# Patient Record
Sex: Female | Born: 1935 | Race: Black or African American | Hispanic: No | Smoking: Former smoker
Health system: Southern US, Community
[De-identification: ages and names within clinical notes are randomized; demographics above are authoritative.]

## PROBLEM LIST (undated history)

## (undated) DIAGNOSIS — M1990 Unspecified osteoarthritis, unspecified site: Secondary | ICD-10-CM

## (undated) DIAGNOSIS — M2040 Other hammer toe(s) (acquired), unspecified foot: Secondary | ICD-10-CM

## (undated) DIAGNOSIS — I493 Ventricular premature depolarization: Secondary | ICD-10-CM

## (undated) DIAGNOSIS — I10 Essential (primary) hypertension: Secondary | ICD-10-CM

## (undated) DIAGNOSIS — N952 Postmenopausal atrophic vaginitis: Secondary | ICD-10-CM

## (undated) DIAGNOSIS — N183 Chronic kidney disease, stage 3 unspecified: Secondary | ICD-10-CM

## (undated) DIAGNOSIS — K5900 Constipation, unspecified: Secondary | ICD-10-CM

## (undated) DIAGNOSIS — G609 Hereditary and idiopathic neuropathy, unspecified: Secondary | ICD-10-CM

## (undated) DIAGNOSIS — E785 Hyperlipidemia, unspecified: Secondary | ICD-10-CM

## (undated) DIAGNOSIS — N289 Disorder of kidney and ureter, unspecified: Secondary | ICD-10-CM

## (undated) DIAGNOSIS — I4510 Unspecified right bundle-branch block: Secondary | ICD-10-CM

## (undated) DIAGNOSIS — K2100 Gastro-esophageal reflux disease with esophagitis, without bleeding: Secondary | ICD-10-CM

## (undated) DIAGNOSIS — M949 Disorder of cartilage, unspecified: Secondary | ICD-10-CM

## (undated) DIAGNOSIS — M4010 Other secondary kyphosis, site unspecified: Secondary | ICD-10-CM

## (undated) DIAGNOSIS — D649 Anemia, unspecified: Secondary | ICD-10-CM

## (undated) DIAGNOSIS — M899 Disorder of bone, unspecified: Secondary | ICD-10-CM

## (undated) DIAGNOSIS — E669 Obesity, unspecified: Secondary | ICD-10-CM

## (undated) DIAGNOSIS — N189 Chronic kidney disease, unspecified: Secondary | ICD-10-CM

## (undated) DIAGNOSIS — N8111 Cystocele, midline: Secondary | ICD-10-CM

## (undated) DIAGNOSIS — E119 Type 2 diabetes mellitus without complications: Secondary | ICD-10-CM

## (undated) DIAGNOSIS — R630 Anorexia: Secondary | ICD-10-CM

## (undated) DIAGNOSIS — R32 Unspecified urinary incontinence: Secondary | ICD-10-CM

## (undated) HISTORY — DX: Other hammer toe(s) (acquired), unspecified foot: M20.40

## (undated) HISTORY — DX: Constipation, unspecified: K59.00

## (undated) HISTORY — DX: Chronic kidney disease, stage 3 (moderate): N18.3

## (undated) HISTORY — DX: Essential (primary) hypertension: I10

## (undated) HISTORY — DX: Chronic kidney disease, unspecified: N18.9

## (undated) HISTORY — DX: Unspecified osteoarthritis, unspecified site: M19.90

## (undated) HISTORY — DX: Unspecified right bundle-branch block: I45.10

## (undated) HISTORY — PX: TUBAL LIGATION: SHX77

## (undated) HISTORY — DX: Unspecified urinary incontinence: R32

## (undated) HISTORY — DX: Cystocele, midline: N81.11

## (undated) HISTORY — DX: Anorexia: R63.0

## (undated) HISTORY — DX: Hyperlipidemia, unspecified: E78.5

## (undated) HISTORY — DX: Disorder of bone, unspecified: M89.9

## (undated) HISTORY — DX: Gastro-esophageal reflux disease with esophagitis: K21.0

## (undated) HISTORY — DX: Gastro-esophageal reflux disease with esophagitis, without bleeding: K21.00

## (undated) HISTORY — DX: Postmenopausal atrophic vaginitis: N95.2

## (undated) HISTORY — DX: Chronic kidney disease, stage 3 unspecified: N18.30

## (undated) HISTORY — DX: Anemia, unspecified: D64.9

## (undated) HISTORY — DX: Ventricular premature depolarization: I49.3

## (undated) HISTORY — DX: Other secondary kyphosis, site unspecified: M40.10

## (undated) HISTORY — DX: Hereditary and idiopathic neuropathy, unspecified: G60.9

## (undated) HISTORY — DX: Obesity, unspecified: E66.9

## (undated) HISTORY — PX: BLADDER SURGERY: SHX569

## (undated) HISTORY — DX: Type 2 diabetes mellitus without complications: E11.9

## (undated) HISTORY — DX: Disorder of cartilage, unspecified: M94.9

## (undated) HISTORY — DX: Disorder of kidney and ureter, unspecified: N28.9

---

## 1999-02-16 ENCOUNTER — Other Ambulatory Visit: Admission: RE | Admit: 1999-02-16 | Discharge: 1999-02-16 | Payer: Self-pay | Admitting: *Deleted

## 2000-02-29 ENCOUNTER — Other Ambulatory Visit: Admission: RE | Admit: 2000-02-29 | Discharge: 2000-02-29 | Payer: Self-pay | Admitting: *Deleted

## 2002-12-30 ENCOUNTER — Other Ambulatory Visit: Admission: RE | Admit: 2002-12-30 | Discharge: 2002-12-30 | Payer: Self-pay | Admitting: Obstetrics & Gynecology

## 2003-12-06 ENCOUNTER — Inpatient Hospital Stay (HOSPITAL_COMMUNITY): Admission: EM | Admit: 2003-12-06 | Discharge: 2003-12-08 | Payer: Self-pay | Admitting: Emergency Medicine

## 2004-01-21 ENCOUNTER — Other Ambulatory Visit: Admission: RE | Admit: 2004-01-21 | Discharge: 2004-01-21 | Payer: Self-pay | Admitting: Obstetrics & Gynecology

## 2013-03-07 ENCOUNTER — Other Ambulatory Visit: Payer: Self-pay | Admitting: *Deleted

## 2013-03-07 DIAGNOSIS — I10 Essential (primary) hypertension: Secondary | ICD-10-CM

## 2013-03-07 DIAGNOSIS — E119 Type 2 diabetes mellitus without complications: Secondary | ICD-10-CM

## 2013-03-15 ENCOUNTER — Other Ambulatory Visit: Payer: Self-pay | Admitting: Nephrology

## 2013-03-18 ENCOUNTER — Other Ambulatory Visit: Payer: Medicare Other

## 2013-03-18 DIAGNOSIS — I10 Essential (primary) hypertension: Secondary | ICD-10-CM

## 2013-03-18 DIAGNOSIS — N183 Chronic kidney disease, stage 3 unspecified: Secondary | ICD-10-CM

## 2013-03-18 DIAGNOSIS — E119 Type 2 diabetes mellitus without complications: Secondary | ICD-10-CM

## 2013-03-19 LAB — CBC WITH DIFFERENTIAL/PLATELET
Basophils Absolute: 0 10*3/uL (ref 0.0–0.2)
Basos: 1 % (ref 0–3)
Eos: 3 % (ref 0–5)
Eosinophils Absolute: 0.2 10*3/uL (ref 0.0–0.4)
HCT: 33.1 % — ABNORMAL LOW (ref 34.0–46.6)
Hemoglobin: 11 g/dL — ABNORMAL LOW (ref 11.1–15.9)
Immature Grans (Abs): 0 10*3/uL (ref 0.0–0.1)
Immature Granulocytes: 0 % (ref 0–2)
Lymphocytes Absolute: 1.4 10*3/uL (ref 0.7–3.1)
Lymphs: 19 % (ref 14–46)
MCH: 27.4 pg (ref 26.6–33.0)
MCHC: 33.2 g/dL (ref 31.5–35.7)
MCV: 82 fL (ref 79–97)
Monocytes Absolute: 0.4 10*3/uL (ref 0.1–0.9)
Monocytes: 6 % (ref 4–12)
Neutrophils Absolute: 5.2 10*3/uL (ref 1.4–7.0)
Neutrophils Relative %: 71 % (ref 40–74)
RBC: 4.02 x10E6/uL (ref 3.77–5.28)
RDW: 14.9 % (ref 12.3–15.4)
WBC: 7.3 10*3/uL (ref 3.4–10.8)

## 2013-03-19 LAB — LIPID PANEL
Chol/HDL Ratio: 3 ratio units (ref 0.0–4.4)
Cholesterol, Total: 140 mg/dL (ref 100–199)
HDL: 46 mg/dL (ref >39)
LDL Calculated: 74 mg/dL (ref 0–99)
Triglycerides: 101 mg/dL (ref 0–149)
VLDL Cholesterol Cal: 20 mg/dL (ref 5–40)

## 2013-03-19 LAB — COMPREHENSIVE METABOLIC PANEL
ALT: 16 IU/L (ref 0–32)
AST: 23 IU/L (ref 0–40)
Albumin/Globulin Ratio: 1.4 (ref 1.1–2.5)
Albumin: 3.9 g/dL (ref 3.5–4.7)
Alkaline Phosphatase: 90 IU/L (ref 39–117)
BUN/Creatinine Ratio: 14 (ref 11–26)
BUN: 18 mg/dL (ref 8–27)
CO2: 24 mmol/L (ref 19–28)
Calcium: 9.3 mg/dL (ref 8.6–10.2)
Chloride: 103 mmol/L (ref 97–108)
Creatinine, Ser: 1.26 mg/dL — ABNORMAL HIGH (ref 0.57–1.00)
GFR calc Af Amer: 46 mL/min/{1.73_m2} — ABNORMAL LOW (ref >59)
GFR calc non Af Amer: 39 mL/min/{1.73_m2} — ABNORMAL LOW (ref >59)
Globulin, Total: 2.7 g/dL (ref 1.5–4.5)
Glucose: 100 mg/dL — ABNORMAL HIGH (ref 65–99)
Potassium: 4.4 mmol/L (ref 3.5–5.2)
Sodium: 141 mmol/L (ref 134–144)
Total Bilirubin: 0.3 mg/dL (ref 0.0–1.2)
Total Protein: 6.6 g/dL (ref 6.0–8.5)

## 2013-03-19 LAB — HEMOGLOBIN A1C
Est. average glucose Bld gHb Est-mCnc: 151 mg/dL
Hgb A1c MFr Bld: 6.9 % — ABNORMAL HIGH (ref 4.8–5.6)

## 2013-03-20 ENCOUNTER — Encounter: Payer: Self-pay | Admitting: Internal Medicine

## 2013-03-20 ENCOUNTER — Ambulatory Visit (INDEPENDENT_AMBULATORY_CARE_PROVIDER_SITE_OTHER): Payer: Medicare Other | Admitting: Internal Medicine

## 2013-03-20 VITALS — BP 130/78 | HR 82 | Temp 98.0°F | Resp 20 | Ht 66.0 in | Wt 177.0 lb

## 2013-03-20 DIAGNOSIS — M1990 Unspecified osteoarthritis, unspecified site: Secondary | ICD-10-CM

## 2013-03-20 DIAGNOSIS — E119 Type 2 diabetes mellitus without complications: Secondary | ICD-10-CM

## 2013-03-20 DIAGNOSIS — Z0000 Encounter for general adult medical examination without abnormal findings: Secondary | ICD-10-CM

## 2013-03-20 DIAGNOSIS — N189 Chronic kidney disease, unspecified: Secondary | ICD-10-CM | POA: Insufficient documentation

## 2013-03-20 DIAGNOSIS — E785 Hyperlipidemia, unspecified: Secondary | ICD-10-CM

## 2013-03-20 DIAGNOSIS — I10 Essential (primary) hypertension: Secondary | ICD-10-CM

## 2013-03-20 MED ORDER — SIMVASTATIN 5 MG PO TABS: 5.0000 mg | ORAL_TABLET | Freq: Every day | ORAL | Status: DC

## 2013-03-20 MED ORDER — POLYETHYLENE GLYCOL 3350 17 GM/SCOOP PO POWD: 17.0000 g | Freq: Every day | ORAL | Status: DC | PRN

## 2013-03-20 NOTE — Patient Instructions (Signed)
Your cholesterol medication dose has been decreased to 5 mg daily Stop taking tradjenta Taking miralax daily as needed for constipation Get blood work done 1 week prior to next lab work

## 2013-03-20 NOTE — Progress Notes (Signed)
Subjective:    Patient ID: Dawn [name], female    DOB: [birth year], 79 y.o.   MRN: [id number]  Chief Complaint  Patient presents with  . Annual Exam  . Hypertension  . Diabetes Mellitus   HPI  Pt here for her routine visit. Her cbg at home has been between 60-130. Denies any hypoglycemic episodes. Her bp has been under control. Has been seen by renal and pending renal usg. She had uti and is on last day on amoxicillin. Currently asymptomatic.  Has been having problem with bowel movement. On senna-s once a day Compliant with her medications Does not want any further pelvic and rectal exam or mammogram uptodate with immunization  Review of Systems  Constitutional: Negative for fever, appetite change and fatigue.  HENT: Negative for congestion.   Eyes: Negative for visual disturbance.  Respiratory: Negative for cough and shortness of breath.   Cardiovascular: Negative for chest pain, palpitations and leg swelling.  Gastrointestinal: Positive for constipation. Negative for abdominal pain and blood in stool.  Endocrine: Negative for cold intolerance, polydipsia, polyphagia and polyuria.  Genitourinary: Negative for dysuria, frequency and flank pain.  Musculoskeletal: Positive for arthralgias.  Skin: Negative for rash.  Neurological: Negative for dizziness, syncope, weakness and numbness.  Hematological: Negative for adenopathy.  Psychiatric/Behavioral: Negative for behavioral problems.   Allergies  Allergen Reactions  . Keflex (Cephalexin)   . Metronidazole    Past Medical History  Diagnosis Date  . Unspecified constipation   . Chronic kidney disease, stage III (moderate)   . Disorder of bone and cartilage, unspecified   . Other hammer toe (acquired)   . Anorexia   . Postmenopausal atrophic vaginitis   . Anemia, unspecified   . Reflux esophagitis   . Unspecified disorder of kidney and ureter   . Osteoarthrosis, unspecified whether generalized or localized, unspecified  site   . Other and unspecified hyperlipidemia   . Cystocele, midline   . Unspecified essential hypertension   . Type II or unspecified type diabetes mellitus without mention of complication, not stated as uncontrolled   . Unspecified urinary incontinence   . Unspecified hereditary and idiopathic peripheral neuropathy   . Obesity, unspecified   . Kyphosis associated with other condition   . Chronic kidney disease    Family History  Problem Relation Age of Onset  . Heart disease Mother   . Heart disease Sister   . Cancer Son   . Cancer Son    History   Social History  . Marital Status: Widowed    Spouse Name: N/A    Number of Children: N/A  . Years of Education: N/A   Occupational History  . Not on file.   Social History Main Topics  . Smoking status: Never Smoker   . Smokeless tobacco: Not on file  . Alcohol Use: No  . Drug Use: No  . Sexually Active: No   Other Topics Concern  . Not on file   Social History Narrative  . No narrative on file       Objective:   Physical Exam  Constitutional: She is oriented to person, place, and time. She appears well-developed and well-nourished. No distress.  HENT:  Head: Normocephalic and atraumatic.  Right Ear: External ear normal.  Left Ear: External ear normal.  Mouth/Throat: Oropharynx is clear and moist. No oropharyngeal exudate.  Eyes: Conjunctivae and EOM are normal. Pupils are equal, round, and reactive to light.  Neck: Normal range of motion. Neck supple. No thyromegaly  present.  Cardiovascular: Normal rate, regular rhythm, normal heart sounds and intact distal pulses.   Pulmonary/Chest: Effort normal and breath sounds normal.  Abdominal: Soft. Bowel sounds are normal. There is no tenderness.  Musculoskeletal: Normal range of motion. She exhibits no edema.  Lymphadenopathy:    She has no cervical adenopathy.  Neurological: She is alert and oriented to person, place, and time. She has normal reflexes. No cranial  nerve deficit.  Skin: Skin is warm and dry. No rash noted. She is not diaphoretic. No erythema.  Psychiatric: She has a normal mood and affect. Her behavior is normal.    BP 130/78  Pulse 82  Temp(Src) 98 F (36.7 C) (Oral)  Resp 20  Ht 5\' 6"  (1.676 m)  Wt 177 lb (80.287 kg)  BMI 28.58 kg/m2  SpO2 98%  LABS- Reviewed ekg, sinus rhythm, RBBB- same from 2009  CBC    Component Value Date/Time   WBC 7.3 03/18/2013 1003   RBC 4.02 03/18/2013 1003   HGB 11.0* 03/18/2013 1003   HCT 33.1* 03/18/2013 1003   MCV 82 03/18/2013 1003   MCH 27.4 03/18/2013 1003   MCHC 33.2 03/18/2013 1003   RDW 14.9 03/18/2013 1003   LYMPHSABS 1.4 03/18/2013 1003   EOSABS 0.2 03/18/2013 1003   BASOSABS 0.0 03/18/2013 1003   CMP     Component Value Date/Time   NA 141 03/18/2013 1003   K 4.4 03/18/2013 1003   CL 103 03/18/2013 1003   CO2 24 03/18/2013 1003   GLUCOSE 100* 03/18/2013 1003   BUN 18 03/18/2013 1003   CREATININE 1.26* 03/18/2013 1003   CALCIUM 9.3 03/18/2013 1003   PROT 6.6 03/18/2013 1003   AST 23 03/18/2013 1003   ALT 16 03/18/2013 1003   ALKPHOS 90 03/18/2013 1003   BILITOT 0.3 03/18/2013 1003   GFRNONAA 39* 03/18/2013 1003   GFRAA 46* 03/18/2013 1003      Assessment & Plan:   Chronic kidney disease- followed by renal, her longstanding htn and dm are likely cause. Has renal usg pending. Need to review renal notes post procedure  HTN- bp well controlled with current regimen. Monitor bp. No new meds changes this visit  DM- reviewed recent a1c and home cbg reading. Will disocntinue tradjenta for now. Continue cbg monitor. Recheck a1c prior to next visit. Goal a1c < 7 for her with her age. Continue asa, simvastatin and ACEI with glipizide  Constipation- continue senna-s once a day and will add miralax once a day as needed. Encouraged fiber intake and water consumption  Hyperlipidemia- lipid panel reviewed and ldl at goal now for several months. Will decrease simvastatin to 5 mg daily and maintain her on this dose.  Annual  physical- uptodate with flu and pneumococcal vaccine. No further mammogram or pelvic exam. Last mammogram 2013 normal.

## 2013-03-21 ENCOUNTER — Telehealth: Payer: Self-pay | Admitting: *Deleted

## 2013-03-21 DIAGNOSIS — E1121 Type 2 diabetes mellitus with diabetic nephropathy: Secondary | ICD-10-CM | POA: Insufficient documentation

## 2013-03-21 NOTE — Telephone Encounter (Signed)
Dawn [name]

## 2013-03-27 ENCOUNTER — Ambulatory Visit
Admission: RE | Admit: 2013-03-27 | Discharge: 2013-03-27 | Disposition: A | Discharge status: Home / Self Care | Payer: Medicare Other | Source: Ambulatory Visit | Attending: Nephrology | Admitting: Nephrology

## 2013-04-25 ENCOUNTER — Encounter: Payer: Self-pay | Admitting: Internal Medicine

## 2013-06-12 ENCOUNTER — Other Ambulatory Visit: Payer: Medicare Other

## 2013-06-12 ENCOUNTER — Other Ambulatory Visit: Payer: Self-pay | Admitting: Geriatric Medicine

## 2013-06-12 DIAGNOSIS — R350 Frequency of micturition: Secondary | ICD-10-CM

## 2013-06-12 DIAGNOSIS — I10 Essential (primary) hypertension: Secondary | ICD-10-CM

## 2013-06-12 DIAGNOSIS — E119 Type 2 diabetes mellitus without complications: Secondary | ICD-10-CM

## 2013-06-13 ENCOUNTER — Other Ambulatory Visit: Payer: Self-pay | Admitting: Geriatric Medicine

## 2013-06-13 LAB — BASIC METABOLIC PANEL
BUN/Creatinine Ratio: 17 (ref 11–26)
BUN: 21 mg/dL (ref 8–27)
CO2: 24 mmol/L (ref 18–29)
Calcium: 9.3 mg/dL (ref 8.6–10.2)
Chloride: 105 mmol/L (ref 97–108)
Creatinine, Ser: 1.23 mg/dL — ABNORMAL HIGH (ref 0.57–1.00)
GFR calc Af Amer: 47 mL/min/{1.73_m2} — ABNORMAL LOW (ref >59)
GFR calc non Af Amer: 41 mL/min/{1.73_m2} — ABNORMAL LOW (ref >59)
Glucose: 114 mg/dL — ABNORMAL HIGH (ref 65–99)
Potassium: 4.4 mmol/L (ref 3.5–5.2)
Sodium: 143 mmol/L (ref 134–144)

## 2013-06-13 LAB — URINALYSIS
Bilirubin, UA: NEGATIVE
Glucose, UA: NEGATIVE
Ketones, UA: NEGATIVE
Nitrite, UA: POSITIVE — AB
Protein, UA: NEGATIVE
Specific Gravity, UA: 1.016 (ref 1.005–1.030)
Urobilinogen, Ur: 0.2 mg/dL (ref 0.0–1.9)
pH, UA: 7 (ref 5.0–7.5)

## 2013-06-13 LAB — HEMOGLOBIN A1C
Est. average glucose Bld gHb Est-mCnc: 157 mg/dL
Hgb A1c MFr Bld: 7.1 % — ABNORMAL HIGH (ref 4.8–5.6)

## 2013-06-13 MED ORDER — SULFAMETHOXAZOLE-TRIMETHOPRIM 800-160 MG PO TABS: 1.0000 | ORAL_TABLET | Freq: Two times a day (BID) | ORAL | Status: DC

## 2013-06-14 LAB — URINE CULTURE

## 2013-06-18 ENCOUNTER — Encounter: Payer: Self-pay | Admitting: *Deleted

## 2013-06-18 ENCOUNTER — Other Ambulatory Visit: Payer: Self-pay | Admitting: Geriatric Medicine

## 2013-06-18 NOTE — Progress Notes (Signed)
Patient is taking Bactrim and will eat yogurt daily. Her blood sugar has been running low. Per Hassell Done, eat plenty of snacks throughout the day, skip insulin tonight, check blood sugar in the morning, and follow up with Dr. Bubba Camp during office visit tomorrow at 1:00.

## 2013-06-19 ENCOUNTER — Encounter: Payer: Self-pay | Admitting: Internal Medicine

## 2013-06-19 ENCOUNTER — Ambulatory Visit (INDEPENDENT_AMBULATORY_CARE_PROVIDER_SITE_OTHER): Payer: Medicare Other | Admitting: Internal Medicine

## 2013-06-19 VITALS — BP 134/72 | HR 73 | Temp 98.3°F | Resp 14 | Ht 66.0 in | Wt 174.4 lb

## 2013-06-19 DIAGNOSIS — M949 Disorder of cartilage, unspecified: Secondary | ICD-10-CM

## 2013-06-19 DIAGNOSIS — E1121 Type 2 diabetes mellitus with diabetic nephropathy: Secondary | ICD-10-CM

## 2013-06-19 DIAGNOSIS — N058 Unspecified nephritic syndrome with other morphologic changes: Secondary | ICD-10-CM

## 2013-06-19 DIAGNOSIS — M899 Disorder of bone, unspecified: Secondary | ICD-10-CM

## 2013-06-19 DIAGNOSIS — E119 Type 2 diabetes mellitus without complications: Secondary | ICD-10-CM

## 2013-06-19 DIAGNOSIS — I10 Essential (primary) hypertension: Secondary | ICD-10-CM

## 2013-06-19 DIAGNOSIS — E1129 Type 2 diabetes mellitus with other diabetic kidney complication: Secondary | ICD-10-CM

## 2013-06-19 DIAGNOSIS — E785 Hyperlipidemia, unspecified: Secondary | ICD-10-CM

## 2013-06-19 DIAGNOSIS — M1990 Unspecified osteoarthritis, unspecified site: Secondary | ICD-10-CM

## 2013-06-19 DIAGNOSIS — N390 Urinary tract infection, site not specified: Secondary | ICD-10-CM | POA: Insufficient documentation

## 2013-06-19 DIAGNOSIS — E162 Hypoglycemia, unspecified: Secondary | ICD-10-CM

## 2013-06-19 MED ORDER — SIMVASTATIN 5 MG PO TABS: 5.0000 mg | ORAL_TABLET | Freq: Every day | ORAL | Status: DC

## 2013-06-19 MED ORDER — GLIPIZIDE 5 MG PO TABS: 5.0000 mg | ORAL_TABLET | Freq: Two times a day (BID) | ORAL | Status: DC

## 2013-06-19 NOTE — Patient Instructions (Signed)
Please read this  If your sugar readings (check twice a day) are persistently < 70, please notify the office. Also check your blood pressure reading once a week and bring it to the office for review  Complete your course of antibiotic for your urinary tract infection    Hypoglycemia (Low Blood Sugar) Hypoglycemia is when the glucose (sugar) in your blood is too low. Hypoglycemia can happen for many reasons. It can happen to people with or without diabetes. Hypoglycemia can develop quickly and can be a medical emergency.  CAUSES  Having hypoglycemia does not mean that you will develop diabetes. Different causes include:  Missed or delayed meals or not enough carbohydrates eaten.  Medication overdose. This could be by accident or deliberate. If by accident, your medication may need to be adjusted or changed.  Exercise or increased activity without adjustments in carbohydrates or medications.  A nerve disorder that affects body functions like your heart rate, blood pressure and digestion (autonomic neuropathy).  A condition where the stomach muscles do not function properly (gastroparesis). Therefore, medications may not absorb properly.  The inability to recognize the signs of hypoglycemia (hypoglycemic unawareness).  Absorption of insulin  may be altered.  Alcohol consumption.  Pregnancy/menstrual cycles/postpartum. This may be due to hormones.  Certain kinds of tumors. This is very rare. SYMPTOMS   Sweating.  Hunger.  Dizziness.  Blurred vision.  Drowsiness.  Weakness.  Headache.  Rapid heart beat.  Shakiness.  Nervousness. DIAGNOSIS  Diagnosis is made by monitoring blood glucose in one or all of the following ways:  Fingerstick blood glucose monitoring.  Laboratory results. TREATMENT  If you think your blood glucose is low:  Check your blood glucose, if possible. If it is less than 70 mg/dl, take one of the following:  3-4 glucose tablets.   cup  juice (prefer clear like apple).   cup "regular" soda pop.  1 cup milk.  -1 tube of glucose gel.  5-6 hard candies.  Do not over treat because your blood glucose (sugar) will only go too high.  Wait 15 minutes and recheck your blood glucose. If it is still less than 70 mg/dl (or below your target range), repeat treatment.  Eat a snack if it is more than one hour until your next meal. Sometimes, your blood glucose may go so low that you are unable to treat yourself. You may need someone to help you. You may even pass out or be unable to swallow. This may require you to get an injection of glucagon, which raises the blood glucose. HOME CARE INSTRUCTIONS  Check blood glucose as recommended by your caregiver.  Take medication as prescribed by your caregiver.  Follow your meal plan. Do not skip meals. Eat on time.  If you are going to drink alcohol, drink it only with meals.  Check your blood glucose before driving.  Check your blood glucose before and after exercise. If you exercise longer or different than usual, be sure to check blood glucose more frequently.  Always carry treatment with you. Glucose tablets are the easiest to carry.  Always wear medical alert jewelry or carry some form of identification that states that you have diabetes. This will alert people that you have diabetes. If you have hypoglycemia, they will have a better idea on what to do. SEEK MEDICAL CARE IF:   You are having problems keeping your blood sugar at target range.  You are having frequent episodes of hypoglycemia.  You feel you might  be having side effects from your medicines.  You have symptoms of an illness that is not improving after 3-4 days.  You notice a change in vision or a new problem with your vision. SEEK IMMEDIATE MEDICAL CARE IF:   You are a family member or friend of a person whose blood glucose goes below 70 mg/dl and is accompanied by:  Confusion.  A change in mental  status.  The inability to swallow.  Passing out. Document Released: 11/28/2005 Document Revised: 02/20/2012 Document Reviewed: 03/26/2012 [facility] Patient Information 2014 [place], [place].

## 2013-06-19 NOTE — Progress Notes (Signed)
Patient ID: Dawn [name], female   DOB: [birth year], 79 y.o.   MRN: [id number]  Chief Complaint  Patient presents with  . Medical Managment of Chronic Issues    Complains of blood sugar running low    HPI  She had a recent E.coli UTI and has been started on bactrim ds 1 tab every 12 hours with florastor and stopped it yesterday with her being incoherent. EMS was called and checked her cbg to be 54. This morning again it was 52 and 49. She was given orange juice and sugar candy and then she had her breakfast and her sugar came up to 93. She had self resumed tradjenta 2 month back cbg at home between 90-160 and mostly between 90-100 recently  Daughter is here with her today and would like to know about her bp and her weight  Reviewed renal usg and recent labs with patient  Bowel movement has improved  Review of Systems  Constitutional: Negative for fever, appetite change and fatigue.  HENT: Negative for congestion.   Eyes: Negative for visual disturbance.  Respiratory: Negative for cough and shortness of breath.   Cardiovascular: Negative for chest pain, palpitations and leg swelling.  Gastrointestinal: Positive for constipation. Negative for abdominal pain and blood in stool.  Endocrine: Negative for cold intolerance, polydipsia, polyphagia and polyuria.  Genitourinary: Negative for dysuria, frequency and flank pain.  Musculoskeletal: Positive for arthralgias.  Skin: Negative for rash.  Neurological: Negative for dizziness, syncope, weakness and numbness.  Hematological: Negative for adenopathy.  Psychiatric/Behavioral: Negative for behavioral problems.   BP 134/72  Pulse 73  Temp(Src) 98.3 F (36.8 C) (Oral)  Resp 14  Ht 5\' 6"  (1.676 m)  Wt 174 lb 6.4 oz (79.107 kg)  BMI 28.16 kg/m2  Constitutional: She is oriented to person, place, and time. She appears well-developed and well-nourished. No distress.  HENT:   Head: Normocephalic and atraumatic.  Right Ear: External ear  normal.  Left Ear: External ear normal.   Mouth/Throat: Oropharynx is clear and moist. No oropharyngeal exudate.  Eyes: Conjunctivae and EOM are normal. Pupils are equal, round, and reactive to light.  Neck: Normal range of motion. Neck supple. No thyromegaly present.  Cardiovascular: Normal rate, regular rhythm, normal heart sounds and intact distal pulses.   Pulmonary/Chest: Effort normal and breath sounds normal.  Abdominal: Soft. Bowel sounds are normal. There is no tenderness.  Musculoskeletal: Normal range of motion. She exhibits no edema.  Lymphadenopathy:    She has no cervical adenopathy.  Neurological: She is alert and oriented to person, place, and time. She has normal reflexes. No cranial nerve deficit.  Skin: Skin is warm and dry. No rash noted. She is not diaphoretic. No erythema.  Psychiatric: She has a normal mood and affect. Her behavior is normal.    IMAGINGS-  03/15/13  RENAL/URINARY TRACT ULTRASOUND COMPLETE   Comparison:  None.   Findings:   Right Kidney:  No hydronephrosis is seen.  The right kidney measures 9.1 cm sagittally.  The parenchyma of the right kidney is echogenic consistent with chronic renal medical disease.  A small cyst is noted peripherally in the mid right kidney of 1.3 cm in maximum diameter.   Left Kidney:  No hydronephrosis is noted.  The left kidney measures 9.2 cm.  The parenchyma is echogenic consistent with fatty infiltration.  Two small cysts are present measuring 2.0 and 1.6 cm respectively.   Bladder:  The urinary bladder is unremarkable.   IMPRESSION:  1.  No hydronephrosis. 2.  Echogenic renal parenchyma consistent with chronic renal medical disease. 3.  Small renal cysts bilaterally.   CMP     Component Value Date/Time   NA 143 06/12/2013 0940   K 4.4 06/12/2013 0940   CL 105 06/12/2013 0940   CO2 24 06/12/2013 0940   GLUCOSE 114* 06/12/2013 0940   BUN 21 06/12/2013 0940   CREATININE 1.23* 06/12/2013 0940   CALCIUM 9.3  06/12/2013 0940   PROT 6.6 03/18/2013 1003   AST 23 03/18/2013 1003   ALT 16 03/18/2013 1003   ALKPHOS 90 03/18/2013 1003   BILITOT 0.3 03/18/2013 1003   GFRNONAA 41* 06/12/2013 0940   GFRAA 47* 06/12/2013 0940   a1c 7.1  ASSESSMENT/PLAN  HTN- bp well controlled.  Currently has no symptoms. Continue lisinopril-hctz 20-12.5 for now. Monitor bp at home and bring reading for review  uti- resume her antibiotics and complete a week course of septra for now. Encourage hydration for now  Hyperlipidemia- continue zocor for now. Refills provided  Chronic kidney disease- followed by renal, her longstanding htn and dm are likely cause. Renal usg s/o chronic kidney disease changes. Avoid NSAIDs  DM- reviewed recent a1c and home cbg reading. Recent hypoglycemia. Will stop tradjenta for now.  Continue cbg monitor. Goal a1c < 7 for her with her age. Continue asa, simvastatin and ACEI with glipizide  Constipation- continue senna-s once a day and miralax once a day as needed. Encouraged fiber intake and water consumption  Hyperlipidemia- lipid panel reviewed and ldl at goal now for several months. Will decrease simvastatin to 5 mg daily and maintain her on this dose.  OA- continue calcium vit d supplement. Will get her dexa scan

## 2013-06-20 ENCOUNTER — Other Ambulatory Visit: Payer: Self-pay | Admitting: Nurse Practitioner

## 2013-07-03 ENCOUNTER — Other Ambulatory Visit: Payer: Self-pay

## 2013-07-08 ENCOUNTER — Ambulatory Visit: Payer: Self-pay | Admitting: Pharmacotherapy

## 2013-07-23 ENCOUNTER — Other Ambulatory Visit: Payer: Self-pay | Admitting: Geriatric Medicine

## 2013-07-23 ENCOUNTER — Ambulatory Visit (INDEPENDENT_AMBULATORY_CARE_PROVIDER_SITE_OTHER): Payer: Medicare Other | Admitting: Internal Medicine

## 2013-07-23 ENCOUNTER — Encounter: Payer: Self-pay | Admitting: Internal Medicine

## 2013-07-23 VITALS — BP 124/80 | HR 75 | Temp 97.9°F | Resp 14 | Ht 66.0 in | Wt 176.6 lb

## 2013-07-23 DIAGNOSIS — M159 Polyosteoarthritis, unspecified: Secondary | ICD-10-CM | POA: Insufficient documentation

## 2013-07-23 DIAGNOSIS — E785 Hyperlipidemia, unspecified: Secondary | ICD-10-CM

## 2013-07-23 DIAGNOSIS — E1121 Type 2 diabetes mellitus with diabetic nephropathy: Secondary | ICD-10-CM

## 2013-07-23 DIAGNOSIS — E1129 Type 2 diabetes mellitus with other diabetic kidney complication: Secondary | ICD-10-CM

## 2013-07-23 DIAGNOSIS — N058 Unspecified nephritic syndrome with other morphologic changes: Secondary | ICD-10-CM

## 2013-07-23 DIAGNOSIS — I10 Essential (primary) hypertension: Secondary | ICD-10-CM

## 2013-07-23 MED ORDER — LISINOPRIL-HYDROCHLOROTHIAZIDE 20-12.5 MG PO TABS: 1.0000 | ORAL_TABLET | Freq: Every day | ORAL | Status: DC

## 2013-07-23 MED ORDER — TRAMADOL HCL 50 MG PO TABS: 50.0000 mg | ORAL_TABLET | Freq: Three times a day (TID) | ORAL | Status: DC | PRN

## 2013-07-23 MED ORDER — DESOXIMETASONE 0.05 % EX CREA: TOPICAL_CREAM | Freq: Two times a day (BID) | CUTANEOUS | Status: DC

## 2013-07-23 NOTE — Progress Notes (Signed)
Patient ID: Dawn [name], female   DOB: May 31, 1928, 79 y.o.   MRN: [id number]  Chief Complaint  Patient presents with  . Medical Managment of Chronic Issues   HPI- Pain in her bones and achiness in joints for few weeks Has stifness in her joints and feels she is moving slower. She feels like she could have a fall at times Taking ca-vit d supplement Denies dizziness or lightheadedness  bp appears stable  Bowel movement has been regular- improved from before. Takes senna and prune juice  Taking her glipizide. Blood sugar this am was 123. Yesterday was 95 in am and 208 in pm. Tolerating her medication well  Allergies  Allergen Reactions  . Keflex [Cephalexin]   . Metronidazole    ROS Denies fever or chills Denies nausea and vomiting Denies abdominal pain Denies any falls Denies numbness or tingling  BP 124/80  Pulse 75  Temp(Src) 97.9 F (36.6 C) (Oral)  Resp 14  Ht 5\' 6"  (1.676 m)  Wt 176 lb 9.6 oz (80.105 kg)  BMI 28.52 kg/m2  Constitutional: She is oriented to person, place, and time. She appears well-developed and well-nourished. No distress.   HENT:   Head: Normocephalic and atraumatic.  Neck: Normal range of motion. Neck supple. No thyromegaly present.   Cardiovascular: Normal rate, regular rhythm, normal heart sounds and intact distal pulses.    Pulmonary/Chest: Effort normal and breath sounds normal.   Abdominal: Soft. Bowel sounds are normal. There is no tenderness.  Musculoskeletal: She exhibits no edema. Wrist joint and should joint ROM limited with pain Lymphadenopathy:    She has no cervical adenopathy.  Neurological: She is alert and oriented to person, place, and time. She has normal reflexes. No cranial nerve deficit.   Skin: Skin is warm and dry. No rash noted. She is not diaphoretic. No erythema.  Psychiatric: She has a normal mood and affect. Her behavior is normal.    Assessment/plan  Generalized OA- will provide PT referral to help with ROM  exercises for now. Will have her on tramadol 50 mg every 8 hr as needed for pain  HTN- bp well controlled. Continue lisinopril-hctz 20-12.5 for now.   Hyperlipidemia- continue zocor for now  DM- Continue cbg monitor. Goal a1c < 7 for her with her age. Continue asa, simvastatin and ACEI with glipizide  Constipation- continue senna-s once a day and miralax once a day as needed. Encouraged fiber intake and water consumption

## 2013-09-20 ENCOUNTER — Encounter: Payer: Self-pay | Admitting: Podiatrist

## 2013-09-20 ENCOUNTER — Ambulatory Visit (INDEPENDENT_AMBULATORY_CARE_PROVIDER_SITE_OTHER): Payer: Medicare Other | Admitting: Podiatrist

## 2013-09-20 VITALS — BP 94/59 | HR 84 | Resp 20 | Ht 66.0 in

## 2013-09-20 DIAGNOSIS — E1149 Type 2 diabetes mellitus with other diabetic neurological complication: Secondary | ICD-10-CM

## 2013-09-20 DIAGNOSIS — B351 Tinea unguium: Secondary | ICD-10-CM

## 2013-09-20 DIAGNOSIS — E1142 Type 2 diabetes mellitus with diabetic polyneuropathy: Secondary | ICD-10-CM

## 2013-09-20 DIAGNOSIS — E1140 Type 2 diabetes mellitus with diabetic neuropathy, unspecified: Secondary | ICD-10-CM

## 2013-09-20 DIAGNOSIS — M79609 Pain in unspecified limb: Secondary | ICD-10-CM

## 2013-09-20 NOTE — Progress Notes (Signed)
MRN: [id number] Name: Dawn [name]  Sex: female Age: 79 y.o. DOB: 07-14-1928  Provider: Trudie Buckler P  Allergies: Keflex and Metronidazole   Chief Complaint  Patient presents with  . Nail Problem    "It's a follow up of my toenails.  My right big toe feels like it's ingrown."     HPI: Patient is 79 y.o. female who presents today for of diabetic foot and nail care. Patient states she's doing well however her right great toenail feels like it's ingrown. She also states she's unable to wear her diabetic shoes as it feels like it makes the right great toenail uncomfortable and tight  Past Medical History  Diagnosis Date  . Unspecified constipation   . Chronic kidney disease, stage III (moderate)   . Disorder of bone and cartilage, unspecified   . Other hammer toe (acquired)   . Anorexia   . Postmenopausal atrophic vaginitis   . Anemia, unspecified   . Reflux esophagitis   . Unspecified disorder of kidney and ureter   . Osteoarthrosis, unspecified whether generalized or localized, unspecified site   . Other and unspecified hyperlipidemia   . Cystocele, midline   . Unspecified essential hypertension   . Type II or unspecified type diabetes mellitus without mention of complication, not stated as uncontrolled   . Unspecified urinary incontinence   . Unspecified hereditary and idiopathic peripheral neuropathy   . Obesity, unspecified   . Kyphosis associated with other condition   . Chronic kidney disease        Medication List       This list is accurate as of: 09/20/13  5:14 PM.  Always use your most recent med list.               aspirin 81 MG tablet  Take 81 mg by mouth daily.     calcium-vitamin D 500-200 MG-UNIT per tablet  Commonly known as:  OSCAL WITH D  Take 1 tablet by mouth daily.     cholecalciferol 1000 UNITS tablet  Commonly known as:  VITAMIN D  Take 1,000 Units by mouth daily.     desoximetasone 0.05 % cream  Commonly known as:  TOPICORT   Apply topically 2 (two) times daily.     glipiZIDE 5 MG tablet  Commonly known as:  GLUCOTROL  TAKE 1 TABLET TWICE A DAY     lisinopril-hydrochlorothiazide 20-12.5 MG per tablet  Commonly known as:  PRINZIDE,ZESTORETIC  Take 1 tablet by mouth daily.     polyethylene glycol powder powder  Commonly known as:  GLYCOLAX/MIRALAX  Take 17 g by mouth daily as needed (constipation).     senna 8.6 MG Tabs tablet  Commonly known as:  SENOKOT  Take 1 tablet by mouth daily.     simvastatin 5 MG tablet  Commonly known as:  ZOCOR  Take 1 tablet (5 mg total) by mouth at bedtime.     traMADol 50 MG tablet  Commonly known as:  ULTRAM  Take 1 tablet (50 mg total) by mouth every 8 (eight) hours as needed for pain.     vitamin E 400 UNIT capsule  Take 400 Units by mouth daily.        No orders of the defined types were placed in this encounter.    History reviewed. No pertinent past surgical history.   History  Substance Use Topics  . Smoking status: Former Research scientist (life sciences)  . Smokeless tobacco: Not on file     Comment: quit  in 1970  . Alcohol Use: No    Family History  Problem Relation Age of Onset  . Heart disease Mother   . Heart disease Sister   . Cancer Son   . Cancer Son      Danley Danker Vitals:   09/20/13 1537  BP: 94/59  Pulse: 84  Resp: 20    Physical Exam  GENERAL APPEARANCE: Alert, conversant. Appropriately groomed. No acute distress.  VASCULAR: Pedal pulses palpable bilateral.  Capillary refill time is immediate to all digits,  Proximal to distal cooling it warm to warm.  Digital hair growth is present bilateral  NEUROLOGIC: sensation is decreased epicritically and protectively to 5.07 monofilament at 3/5 sites bilateral.  Light touch is decreased bilateral, vibratory sensation decreased bilateral MUSCULOSKELETAL: acceptable muscle strength, tone and stability bilateral.forefoot cavus deformity bilateral DERMATOLOGIC: skin color, texture, and turger are decreased.  No  preulcerative lesions are seen, no interdigital maceration noted.  No open lesions present.  Digital nails are symptomatic and painful 1 through 5 bilateral right hallux nail is incurvated on the medial nail border no redness no swelling no infection present the remainder of the nails are thick and discolored dystrophic elongated and clinically mycotic.   Assessment   Diabetes with painful mycotic nails  Plan    Debrided the patient's nails to the level of the nail bed without complication did a slant back procedure of the right hallux nail without anesthesia to be seen back in 3 months or as needed for followup.  Trudie Buckler DPM   Dawn [name] P, DPM

## 2013-09-20 NOTE — Patient Instructions (Signed)

## 2013-09-30 ENCOUNTER — Other Ambulatory Visit: Payer: Medicare Other

## 2013-09-30 DIAGNOSIS — I10 Essential (primary) hypertension: Secondary | ICD-10-CM

## 2013-09-30 DIAGNOSIS — E1121 Type 2 diabetes mellitus with diabetic nephropathy: Secondary | ICD-10-CM

## 2013-09-30 DIAGNOSIS — E785 Hyperlipidemia, unspecified: Secondary | ICD-10-CM

## 2013-09-30 DIAGNOSIS — M159 Polyosteoarthritis, unspecified: Secondary | ICD-10-CM

## 2013-10-01 LAB — CBC WITH DIFFERENTIAL/PLATELET
Eos: 2 %
HCT: 34.5 % (ref 34.0–46.6)
Hemoglobin: 10.7 g/dL — ABNORMAL LOW (ref 11.1–15.9)
Immature Granulocytes: 0 %
Lymphocytes Absolute: 1.5 10*3/uL (ref 0.7–3.1)
MCHC: 31 g/dL — ABNORMAL LOW (ref 31.5–35.7)
MCV: 84 fL (ref 79–97)
Monocytes Absolute: 0.5 10*3/uL (ref 0.1–0.9)
Neutrophils Absolute: 5.3 10*3/uL (ref 1.4–7.0)
RDW: 15.5 % — ABNORMAL HIGH (ref 12.3–15.4)
WBC: 7.5 10*3/uL (ref 3.4–10.8)

## 2013-10-01 LAB — COMPREHENSIVE METABOLIC PANEL
ALT: 16 IU/L (ref 0–32)
AST: 23 IU/L (ref 0–40)
CO2: 22 mmol/L (ref 18–29)
Calcium: 9.6 mg/dL (ref 8.6–10.2)
Potassium: 4.5 mmol/L (ref 3.5–5.2)
Sodium: 141 mmol/L (ref 134–144)

## 2013-10-01 LAB — CK: Total CK: 92 U/L (ref 24–173)

## 2013-10-01 LAB — LIPID PANEL
HDL: 53 mg/dL (ref >39)
Triglycerides: 78 mg/dL (ref 0–149)

## 2013-10-02 ENCOUNTER — Ambulatory Visit: Payer: Medicare Other | Admitting: Internal Medicine

## 2013-10-08 ENCOUNTER — Encounter: Payer: Self-pay | Admitting: Internal Medicine

## 2013-10-08 ENCOUNTER — Ambulatory Visit (INDEPENDENT_AMBULATORY_CARE_PROVIDER_SITE_OTHER): Payer: Medicare Other | Admitting: Internal Medicine

## 2013-10-08 VITALS — BP 122/70 | HR 84 | Temp 98.1°F | Resp 16 | Wt 175.2 lb

## 2013-10-08 DIAGNOSIS — K5900 Constipation, unspecified: Secondary | ICD-10-CM

## 2013-10-08 DIAGNOSIS — N189 Chronic kidney disease, unspecified: Secondary | ICD-10-CM

## 2013-10-08 DIAGNOSIS — M1990 Unspecified osteoarthritis, unspecified site: Secondary | ICD-10-CM

## 2013-10-08 DIAGNOSIS — E785 Hyperlipidemia, unspecified: Secondary | ICD-10-CM

## 2013-10-08 DIAGNOSIS — Z23 Encounter for immunization: Secondary | ICD-10-CM

## 2013-10-08 DIAGNOSIS — I129 Hypertensive chronic kidney disease with stage 1 through stage 4 chronic kidney disease, or unspecified chronic kidney disease: Secondary | ICD-10-CM

## 2013-10-08 DIAGNOSIS — E1122 Type 2 diabetes mellitus with diabetic chronic kidney disease: Secondary | ICD-10-CM | POA: Insufficient documentation

## 2013-10-08 DIAGNOSIS — E1129 Type 2 diabetes mellitus with other diabetic kidney complication: Secondary | ICD-10-CM

## 2013-10-08 DIAGNOSIS — N058 Unspecified nephritic syndrome with other morphologic changes: Secondary | ICD-10-CM

## 2013-10-08 DIAGNOSIS — E1165 Type 2 diabetes mellitus with hyperglycemia: Secondary | ICD-10-CM

## 2013-10-08 MED ORDER — MELOXICAM 7.5 MG PO TABS: 7.5000 mg | ORAL_TABLET | Freq: Every day | ORAL | Status: DC

## 2013-10-08 MED ORDER — SENNOSIDES-DOCUSATE SODIUM 8.6-50 MG PO TABS: 1.0000 | ORAL_TABLET | Freq: Every day | ORAL | Status: DC

## 2013-10-08 MED ORDER — LINAGLIPTIN 5 MG PO TABS: 5.0000 mg | ORAL_TABLET | Freq: Every day | ORAL | Status: DC

## 2013-10-08 NOTE — Patient Instructions (Signed)
Start taking your tradjenta for your blood sugar  Record sugar readings twice a week and bring it on your next visit  Start taking senna-s 1 tab daily to help with your bowel movement. Stop senna.

## 2013-10-08 NOTE — Progress Notes (Signed)
Patient ID: Dawn [name], female   DOB: 08-17-1928, 79 y.o.   MRN: [id number]  Chief Complaint  Patient presents with  . Medical Managment of Chronic Issues    2 month f/u Arthritis, DM, HTN with labs printed  . Immunizations    needs Tdap<10 yrs  . other    achy more than usual & moving slower/stiff    Allergies  Allergen Reactions  . Keflex [Cephalexin]   . Metronidazole    HPI- Her sugar reading reviewed.Taking her glipizide. Blood sugar this am was 123. Yesterday was 95 in am and 208 in pm. Tolerating her medication well bp appears stable Bowel movement- miralax has been helpful. Senna has not been helping Complaints of her body aching, mainly her hip and knee. She sometimes feels her legs might be giving away She received her influenza vaccine today  Review of Systems  Constitutional: Negative for fever, chills, weight loss, malaise/fatigue and diaphoresis.  HENT: Negative for congestion and tinnitus.   Eyes: Negative for blurred vision.  Respiratory: Negative for cough and shortness of breath.   Cardiovascular: Negative for chest pain, palpitations and leg swelling.  Gastrointestinal: Negative for heartburn, nausea, vomiting and abdominal pain.  Genitourinary: Negative for dysuria, urgency and flank pain.  Musculoskeletal: Positive for joint pain. Negative for myalgias.  Skin: Negative for itching and rash.  Neurological: Negative for dizziness, focal weakness, weakness and headaches.  Psychiatric/Behavioral: Negative for depression and memory loss. The patient does not have insomnia.    Past Medical History  Diagnosis Date  . Unspecified constipation   . Chronic kidney disease, stage III (moderate)   . Disorder of bone and cartilage, unspecified   . Other hammer toe (acquired)   . Anorexia   . Postmenopausal atrophic vaginitis   . Anemia, unspecified   . Reflux esophagitis   . Unspecified disorder of kidney and ureter   . Osteoarthrosis, unspecified whether  generalized or localized, unspecified site   . Other and unspecified hyperlipidemia   . Cystocele, midline   . Unspecified essential hypertension   . Type II or unspecified type diabetes mellitus without mention of complication, not stated as uncontrolled   . Unspecified urinary incontinence   . Unspecified hereditary and idiopathic peripheral neuropathy   . Obesity, unspecified   . Kyphosis associated with other condition   . Chronic kidney disease    History reviewed. No pertinent past surgical history.  Current Outpatient Prescriptions on File Prior to Visit  Medication Sig Dispense Refill  . aspirin 81 MG tablet Take 81 mg by mouth daily.      . calcium-vitamin D (OSCAL WITH D) 500-200 MG-UNIT per tablet Take 1 tablet by mouth daily.      . cholecalciferol (VITAMIN D) 1000 UNITS tablet Take 1,000 Units by mouth daily.      Marland Kitchen desoximetasone (TOPICORT) 0.05 % cream Apply topically 2 (two) times daily.  30 g  3  . glipiZIDE (GLUCOTROL) 5 MG tablet TAKE 1 TABLET TWICE A DAY  180 tablet  3  . lisinopril-hydrochlorothiazide (PRINZIDE,ZESTORETIC) 20-12.5 MG per tablet Take 1 tablet by mouth daily.  90 tablet  3  . polyethylene glycol powder (GLYCOLAX/MIRALAX) powder Take 17 g by mouth daily as needed (constipation).  3350 g  1  . simvastatin (ZOCOR) 5 MG tablet Take 1 tablet (5 mg total) by mouth at bedtime.  90 tablet  3  . vitamin E 400 UNIT capsule Take 400 Units by mouth daily.  No current facility-administered medications on file prior to visit.   Physical exam- BP 122/70  Pulse 84  Temp(Src) 98.1 F (36.7 C) (Oral)  Resp 16  Wt 175 lb 3.2 oz (79.47 kg)  BMI 28.29 kg/m2  SpO2 98%  Constitutional: She is oriented to person, place, and time. She appears well-developed and well-nourished. No distress.   HENT:   Head: Normocephalic and atraumatic.  Neck: Normal range of motion. Neck supple. No thyromegaly present.   Cardiovascular: Normal rate, regular rhythm, normal heart  sounds and intact distal pulses.    Pulmonary/Chest: Effort normal and breath sounds normal.   Abdominal: Soft. Bowel sounds are normal. There is no tenderness.  Musculoskeletal: She exhibits no edema. Wrist joint and should joint ROM limited with pain Lymphadenopathy:    She has no cervical adenopathy.  Neurological: She is alert and oriented to person, place, and time. She has normal reflexes. No cranial nerve deficit.   Skin: Skin is warm and dry. No rash noted. She is not diaphoretic. No erythema.  Psychiatric: She has a normal mood and affect. Her behavior is normal.   Labs- CBC    Component Value Date/Time   WBC 7.5 09/30/2013 0938   RBC 4.13 09/30/2013 0938   HGB 10.7* 09/30/2013 0938   HCT 34.5 09/30/2013 0938   MCV 84 09/30/2013 0938   MCH 25.9* 09/30/2013 0938   MCHC 31.0* 09/30/2013 0938   RDW 15.5* 09/30/2013 0938   LYMPHSABS 1.5 09/30/2013 0938   EOSABS 0.2 09/30/2013 0938   BASOSABS 0.0 09/30/2013 0938    CMP     Component Value Date/Time   NA 141 09/30/2013 0938   K 4.5 09/30/2013 0938   CL 97 09/30/2013 0938   CO2 22 09/30/2013 0938   GLUCOSE 158* 09/30/2013 0938   BUN 27 09/30/2013 0938   CREATININE 1.50* 09/30/2013 0938   CALCIUM 9.6 09/30/2013 0938   PROT 6.4 09/30/2013 0938   AST 23 09/30/2013 0938   ALT 16 09/30/2013 0938   ALKPHOS 96 09/30/2013 0938   BILITOT 0.5 09/30/2013 0938   GFRNONAA 32* 09/30/2013 0938   GFRAA 37* 09/30/2013 0938    Assessment/plan  DM type 2 with renal complication- Continue cbg monitor. Goal a1c < 7 for her with her age. Continue asa, simvastatin and ACEI. Restart tradjenta 5 mg daily with glipizide. Normal foot exam. Check urine microalbumin. Recheck a1c prior to next visit  Constipation- Encouraged fiber intake and water intake. Will d/c senna and have her on sennas with rn miralax for now. Reassess if no improvement  Generalized OA-  Will start her on meloxicam 7.5 mg po daily for now and reassess. Weight bearing  exercise encouraged  HTN- bp well controlled. Continue lisinopril-hctz 20-12.5 for now.   Hyperlipidemia- ldl at goal. continue zocor for now. Normal ck and lft

## 2013-10-10 ENCOUNTER — Encounter: Payer: Self-pay | Admitting: *Deleted

## 2013-10-22 ENCOUNTER — Other Ambulatory Visit: Payer: Self-pay | Admitting: *Deleted

## 2013-10-22 MED ORDER — MELOXICAM 7.5 MG PO TABS: 7.5000 mg | ORAL_TABLET | Freq: Every day | ORAL | Status: DC

## 2013-12-25 ENCOUNTER — Ambulatory Visit (INDEPENDENT_AMBULATORY_CARE_PROVIDER_SITE_OTHER): Payer: Medicare Other | Admitting: Podiatrist

## 2013-12-25 ENCOUNTER — Encounter: Payer: Self-pay | Admitting: Podiatrist

## 2013-12-25 VITALS — BP 125/66 | HR 83 | Resp 16

## 2013-12-25 DIAGNOSIS — B351 Tinea unguium: Secondary | ICD-10-CM

## 2013-12-25 DIAGNOSIS — M79609 Pain in unspecified limb: Secondary | ICD-10-CM

## 2013-12-25 NOTE — Patient Instructions (Signed)
Diabetes and Foot Care Diabetes may cause you to have problems because of poor blood supply (circulation) to your feet and legs. This may cause the skin on your feet to become thinner, break easier, and heal more slowly. Your skin may become dry, and the skin may peel and crack. You may also have nerve damage in your legs and feet causing decreased feeling in them. You may not notice minor injuries to your feet that could lead to infections or more serious problems. Taking care of your feet is one of the most important things you can do for yourself.  HOME CARE INSTRUCTIONS  Wear shoes at all times, even in the house. Do not go barefoot. Bare feet are easily injured.  Check your feet daily for blisters, cuts, and redness. If you cannot see the bottom of your feet, use a mirror or ask someone for help.  Wash your feet with warm water (do not use hot water) and mild soap. Then pat your feet and the areas between your toes until they are completely dry. Do not soak your feet as this can dry your skin.  Apply a moisturizing lotion or petroleum jelly (that does not contain alcohol and is unscented) to the skin on your feet and to dry, brittle toenails. Do not apply lotion between your toes.  Trim your toenails straight across. Do not dig under them or around the cuticle. File the edges of your nails with an emery board or nail file.  Do not cut corns or calluses or try to remove them with medicine.  Wear clean socks or stockings every day. Make sure they are not too tight. Do not wear knee-high stockings since they may decrease blood flow to your legs.  Wear shoes that fit properly and have enough cushioning. To break in new shoes, wear them for just a few hours a day. This prevents you from injuring your feet. Always look in your shoes before you put them on to be sure there are no objects inside.  Do not cross your legs. This may decrease the blood flow to your feet.  If you find a minor scrape,  cut, or break in the skin on your feet, keep it and the skin around it clean and dry. These areas may be cleansed with mild soap and water. Do not cleanse the area with peroxide, alcohol, or iodine.  When you remove an adhesive bandage, be sure not to damage the skin around it.  If you have a wound, look at it several times a day to make sure it is healing.  Do not use heating pads or hot water bottles. They may burn your skin. If you have lost feeling in your feet or legs, you may not know it is happening until it is too late.  Make sure your health care provider performs a complete foot exam at least annually or more often if you have foot problems. Report any cuts, sores, or bruises to your health care provider immediately. SEEK MEDICAL CARE IF:   You have an injury that is not healing.  You have cuts or breaks in the skin.  You have an ingrown nail.  You notice redness on your legs or feet.  You feel burning or tingling in your legs or feet.  You have pain or cramps in your legs and feet.  Your legs or feet are numb.  Your feet always feel cold. SEEK IMMEDIATE MEDICAL CARE IF:   There is increasing redness,   swelling, or pain in or around a wound.  There is a red line that goes up your leg.  Pus is coming from a wound.  You develop a fever or as directed by your health care provider.  You notice a bad smell coming from an ulcer or wound. Document Released: 11/25/2000 Document Revised: 07/31/2013 Document Reviewed: 05/07/2013 [employer] Patient Information 2014 [employer].  

## 2013-12-25 NOTE — Progress Notes (Signed)
HPI:  Patient presents today for follow up of foot and nail care. States the right great toe continues to be painful despite repeated efforts to trim out the painful medial side.  Objective:  Patients chart is reviewed.  Neurovascular status unchanged.  Pulses palpable bilateral 2/4 dp/ pt. Patients nails are thickened, discolored, distrophic, friable and brittle with yellow-brown discoloration. Patient subjectively relates they are painful with shoes and with ambulation of bilateral feet.  Assessment:  Symptomatic onychomycosis, ingrown nail right first medial side   Plan:  Discussed treatment options and alternatives.  The symptomatic toenails were debrided through manual an mechanical means without complication. We'll set her up an appointment in one month for a permanent phenol matrixectomy right hallux medial nail border.  Trudie Buckler, DPM

## 2014-01-01 ENCOUNTER — Other Ambulatory Visit: Payer: Self-pay | Admitting: Internal Medicine

## 2014-01-03 ENCOUNTER — Other Ambulatory Visit: Payer: Medicare Other

## 2014-01-03 DIAGNOSIS — E1129 Type 2 diabetes mellitus with other diabetic kidney complication: Secondary | ICD-10-CM

## 2014-01-03 DIAGNOSIS — E1165 Type 2 diabetes mellitus with hyperglycemia: Principal | ICD-10-CM

## 2014-01-03 DIAGNOSIS — IMO0002 Reserved for concepts with insufficient information to code with codable children: Secondary | ICD-10-CM

## 2014-01-04 LAB — BASIC METABOLIC PANEL
BUN/Creatinine Ratio: 14 (ref 11–26)
BUN: 20 mg/dL (ref 8–27)
CALCIUM: 9.2 mg/dL (ref 8.7–10.3)
CHLORIDE: 100 mmol/L (ref 97–108)
CO2: 22 mmol/L (ref 18–29)
CREATININE: 1.38 mg/dL — AB (ref 0.57–1.00)
GFR calc Af Amer: 41 mL/min/{1.73_m2} — ABNORMAL LOW (ref >59)
GFR calc non Af Amer: 35 mL/min/{1.73_m2} — ABNORMAL LOW (ref >59)
GLUCOSE: 120 mg/dL — AB (ref 65–99)
Potassium: 4.6 mmol/L (ref 3.5–5.2)
Sodium: 140 mmol/L (ref 134–144)

## 2014-01-04 LAB — HEMOGLOBIN A1C
ESTIMATED AVERAGE GLUCOSE: 148 mg/dL
HEMOGLOBIN A1C: 6.8 % — AB (ref 4.8–5.6)

## 2014-01-06 ENCOUNTER — Other Ambulatory Visit: Payer: Medicare Other

## 2014-01-06 NOTE — Progress Notes (Signed)
appt 01/08/14 @ 2:00

## 2014-01-08 ENCOUNTER — Encounter: Payer: Self-pay | Admitting: Internal Medicine

## 2014-01-08 ENCOUNTER — Ambulatory Visit (INDEPENDENT_AMBULATORY_CARE_PROVIDER_SITE_OTHER): Payer: Medicare Other | Admitting: Internal Medicine

## 2014-01-08 VITALS — BP 124/72 | HR 47 | Temp 97.6°F | Resp 10 | Ht 65.0 in | Wt 177.0 lb

## 2014-01-08 DIAGNOSIS — M159 Polyosteoarthritis, unspecified: Secondary | ICD-10-CM

## 2014-01-08 DIAGNOSIS — K5900 Constipation, unspecified: Secondary | ICD-10-CM

## 2014-01-08 DIAGNOSIS — I10 Essential (primary) hypertension: Secondary | ICD-10-CM

## 2014-01-08 DIAGNOSIS — M79606 Pain in leg, unspecified: Secondary | ICD-10-CM | POA: Insufficient documentation

## 2014-01-08 DIAGNOSIS — E1165 Type 2 diabetes mellitus with hyperglycemia: Principal | ICD-10-CM

## 2014-01-08 DIAGNOSIS — E785 Hyperlipidemia, unspecified: Secondary | ICD-10-CM

## 2014-01-08 DIAGNOSIS — E1129 Type 2 diabetes mellitus with other diabetic kidney complication: Secondary | ICD-10-CM

## 2014-01-08 DIAGNOSIS — Z0000 Encounter for general adult medical examination without abnormal findings: Secondary | ICD-10-CM

## 2014-01-08 DIAGNOSIS — M79609 Pain in unspecified limb: Secondary | ICD-10-CM

## 2014-01-08 DIAGNOSIS — IMO0002 Reserved for concepts with insufficient information to code with codable children: Secondary | ICD-10-CM

## 2014-01-08 MED ORDER — SIMVASTATIN 5 MG PO TABS: 5.0000 mg | ORAL_TABLET | Freq: Every day | ORAL | Status: DC

## 2014-01-08 MED ORDER — GABAPENTIN 100 MG PO CAPS: ORAL_CAPSULE | ORAL | Status: DC

## 2014-01-08 MED ORDER — TETANUS-DIPHTH-ACELL PERTUSSIS 5-2.5-18.5 LF-MCG/0.5 IM SUSP: 0.5000 mL | Freq: Once | INTRAMUSCULAR | Status: DC

## 2014-01-08 MED ORDER — LINACLOTIDE 145 MCG PO CAPS: 145.0000 ug | ORAL_CAPSULE | Freq: Every day | ORAL | Status: DC

## 2014-01-08 NOTE — Progress Notes (Signed)
Patient ID: Dawn [name], female   DOB: 09-01-1928, 80 y.o.   MRN: [id number]    Chief Complaint  Patient presents with  . Annual Exam    Yearly check-up and discuss labs   . Leg Problem    Ongoing leg pain, patient unable to stands for long periods of time . Mobic not working   . Hand Problem    Off/on hand cramping, more frequently with in the last couple months    Allergies  Allergen Reactions  . Keflex [Cephalexin]   . Metronidazole     HPI 80 y/o female pt is here for her annual visit. She complaints of pain in both her legs after standing for some time. She also has cramps. She has pain in her hips.  Reviewed her cbg from home 77-144 bp appears stable She is constipated. Senna has not been helpful.   Review of Systems  Constitutional: Negative for fever, chills, weight loss, malaise/fatigue and diaphoresis.  HENT: Negative for congestion and tinnitus.   Eyes: Negative for blurred vision.  Respiratory: Negative for cough and shortness of breath.   Cardiovascular: Negative for chest pain, palpitations and leg swelling.  Gastrointestinal: Negative for heartburn, nausea, vomiting and abdominal pain.  Genitourinary: Negative for dysuria, urgency and flank pain. Has nocturia Musculoskeletal: Positive for joint pain. Negative for myalgias.  Skin: Negative for itching and rash.  Neurological: Negative for dizziness, focal weakness, weakness and headaches.  Psychiatric/Behavioral: Negative for depression and memory loss. The patient does not have insomnia.    Past Medical History  Diagnosis Date  . Unspecified constipation   . Chronic kidney disease, stage III (moderate)   . Disorder of bone and cartilage, unspecified   . Other hammer toe (acquired)   . Anorexia   . Postmenopausal atrophic vaginitis   . Anemia, unspecified   . Reflux esophagitis   . Unspecified disorder of kidney and ureter   . Osteoarthrosis, unspecified whether generalized or localized, unspecified site     . Other and unspecified hyperlipidemia   . Cystocele, midline   . Unspecified essential hypertension   . Type II or unspecified type diabetes mellitus without mention of complication, not stated as uncontrolled   . Unspecified urinary incontinence   . Unspecified hereditary and idiopathic peripheral neuropathy   . Obesity, unspecified   . Kyphosis associated with other condition   . Chronic kidney disease    History reviewed. No pertinent past surgical history. Current Outpatient Prescriptions on File Prior to Visit  Medication Sig Dispense Refill  . aspirin 81 MG tablet Take 81 mg by mouth daily.      . calcium-vitamin D (OSCAL WITH D) 500-200 MG-UNIT per tablet Take 1 tablet by mouth daily.      . cholecalciferol (VITAMIN D) 1000 UNITS tablet Take 1,000 Units by mouth daily.      Marland Kitchen desoximetasone (TOPICORT) 0.05 % cream Apply topically 2 (two) times daily.  30 g  3  . glipiZIDE (GLUCOTROL) 5 MG tablet TAKE 1 TABLET TWICE A DAY  180 tablet  3  . linagliptin (TRADJENTA) 5 MG TABS tablet Take 1 tablet (5 mg total) by mouth daily.  90 tablet  3  . lisinopril-hydrochlorothiazide (PRINZIDE,ZESTORETIC) 20-12.5 MG per tablet Take 1 tablet by mouth daily.  90 tablet  3  . meloxicam (MOBIC) 7.5 MG tablet TAKE 1 TABLET DAILY  90 tablet  0  . polyethylene glycol powder (GLYCOLAX/MIRALAX) powder Take 17 g by mouth daily as needed (constipation).  3350 g  1  . senna-docusate (SENOKOT-S) 8.6-50 MG per tablet Take 1 tablet by mouth daily.  30 tablet  3  . simvastatin (ZOCOR) 5 MG tablet Take 1 tablet (5 mg total) by mouth at bedtime.  90 tablet  3  . vitamin E 400 UNIT capsule Take 400 Units by mouth daily.       No current facility-administered medications on file prior to visit.    Physical exam BP 124/72  Pulse 47  Temp(Src) 97.6 F (36.4 C) (Oral)  Resp 10  Ht 5\' 5"  (1.651 m)  Wt 177 lb (80.287 kg)  BMI 29.45 kg/m2  SpO2 99%  General- elderly female in no acute distress Head-  atraumatic, normocephalic Eyes- PERRLA, EOMI, no pallor, no icterus, no discharge Neck- no lymphadenopathy, no thyromegaly, no jugular vein distension, no carotid bruit Ears- left ear normal tympanic membrane and normal external ear canal , right ear normal tympanic membrane and normal external ear canal Chest- no chest wall deformities, no chest wall tenderness Breast- no masses, no palpable lumps, normal nipple and areola exam, no axillary lymphadenopathy Cardiovascular- normal s1,s2, no murmurs/ rubs/ gallops Respiratory- bilateral clear to auscultation, no wheeze, no rhonchi, no crackles Abdomen- bowel sounds present, soft, non tender, no organomegaly, no abdominal bruits, no guarding or rigidity, no CVA tenderness, guaiac stool negative Musculoskeletal- able to move all 4 extremities, no spinal and paraspinal tenderness, steady gait, no use of assistive device Neurological- no focal deficit, normal reflexes, normal muscle strength, normal sensation to fine touch and vibration Psychiatry- alert and oriented to person, place and time, normal mood and affect  Labs- CBC    Component Value Date/Time   WBC 7.5 09/30/2013 0938   RBC 4.13 09/30/2013 0938   HGB 10.7* 09/30/2013 0938   HCT 34.5 09/30/2013 0938   MCV 84 09/30/2013 0938   MCH 25.9* 09/30/2013 0938   MCHC 31.0* 09/30/2013 0938   RDW 15.5* 09/30/2013 0938   LYMPHSABS 1.5 09/30/2013 0938   EOSABS 0.2 09/30/2013 0938   BASOSABS 0.0 09/30/2013 0938    CMP     Component Value Date/Time   NA 140 01/03/2014 0943   K 4.6 01/03/2014 0943   CL 100 01/03/2014 0943   CO2 22 01/03/2014 0943   GLUCOSE 120* 01/03/2014 0943   BUN 20 01/03/2014 0943   CREATININE 1.38* 01/03/2014 0943   CALCIUM 9.2 01/03/2014 0943   PROT 6.4 09/30/2013 0938   AST 23 09/30/2013 0938   ALT 16 09/30/2013 0938   ALKPHOS 96 09/30/2013 0938   BILITOT 0.5 09/30/2013 0938   GFRNONAA 35* 01/03/2014 0943   GFRAA 41* 01/03/2014 0943   Lab Results  Component Value  Date   HGBA1C 6.8* 01/03/2014   Lipid Panel     Component Value Date/Time   TRIG 78 09/30/2013 0938   HDL 53 09/30/2013 0938   CHOLHDL 2.8 09/30/2013 0938   LDLCALC 80 09/30/2013 0938    Assessment/plan  1. Unspecified essential hypertension Controlled. Continue lisinopril-hctz once a day  2. DM type 2, uncontrolled, with renal complications Improved [id number]. cotninue glipizide and tradjenta. Monitor cbg. Normal urine microalbumin. Continue statin, aspirin and ACEI  3. Other and unspecified hyperlipidemia Continue zocor 5 mg daily.  4. Unspecified constipation miralax and senna-s not working. Will try linzess 145 mcg daily for now, samples and script provided  5. Leg pain Possible neuropathic pain given her htn and dm. Will have her on gabapentin 100 mg bid to start with and can uptitrate to 100  mg tid if no improvement. Explained common side effect  6. Routine general medical examination at a health care facility Normal exam. Script for tdap provided. uptodate with other immunization. guaiac negative. Exercise and dietary counselling present  7. Generalized OA Continue prn tylenol with ca-vit d

## 2014-01-08 NOTE — Patient Instructions (Signed)
Stop these medications  1)mobic / meloxicam  2) senna-s  3) miralax

## 2014-01-10 ENCOUNTER — Telehealth: Payer: Self-pay | Admitting: *Deleted

## 2014-01-10 NOTE — Telephone Encounter (Signed)
Patient called and stated that the constipation medication Linzess has caused diarrhea. She stated that she took the first dose and it went right through her and caused diarrhea. She said she would not be taking anymore of this.

## 2014-01-10 NOTE — Telephone Encounter (Signed)
The medication can cause some loose stool for first few days of taking it and then gets improved. Is she taking it prior to/ with a full meal as this will help. i do not want her to stop it without trying for a week.

## 2014-01-13 NOTE — Telephone Encounter (Signed)
Pt notified VIA phone to try taking medication for at least a week and it will get better, if not call us back.   Pt Aware/agreeable.

## 2014-01-23 ENCOUNTER — Encounter: Payer: Self-pay | Admitting: Podiatrist

## 2014-01-23 ENCOUNTER — Ambulatory Visit (INDEPENDENT_AMBULATORY_CARE_PROVIDER_SITE_OTHER): Payer: Medicare Other | Admitting: Podiatrist

## 2014-01-23 VITALS — BP 126/86 | HR 86 | Resp 12

## 2014-01-23 DIAGNOSIS — E1149 Type 2 diabetes mellitus with other diabetic neurological complication: Secondary | ICD-10-CM

## 2014-01-23 DIAGNOSIS — N058 Unspecified nephritic syndrome with other morphologic changes: Secondary | ICD-10-CM

## 2014-01-23 DIAGNOSIS — E1121 Type 2 diabetes mellitus with diabetic nephropathy: Secondary | ICD-10-CM

## 2014-01-23 DIAGNOSIS — E1142 Type 2 diabetes mellitus with diabetic polyneuropathy: Secondary | ICD-10-CM

## 2014-01-23 DIAGNOSIS — B351 Tinea unguium: Secondary | ICD-10-CM

## 2014-01-23 DIAGNOSIS — E1140 Type 2 diabetes mellitus with diabetic neuropathy, unspecified: Secondary | ICD-10-CM

## 2014-01-23 DIAGNOSIS — E1129 Type 2 diabetes mellitus with other diabetic kidney complication: Secondary | ICD-10-CM

## 2014-01-23 DIAGNOSIS — M79609 Pain in unspecified limb: Secondary | ICD-10-CM

## 2014-01-23 NOTE — Progress Notes (Signed)
HPI: Patient presents today for follow up of diabetic foot and nail care. Past medical history, meds, and allergies reviewed. Patient states blood sugar is under good  control.  Patient states the pain she has been experiencing on the right great toenail has gone away.  She now has pain on the left 2nd and 3rd toe.   Objective:  Neurovascular status unchanged with palpable pedal pulses at 1 out of 4 dp and pt   neurological exam reveals protective sensation to be  intact.      absent  pre-ulcerative lesions.  Toenails are elongated, incurvated, discolored, dystrophic with ingrown deformity present.  Patient relates pain at Left 2nd and 3rd toe due to rub on shoes.  No redness or swelling, no sign of infection present.   Assessment: Diabetes , Ingrown nail deformity,   Plan: Discussed treatment options and alternatives. Debrided nails without complication. Patient requests diabetic shoes at this visit.  Diabetes with contracture of digit.  Will request diabetic shoes from her primary physician.  Return appointment recommended at routine intervals of 3 months.   Trudie Buckler, DPM

## 2014-02-05 ENCOUNTER — Ambulatory Visit (INDEPENDENT_AMBULATORY_CARE_PROVIDER_SITE_OTHER): Payer: Medicare Other | Admitting: Internal Medicine

## 2014-02-05 ENCOUNTER — Encounter: Payer: Self-pay | Admitting: Internal Medicine

## 2014-02-05 VITALS — BP 136/82 | HR 90 | Resp 10 | Wt 179.2 lb

## 2014-02-05 DIAGNOSIS — M79609 Pain in unspecified limb: Secondary | ICD-10-CM

## 2014-02-05 DIAGNOSIS — E1149 Type 2 diabetes mellitus with other diabetic neurological complication: Secondary | ICD-10-CM

## 2014-02-05 DIAGNOSIS — M79605 Pain in left leg: Principal | ICD-10-CM

## 2014-02-05 DIAGNOSIS — M79604 Pain in right leg: Secondary | ICD-10-CM

## 2014-02-05 DIAGNOSIS — E1142 Type 2 diabetes mellitus with diabetic polyneuropathy: Secondary | ICD-10-CM | POA: Insufficient documentation

## 2014-02-05 DIAGNOSIS — K5900 Constipation, unspecified: Secondary | ICD-10-CM

## 2014-02-05 DIAGNOSIS — E119 Type 2 diabetes mellitus without complications: Secondary | ICD-10-CM

## 2014-02-05 DIAGNOSIS — I10 Essential (primary) hypertension: Secondary | ICD-10-CM

## 2014-02-05 MED ORDER — TRAMADOL HCL 50 MG PO TABS: 50.0000 mg | ORAL_TABLET | Freq: Two times a day (BID) | ORAL | Status: DC | PRN

## 2014-02-05 MED ORDER — GABAPENTIN 300 MG PO CAPS: ORAL_CAPSULE | ORAL | Status: DC

## 2014-02-05 NOTE — Progress Notes (Signed)
Patient ID: Dawn [name], female   DOB: Dec 09, 1928, 81 y.o.   MRN: [id number]    Chief Complaint  Patient presents with  . Follow-up    leg pain, dm, HTN   Allergies  Allergen Reactions  . Keflex [Cephalexin]   . Metronidazole    HPI 80 y/o female pt is here for her routine visit. She complaints of pain in both her legs after standing and or walking for some time and it is mainly in thigh and buttock area. cbg this am was 124. Senna-s is helping with her constipation. Could not tolerate linzess, thus stopped. bp appears stable  Review of Systems   Constitutional: Negative for fever, chills, weight loss, malaise/fatigue and diaphoresis.   HENT: Negative for congestion and tinnitus.    Eyes: Negative for blurred vision.   Respiratory: Negative for cough and shortness of breath.    Cardiovascular: Negative for chest pain, palpitations and leg swelling.   Gastrointestinal: Negative for heartburn, nausea, vomiting and abdominal pain.   Genitourinary: Negative for dysuria, urgency and flank pain. Has nocturia Musculoskeletal: Positive for joint pain. Negative for myalgias.   Skin: Negative for itching and rash.   Neurological: Negative for dizziness, focal weakness, weakness and headaches.   Psychiatric/Behavioral: Negative for depression and memory loss. The patient does not have insomnia.     Physical exam BP 136/82  Pulse 90  Resp 10  Wt 179 lb 3.2 oz (81.285 kg)  SpO2 93%  General- elderly female in no acute distress Head- atraumatic, normocephalic Cardiovascular- normal s1,s2, no murmurs/ rubs/ gallops Respiratory- bilateral clear to auscultation, no wheeze, no rhonchi, no crackles Abdomen- bowel sounds present, soft, non tender, no organomegaly Musculoskeletal- able to move all 4 extremities, no spinal and paraspinal tenderness, steady gait Neurological- no focal deficit, normal reflexes, normal muscle strength, normal sensation to fine touch and vibration Psychiatry- alert  and oriented to person, place and time, normal mood and affect  Labs-  Lab Results  Component Value Date   HGBA1C 6.8* 01/03/2014   Assessment/plan  1. Bilateral leg pain With hx of dm, neuropathic pain could be contributing some. Will change gabapentin to 300 mg daily at bedtime. Add tramadol 50 mg q12h prn for now. Order lumbar spine xray. Circulation is good Good sensory exam Vibration intact  - DG Lumbar Spine Complete; Future  2. Unspecified essential hypertension Continue lisinopril-hctz once a day  3. Unspecified constipation Senna-s has been working for now  4. Type II or unspecified type diabetes mellitus without mention of complication, not stated as uncontrolled cotninue glipizide and tradjenta. Monitor cbg. Normal urine microalbumin. Continue statin, aspirin and ACEI  5. DM type 2 with diabetic peripheral neuropathy cotninue glipizide and tradjenta. Monitor cbg. Normal urine microalbumin. Continue statin, aspirin and ACEI. Continue gabapentin- adjusted dosing

## 2014-02-06 ENCOUNTER — Telehealth: Payer: Self-pay | Admitting: *Deleted

## 2014-02-06 NOTE — Telephone Encounter (Signed)
Pt request status of diabetic shoes.

## 2014-02-07 ENCOUNTER — Other Ambulatory Visit: Payer: Self-pay | Admitting: *Deleted

## 2014-02-07 MED ORDER — GABAPENTIN 300 MG PO CAPS: ORAL_CAPSULE | ORAL | Status: DC

## 2014-02-07 NOTE — Telephone Encounter (Signed)
I do not have paperwork started for this year. Last info recorded in [place] is from 2012?!

## 2014-02-12 ENCOUNTER — Ambulatory Visit
Admission: RE | Admit: 2014-02-12 | Discharge: 2014-02-12 | Disposition: A | Discharge status: Home / Self Care | Payer: Medicare Other | Source: Ambulatory Visit | Attending: Internal Medicine | Admitting: Internal Medicine

## 2014-02-12 DIAGNOSIS — M79604 Pain in right leg: Secondary | ICD-10-CM

## 2014-02-12 DIAGNOSIS — M79605 Pain in left leg: Principal | ICD-10-CM

## 2014-03-05 ENCOUNTER — Ambulatory Visit (INDEPENDENT_AMBULATORY_CARE_PROVIDER_SITE_OTHER): Payer: Medicare Other | Admitting: Internal Medicine

## 2014-03-05 ENCOUNTER — Encounter: Payer: Self-pay | Admitting: Internal Medicine

## 2014-03-05 VITALS — BP 124/80 | HR 72 | Wt 181.0 lb

## 2014-03-05 DIAGNOSIS — M79609 Pain in unspecified limb: Secondary | ICD-10-CM

## 2014-03-05 DIAGNOSIS — IMO0002 Reserved for concepts with insufficient information to code with codable children: Secondary | ICD-10-CM

## 2014-03-05 DIAGNOSIS — M79605 Pain in left leg: Principal | ICD-10-CM

## 2014-03-05 DIAGNOSIS — M792 Neuralgia and neuritis, unspecified: Secondary | ICD-10-CM | POA: Insufficient documentation

## 2014-03-05 DIAGNOSIS — M79604 Pain in right leg: Secondary | ICD-10-CM

## 2014-03-05 MED ORDER — GABAPENTIN 300 MG PO CAPS: 300.0000 mg | ORAL_CAPSULE | Freq: Two times a day (BID) | ORAL | Status: DC

## 2014-03-05 MED ORDER — TRAMADOL HCL 50 MG PO TABS: 50.0000 mg | ORAL_TABLET | Freq: Three times a day (TID) | ORAL | Status: DC | PRN

## 2014-03-05 NOTE — Progress Notes (Signed)
Patient ID: Dawn [name], female   DOB: 09-04-1928, 80 y.o.   MRN: [id number]    Chief Complaint  Patient presents with  . Follow-up    leg pain    Allergies  Allergen Reactions  . Keflex [Cephalexin]   . Metronidazole    HPI 80 y/o female patient is here for follow up of her leg pain. Her gabapentin was increased to 100 mg in am and 300 mg in pm in last visit. This has helped some with her pain but it persists. She is taking tramadol once a day. Standing for long time worsens the pain. No claudication. No muscle cramps. Low back discomfort with left leg pain > right leg  Review of Systems   Constitutional: Negative for fever, chills, weight loss, malaise/fatigue and diaphoresis.   HENT: Negative for congestion and tinnitus.    Respiratory: Negative for cough and shortness of breath.    Cardiovascular: Negative for chest pain, palpitations and leg swelling.   Genitourinary: Negative for dysuria, urgency and flank pain. Has nocturia Musculoskeletal: Positive for joint pain. Negative for myalgias.   Skin: Negative for itching and rash.   Neurological: Negative for dizziness, focal weakness, weakness and headaches.   Psychiatric/Behavioral: Negative for depression and memory loss. The patient does not have insomnia.  Past Medical History  Diagnosis Date  . Unspecified constipation   . Chronic kidney disease, stage III (moderate)   . Disorder of bone and cartilage, unspecified   . Other hammer toe (acquired)   . Anorexia   . Postmenopausal atrophic vaginitis   . Anemia, unspecified   . Reflux esophagitis   . Unspecified disorder of kidney and ureter   . Osteoarthrosis, unspecified whether generalized or localized, unspecified site   . Other and unspecified hyperlipidemia   . Cystocele, midline   . Unspecified essential hypertension   . Type II or unspecified type diabetes mellitus without mention of complication, not stated as uncontrolled   . Unspecified urinary incontinence     . Unspecified hereditary and idiopathic peripheral neuropathy   . Obesity, unspecified   . Kyphosis associated with other condition   . Chronic kidney disease    History reviewed. No pertinent past surgical history.  Current Outpatient Prescriptions on File Prior to Visit  Medication Sig Dispense Refill  . aspirin 81 MG tablet Take 81 mg by mouth daily.      . calcium-vitamin D (OSCAL WITH D) 500-200 MG-UNIT per tablet Take 1 tablet by mouth daily.      . cholecalciferol (VITAMIN D) 1000 UNITS tablet Take 1,000 Units by mouth daily.      Marland Kitchen desoximetasone (TOPICORT) 0.05 % cream Apply topically 2 (two) times daily.  30 g  3  . glipiZIDE (GLUCOTROL) 5 MG tablet TAKE 1 TABLET TWICE A DAY  180 tablet  3  . linagliptin (TRADJENTA) 5 MG TABS tablet Take 1 tablet (5 mg total) by mouth daily.  90 tablet  3  . lisinopril-hydrochlorothiazide (PRINZIDE,ZESTORETIC) 20-12.5 MG per tablet Take 1 tablet by mouth daily.  90 tablet  3  . sennosides-docusate sodium (SENOKOT-S) 8.6-50 MG tablet Take 1 tablet by mouth daily.      . simvastatin (ZOCOR) 5 MG tablet Take 1 tablet (5 mg total) by mouth at bedtime.  90 tablet  3  . vitamin E 400 UNIT capsule Take 400 Units by mouth daily.      . Tdap (BOOSTRIX) 5-2.5-18.5 LF-MCG/0.5 injection Inject 0.5 mLs into the muscle once.  0.5 mL  0   No current facility-administered medications on file prior to visit.    Physical exam BP 124/80  Pulse 72  Wt 181 lb (82.101 kg)  Constitutional: She is oriented to person, place, and time. She appears well-developed and well-nourished. No distress.   HENT:   Head: Normocephalic and atraumatic.  Neck: Normal range of motion. Neck supple. No thyromegaly present.   Cardiovascular: Normal rate, regular rhythm, normal heart sounds and intact distal pulses.    Pulmonary/Chest: Effort normal and breath sounds normal.   Abdominal: Soft. Bowel sounds are normal. There is no tenderness.  Musculoskeletal: She exhibits no  edema. No calf tenderness Lymphadenopathy:    She has no cervical adenopathy.  Neurological: She is alert and oriented to person, place, and time. She has normal reflexes. No cranial nerve deficit.   Skin: Skin is warm and dry. No rash noted. She is not diaphoretic. No erythema.  Psychiatric: She has a normal mood and affect. Her behavior is normal.   Imaging  02/05/14 EXAM: LUMBAR SPINE - COMPLETE 4+ VIEW   COMPARISON:  None.   FINDINGS: The lumbar vertebrae are in normal alignment. Degenerative disc disease is present particularly at L4-5 at L5-S1 where there is loss of disc space sclerosis, spurring, and vacuum disc phenomena present. No compression deformity is seen. Degenerative change involves the facet joints particularly of L4-5 and L5-S1. The SI joints appear corticated.   IMPRESSION: Degenerative disc disease at L4-5 and L5-S1.  No acute abnormality.    Assessment/plan 1. Bilateral leg pain Likely from progressive osteoarthritis with spurring. Will increase tramadol to 50 mg tid for now. Reassess if no improvement to consider MRI to assess for spinal stenosis, disc bulging and for treatment option  2. Neuropathic pain Persists, will increase gabapentin to 300 mg bid for now

## 2014-03-06 ENCOUNTER — Ambulatory Visit: Payer: Medicare Other | Admitting: Internal Medicine

## 2014-03-07 ENCOUNTER — Telehealth: Payer: Self-pay

## 2014-03-07 MED ORDER — GABAPENTIN 600 MG PO TABS: ORAL_TABLET | ORAL | Status: DC

## 2014-03-07 NOTE — Telephone Encounter (Signed)
RX sent electronically to mail order pharmacy for 600 mg tablet

## 2014-03-07 NOTE — Telephone Encounter (Signed)
Message received via fax from [facility] indicating that Gabapentin 300 mg is unavailable. Alternatives include Gabapentin 100 mg, 400 mg or 600 mg (scored tablet).  Please advise on alternative, patient currently taking 300 mg BID

## 2014-03-07 NOTE — Telephone Encounter (Signed)
Yes 600 1/2 tab bid is fine

## 2014-03-07 NOTE — Telephone Encounter (Signed)
This will be a total of 540 pills for a 3 month supply. Would it be ok to have patient take 600 mg tablet 1/2 am, 1/2 pm?

## 2014-03-07 NOTE — Telephone Encounter (Signed)
It will then be 100 mg three tablets in the morning and three in the evening

## 2014-03-12 ENCOUNTER — Telehealth: Payer: Self-pay | Admitting: *Deleted

## 2014-03-12 NOTE — Telephone Encounter (Signed)
Patient just wanted you to know that she hasn't had to take the full dose of her Tramadol and that since she hasn't had to the "episodes" went away. She has been taking the Gabapentin.

## 2014-03-28 ENCOUNTER — Ambulatory Visit (INDEPENDENT_AMBULATORY_CARE_PROVIDER_SITE_OTHER): Payer: Medicare Other | Admitting: *Deleted

## 2014-03-28 DIAGNOSIS — E1129 Type 2 diabetes mellitus with other diabetic kidney complication: Secondary | ICD-10-CM

## 2014-03-28 DIAGNOSIS — E1121 Type 2 diabetes mellitus with diabetic nephropathy: Secondary | ICD-10-CM

## 2014-03-28 DIAGNOSIS — N058 Unspecified nephritic syndrome with other morphologic changes: Secondary | ICD-10-CM

## 2014-03-28 NOTE — Progress Notes (Signed)
   Subjective:    Patient ID: Dawn [name], female    DOB: [birth year], 80 y.o.   MRN: [id number]  HPI DIABETIC SHOES MEASUREMENT.   Review of Systems     Objective:   Physical Exam        Assessment & Plan:

## 2014-04-02 ENCOUNTER — Encounter: Payer: Self-pay | Admitting: Internal Medicine

## 2014-04-02 ENCOUNTER — Ambulatory Visit (INDEPENDENT_AMBULATORY_CARE_PROVIDER_SITE_OTHER): Payer: Medicare Other | Admitting: Internal Medicine

## 2014-04-02 VITALS — BP 122/68 | HR 80 | Temp 98.2°F | Wt 179.4 lb

## 2014-04-02 DIAGNOSIS — M792 Neuralgia and neuritis, unspecified: Secondary | ICD-10-CM

## 2014-04-02 DIAGNOSIS — I129 Hypertensive chronic kidney disease with stage 1 through stage 4 chronic kidney disease, or unspecified chronic kidney disease: Secondary | ICD-10-CM

## 2014-04-02 DIAGNOSIS — M79604 Pain in right leg: Secondary | ICD-10-CM

## 2014-04-02 DIAGNOSIS — E1149 Type 2 diabetes mellitus with other diabetic neurological complication: Secondary | ICD-10-CM

## 2014-04-02 DIAGNOSIS — E1142 Type 2 diabetes mellitus with diabetic polyneuropathy: Secondary | ICD-10-CM

## 2014-04-02 DIAGNOSIS — M79605 Pain in left leg: Principal | ICD-10-CM

## 2014-04-02 DIAGNOSIS — M79609 Pain in unspecified limb: Secondary | ICD-10-CM

## 2014-04-02 DIAGNOSIS — N189 Chronic kidney disease, unspecified: Secondary | ICD-10-CM

## 2014-04-02 DIAGNOSIS — IMO0002 Reserved for concepts with insufficient information to code with codable children: Secondary | ICD-10-CM

## 2014-04-02 MED ORDER — OXYCODONE-ACETAMINOPHEN 5-325 MG PO TABS: 1.0000 | ORAL_TABLET | Freq: Two times a day (BID) | ORAL | Status: DC | PRN

## 2014-04-02 MED ORDER — OXYCODONE-ACETAMINOPHEN 5-325 MG PO TABS
1.0000 | ORAL_TABLET | Freq: Two times a day (BID) | ORAL | Status: DC | PRN
Start: 2014-04-02 — End: 2014-04-02

## 2014-04-02 NOTE — Progress Notes (Signed)
Patient ID: Dawn [name], female   DOB: November 07, 1928, 80 y.o.   MRN: [id number]    Chief Complaint  Patient presents with  . Follow-up    4 week f/u leg pain   Allergies  Allergen Reactions  . Keflex [Cephalexin]   . Metronidazole    HPI 80 y/o female patient is here for follow up of her leg pain. Her gabapentin was increased to 300 mg bid last visit. This has helped some but pain persists. She is also on tramadol for the pain. Standing for long time worsens the pain. No claudication. No muscle cramps. Low back discomfort with left leg pain > right leg persists  Review of Systems   Constitutional: Negative for fever, chills, weight loss, malaise/fatigue and diaphoresis.   HENT: Negative for congestion and tinnitus.    Respiratory: Negative for cough and shortness of breath.    Cardiovascular: Negative for chest pain, palpitations and leg swelling.   Genitourinary: Negative for dysuria, urgency and flank pain. Has nocturia Musculoskeletal: Positive for joint pain. Negative for myalgias.   Skin: Negative for itching and rash.   Neurological: Negative for dizziness, focal weakness, weakness and headaches.   Psychiatric/Behavioral: Negative for depression and memory loss. The patient does not have insomnia.  Past Medical History  Diagnosis Date  . Unspecified constipation   . Chronic kidney disease, stage III (moderate)   . Disorder of bone and cartilage, unspecified   . Other hammer toe (acquired)   . Anorexia   . Postmenopausal atrophic vaginitis   . Anemia, unspecified   . Reflux esophagitis   . Unspecified disorder of kidney and ureter   . Osteoarthrosis, unspecified whether generalized or localized, unspecified site   . Other and unspecified hyperlipidemia   . Cystocele, midline   . Unspecified essential hypertension   . Type II or unspecified type diabetes mellitus without mention of complication, not stated as uncontrolled   . Unspecified urinary incontinence   .  Unspecified hereditary and idiopathic peripheral neuropathy   . Obesity, unspecified   . Kyphosis associated with other condition   . Chronic kidney disease    Current Outpatient Prescriptions on File Prior to Visit  Medication Sig Dispense Refill  . aspirin 81 MG tablet Take 81 mg by mouth daily.      . calcium-vitamin D (OSCAL WITH D) 500-200 MG-UNIT per tablet Take 1 tablet by mouth daily.      . cholecalciferol (VITAMIN D) 1000 UNITS tablet Take 1,000 Units by mouth daily.      Marland Kitchen desoximetasone (TOPICORT) 0.05 % cream Apply topically 2 (two) times daily.  30 g  3  . glipiZIDE (GLUCOTROL) 5 MG tablet TAKE 1 TABLET TWICE A DAY  180 tablet  3  . linagliptin (TRADJENTA) 5 MG TABS tablet Take 1 tablet (5 mg total) by mouth daily.  90 tablet  3  . lisinopril-hydrochlorothiazide (PRINZIDE,ZESTORETIC) 20-12.5 MG per tablet Take 1 tablet by mouth daily.  90 tablet  3  . sennosides-docusate sodium (SENOKOT-S) 8.6-50 MG tablet Take 1 tablet by mouth daily.      . simvastatin (ZOCOR) 5 MG tablet Take 1 tablet (5 mg total) by mouth at bedtime.  90 tablet  3  . Tdap (BOOSTRIX) 5-2.5-18.5 LF-MCG/0.5 injection Inject 0.5 mLs into the muscle once.  0.5 mL  0  . vitamin E 400 UNIT capsule Take 400 Units by mouth daily.       No current facility-administered medications on file prior to visit.   No  past surgical history on file.  Physical exam BP 122/68  Pulse 80  Temp(Src) 98.2 F (36.8 C) (Oral)  Wt 179 lb 6.4 oz (81.375 kg)  SpO2 97%  Constitutional: She is oriented to person, place, and time. She appears well-developed and well-nourished. No distress.   HENT:   Head: Normocephalic and atraumatic.  Neck: Normal range of motion. Neck supple. No thyromegaly present.   Cardiovascular: Normal rate, regular rhythm, normal heart sounds and intact distal pulses.    Pulmonary/Chest: Effort normal and breath sounds normal.   Abdominal: Soft. Bowel sounds are normal. There is no tenderness.    Musculoskeletal: She exhibits no edema. No calf tenderness Lymphadenopathy:    She has no cervical adenopathy.  Neurological: She is alert and oriented to person, place, and time. She has normal reflexes. No cranial nerve deficit. Normal vibration and pin prick sensation Skin: Skin is warm and dry. No rash noted. She is not diaphoretic. No erythema.  Psychiatric: She has a normal mood and affect. Her behavior is normal.   Imaging  02/05/14 EXAM: LUMBAR SPINE - COMPLETE 4+ VIEW   COMPARISON:  None.   FINDINGS: The lumbar vertebrae are in normal alignment. Degenerative disc disease is present particularly at L4-5 at L5-S1 where there is loss of disc space sclerosis, spurring, and vacuum disc phenomena present. No compression deformity is seen. Degenerative change involves the facet joints particularly of L4-5 and L5-S1. The SI joints appear corticated.   IMPRESSION: Degenerative disc disease at L4-5 and L5-S1.  No acute abnormality.    Assessment/plan  1. Bilateral leg pain Will discontinue tramadol and have her on roxicet 5-325 mg 1 tab every 12 hours as needed for pain. Will get mri lumbar spine to help rule out nerve impingement and stenosis given the persistent discomfort and pain. Continue oscal and back precautions with fall precautions - MR Lumbar Spine Wo Contrast; Future  2. Neuropathic pain Continue gabapentin 300 mg bid for now  3. Hypertensive renal disease Continue lisionpril-hctz current dose and monitor bp readings - CMP; Future - Lipid Panel; Future - CBC with Differential; Future  4. DM type 2 with diabetic peripheral neuropathy Will continue her glipizide and tradjenta with statin and asa and ACEI - Hemoglobin A1c; Future

## 2014-04-04 ENCOUNTER — Other Ambulatory Visit: Payer: Self-pay | Admitting: *Deleted

## 2014-04-04 MED ORDER — GABAPENTIN 300 MG PO CAPS: ORAL_CAPSULE | ORAL | Status: DC

## 2014-04-06 IMAGING — CR DG LUMBAR SPINE COMPLETE 4+V
5 series · 5 of 5 positions shown · non-contrast
Comparison: None.

CLINICAL DATA: Back pain radiating to both legs, numbness, no
trauma

EXAM:
LUMBAR SPINE - COMPLETE 4+ VIEW

[view not recorded (1 of 5)]
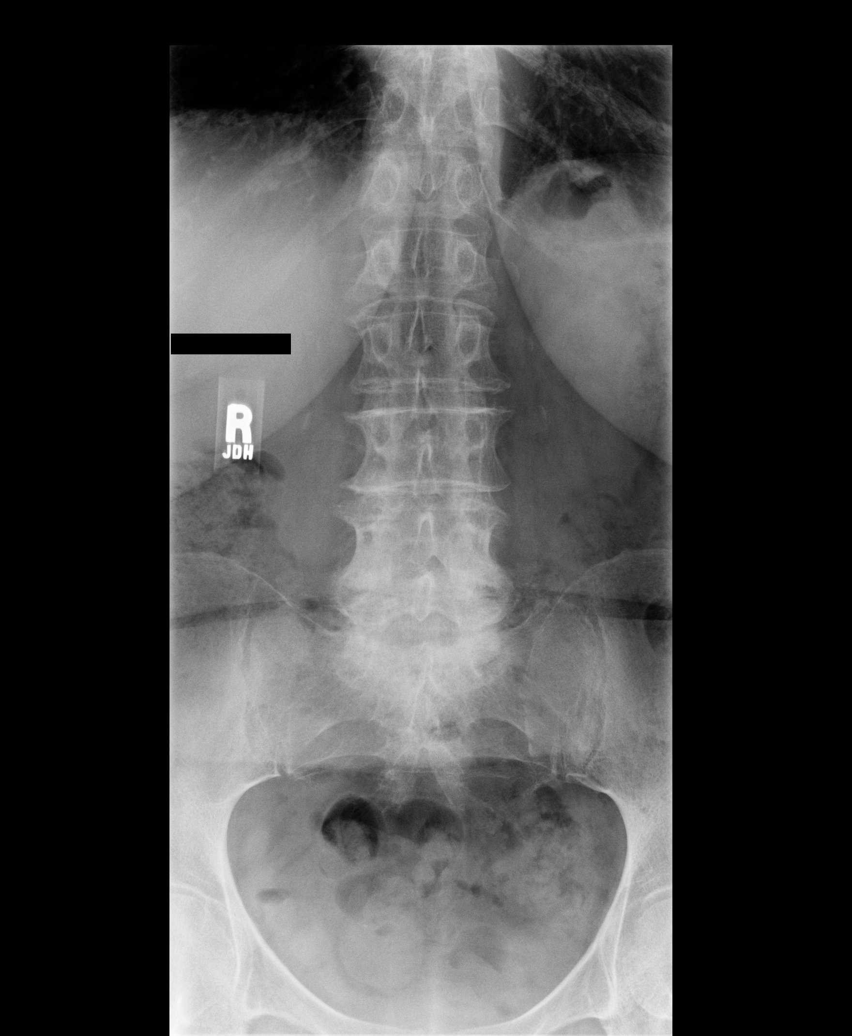

[view not recorded (2 of 5)]
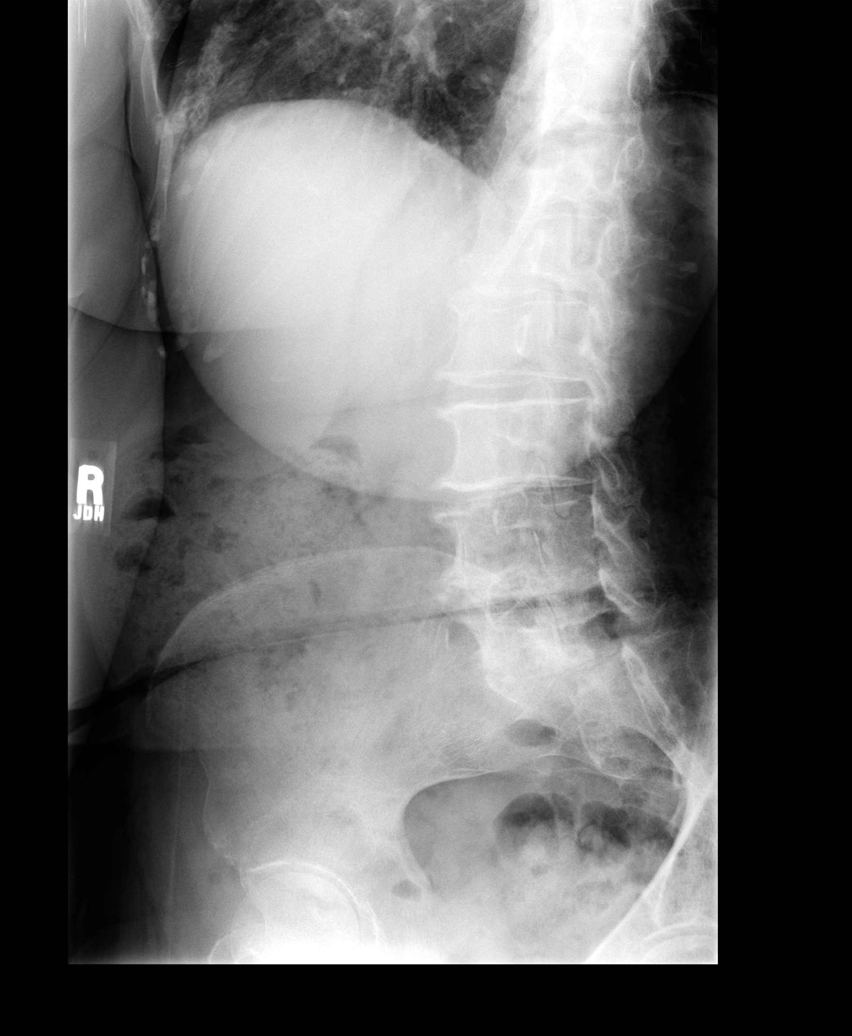

[view not recorded (3 of 5)]
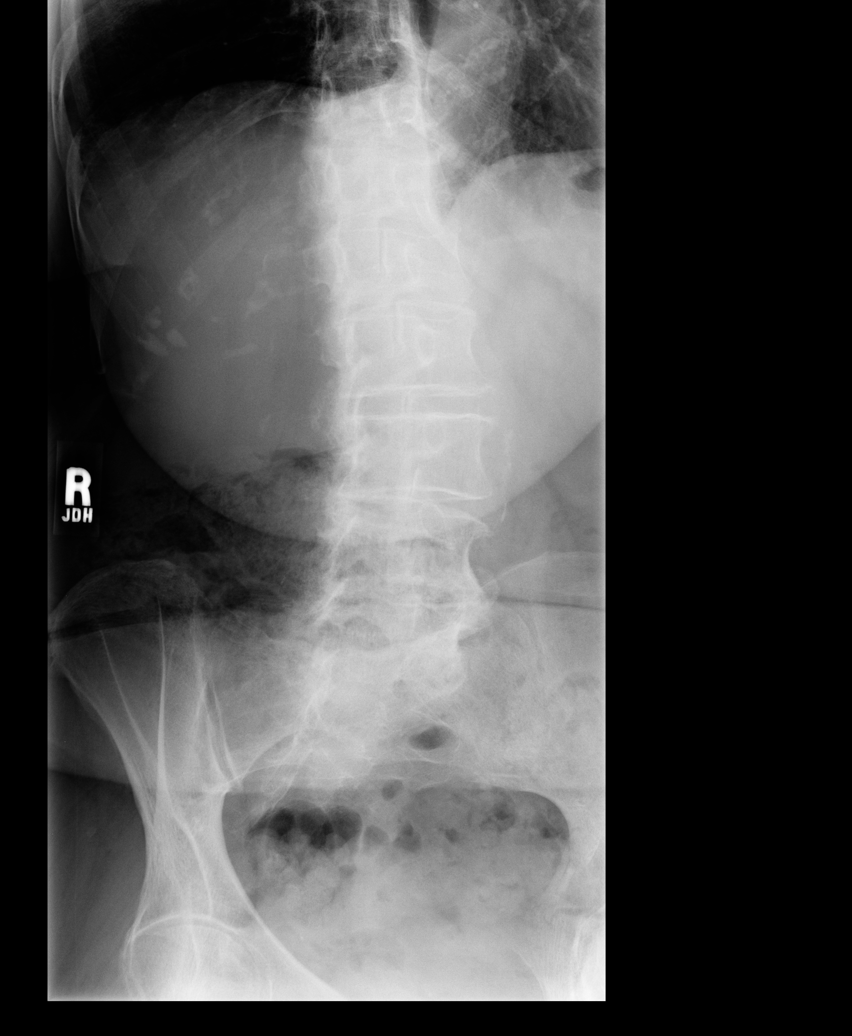

[view not recorded (4 of 5)]
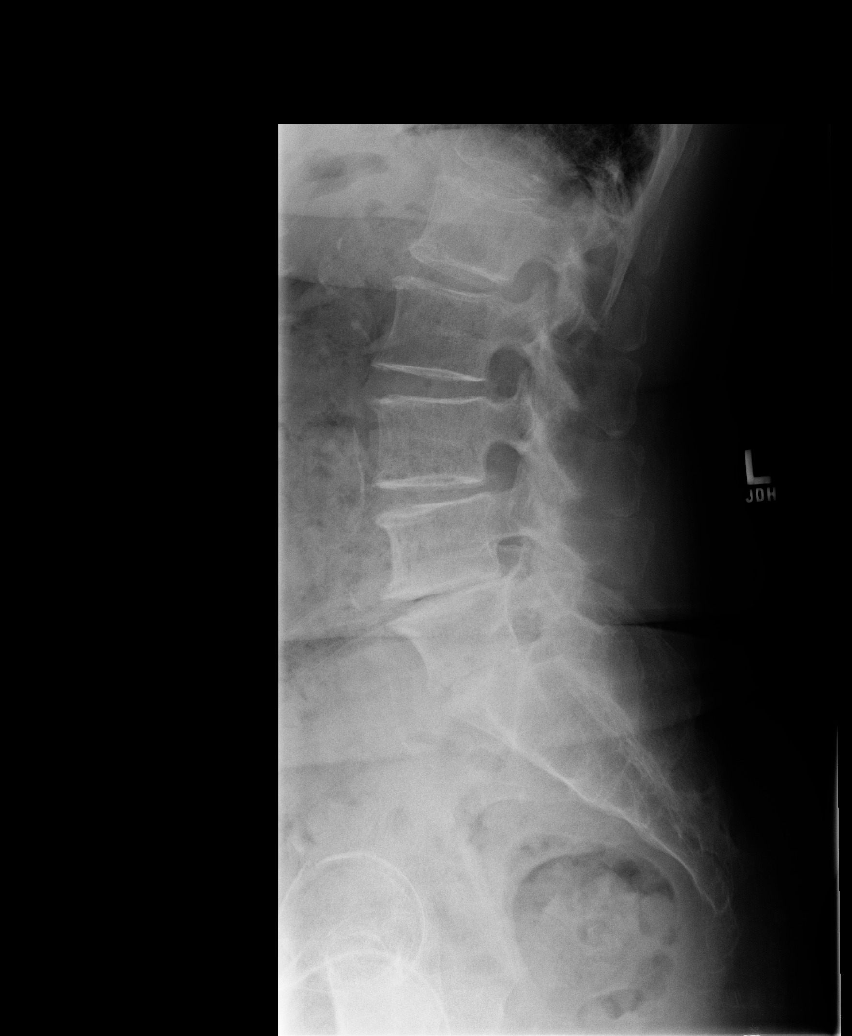

[view not recorded (5 of 5)]
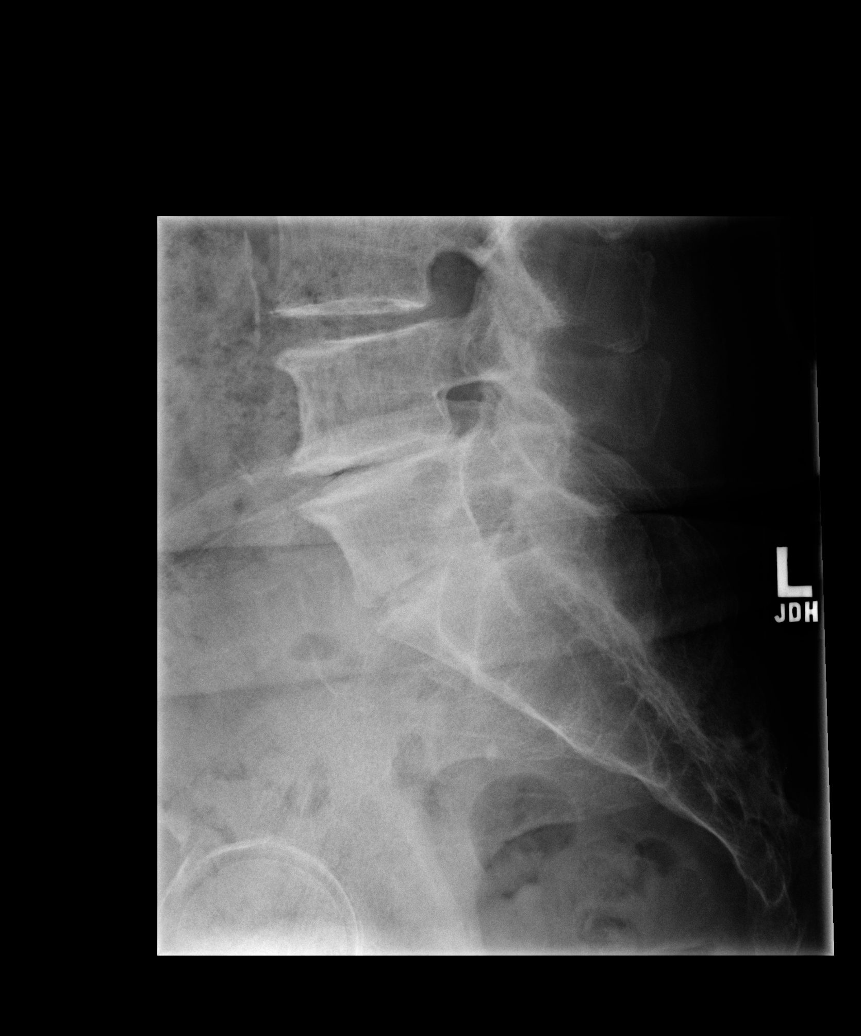

[5 of 5 positions shown; findings below may reference images not displayed]

FINDINGS: The lumbar vertebrae are in normal alignment. Degenerative disc
disease is present particularly at L4-5 at L5-S1 where there is loss
of disc space sclerosis, spurring, and vacuum disc phenomena
present. No compression deformity is seen. Degenerative change
involves the facet joints particularly of L4-5 and L5-S1. The SI
joints appear corticated.
IMPRESSION: Degenerative disc disease at L4-5 and L5-S1.  No acute abnormality.

## 2014-04-17 ENCOUNTER — Ambulatory Visit
Admission: RE | Admit: 2014-04-17 | Discharge: 2014-04-17 | Disposition: A | Discharge status: Home / Self Care | Payer: Medicare Other | Source: Ambulatory Visit | Attending: Internal Medicine | Admitting: Internal Medicine

## 2014-04-17 DIAGNOSIS — M79605 Pain in left leg: Principal | ICD-10-CM

## 2014-04-17 DIAGNOSIS — M79604 Pain in right leg: Secondary | ICD-10-CM

## 2014-04-24 ENCOUNTER — Ambulatory Visit: Payer: Medicare Other | Admitting: Podiatrist

## 2014-04-30 ENCOUNTER — Encounter: Payer: Self-pay | Admitting: Internal Medicine

## 2014-04-30 ENCOUNTER — Other Ambulatory Visit: Payer: Self-pay | Admitting: *Deleted

## 2014-04-30 ENCOUNTER — Ambulatory Visit (INDEPENDENT_AMBULATORY_CARE_PROVIDER_SITE_OTHER): Payer: Medicare Other | Admitting: Internal Medicine

## 2014-04-30 VITALS — BP 122/78 | HR 73 | Temp 97.5°F | Wt 177.8 lb

## 2014-04-30 DIAGNOSIS — M48062 Spinal stenosis, lumbar region with neurogenic claudication: Secondary | ICD-10-CM | POA: Insufficient documentation

## 2014-04-30 DIAGNOSIS — E1129 Type 2 diabetes mellitus with other diabetic kidney complication: Secondary | ICD-10-CM

## 2014-04-30 DIAGNOSIS — E1121 Type 2 diabetes mellitus with diabetic nephropathy: Secondary | ICD-10-CM

## 2014-04-30 DIAGNOSIS — N058 Unspecified nephritic syndrome with other morphologic changes: Secondary | ICD-10-CM

## 2014-04-30 MED ORDER — OXYCODONE-ACETAMINOPHEN 5-325 MG PO TABS: 1.0000 | ORAL_TABLET | Freq: Three times a day (TID) | ORAL | Status: DC | PRN

## 2014-04-30 MED ORDER — GABAPENTIN 300 MG PO CAPS: ORAL_CAPSULE | ORAL | Status: DC

## 2014-04-30 NOTE — Progress Notes (Signed)
Patient ID: Dawn [name], female   DOB: [birth year], 80 y.o.   MRN: [id number]    Chief Complaint  Patient presents with  . Follow-up    MRI result, leg pain   Allergies  Allergen Reactions  . Keflex [Cephalexin]   . Metronidazole    HPI 80 y/o female patient is here for follow up of her leg pain and MRI spine result. She is on gabapentin 300 mg bid and roxicet 5-325 q12h prn. This is helping some but pain persists mainly with movement. No muscle cramps. Bowel movement is stable with senna-s  Review of Systems   Constitutional: Negative for fever, chills, weight loss, malaise/fatigue and diaphoresis.   HENT: Negative for congestion and tinnitus.    Respiratory: Negative for cough and shortness of breath.    Cardiovascular: Negative for chest pain, palpitations and leg swelling.   Genitourinary: Negative for dysuria, urgency and flank pain. Has nocturia Musculoskeletal: Positive for joint pain. Negative for myalgias.   Skin: Negative for itching and rash.   Neurological: Negative for dizziness, focal weakness, weakness and headaches.   Psychiatric/Behavioral: Negative for depression and memory loss. The patient does not have insomnia.  Physical exam BP 122/78  Pulse 73  Temp(Src) 97.5 F (36.4 C) (Oral)  Wt 177 lb 12.8 oz (80.65 kg)  SpO2 97%  General- elderly female in no acute distress Head- atraumatic, normocephalic Cardiovascular- normal s1,s2, no murmurs/ rubs/ gallops Respiratory- bilateral clear to auscultation, no wheeze, no rhonchi, no crackles Abdomen- bowel sounds present, soft, non tender, no organomegaly Musculoskeletal- able to move all 4 extremities, no spinal and paraspinal tenderness, steady gait Neurological- no focal deficit, normal reflexes, normal muscle strength, normal sensation to fine touch and vibration Psychiatry- alert and oriented to person, place and time, normal mood and affect  Imaging MRI LUMBAR SPINE WITHOUT CONTRAST   IMPRESSION: 1. Mild  central and lateral recess stenosis bilaterally at L3-4. 2. Mild multifactorial spinal stenosis at L4-5 with mild left greater than right lateral recess and foraminal stenosis. 3. Moderate biforaminal stenosis at L5-S1 secondary to facet disease, paraspinal osteophytes and a small right-sided synovial cyst. 4. No apparent acute findings. 5. Distended endometrial cavity with T1 and T2 hyperintense fluid, likely blood. This is nonspecific and may be secondary to underlying cervical stenosis. If the patient has vaginal bleeding, further evaluation with pelvic ultrasound recommended.   Lab Results  Component Value Date   HGBA1C 6.8* 01/03/2014   Lipid Panel     Component Value Date/Time   TRIG 78 09/30/2013 0938   HDL 53 09/30/2013 0938   CHOLHDL 2.8 09/30/2013 0938   LDLCALC 80 09/30/2013 0938   CMP     Component Value Date/Time   NA 140 01/03/2014 0943   K 4.6 01/03/2014 0943   CL 100 01/03/2014 0943   CO2 22 01/03/2014 0943   GLUCOSE 120* 01/03/2014 0943   BUN 20 01/03/2014 0943   CREATININE 1.38* 01/03/2014 0943   CALCIUM 9.2 01/03/2014 0943   PROT 6.4 09/30/2013 0938   AST 23 09/30/2013 0938   ALT 16 09/30/2013 0938   ALKPHOS 96 09/30/2013 0938   BILITOT 0.5 09/30/2013 0938   GFRNONAA 35* 01/03/2014 0943   GFRAA 41* 01/03/2014 0943   CBC    Component Value Date/Time   WBC 7.5 09/30/2013 0938   RBC 4.13 09/30/2013 0938   HGB 10.7* 09/30/2013 0938   HCT 34.5 09/30/2013 0938   MCV 84 09/30/2013 0938   MCH 25.9* 09/30/2013 [id number]  MCHC 31.0* 09/30/2013 0938   RDW 15.5* 09/30/2013 0938   LYMPHSABS 1.5 09/30/2013 0938   EOSABS 0.2 09/30/2013 0938   BASOSABS 0.0 09/30/2013 0938   Assessment/plan  1. Diabetes mellitus with nephropathy Reviewed a1c from 3 monts back. Recheck a1c today and if a1c < 7, will stop glipizide - Hemoglobin A1c - CMP - CBC with Differential - Lipid Panel  2. Spinal stenosis, lumbar region, with neurogenic claudication Reviewed mri report. The  leg pain is from her lumbar spinal stenosis with neurological findings. Will increase her roxicet to q8h prn, script provided. Continue gabapentin - oxyCODONE-acetaminophen (ROXICET) 5-325 MG per tablet; Take 1 tablet by mouth every 8 (eight) hours as needed for severe pain.  Dispense: 90 tablet; Refill: 0

## 2014-04-30 NOTE — Telephone Encounter (Signed)
[facility]

## 2014-05-01 LAB — COMPREHENSIVE METABOLIC PANEL
ALT: 20 IU/L (ref 0–32)
AST: 29 IU/L (ref 0–40)
Albumin/Globulin Ratio: 1.5 (ref 1.1–2.5)
Albumin: 4.1 g/dL (ref 3.5–4.7)
Alkaline Phosphatase: 95 IU/L (ref 39–117)
BUN/Creatinine Ratio: 19 (ref 11–26)
BUN: 26 mg/dL (ref 8–27)
CALCIUM: 9.6 mg/dL (ref 8.7–10.3)
CO2: 24 mmol/L (ref 18–29)
CREATININE: 1.38 mg/dL — AB (ref 0.57–1.00)
Chloride: 106 mmol/L (ref 97–108)
GFR calc Af Amer: 40 mL/min/{1.73_m2} — ABNORMAL LOW (ref >59)
GFR calc non Af Amer: 35 mL/min/{1.73_m2} — ABNORMAL LOW (ref >59)
GLOBULIN, TOTAL: 2.7 g/dL (ref 1.5–4.5)
GLUCOSE: 102 mg/dL — AB (ref 65–99)
Potassium: 4.6 mmol/L (ref 3.5–5.2)
Sodium: 144 mmol/L (ref 134–144)
TOTAL PROTEIN: 6.8 g/dL (ref 6.0–8.5)
Total Bilirubin: 0.4 mg/dL (ref 0.0–1.2)

## 2014-05-01 LAB — CBC WITH DIFFERENTIAL/PLATELET
Basophils Absolute: 0 10*3/uL (ref 0.0–0.2)
Basos: 1 %
Eos: 3 %
Eosinophils Absolute: 0.2 10*3/uL (ref 0.0–0.4)
HCT: 35.1 % (ref 34.0–46.6)
HEMOGLOBIN: 10.8 g/dL — AB (ref 11.1–15.9)
Immature Grans (Abs): 0 10*3/uL (ref 0.0–0.1)
Immature Granulocytes: 0 %
Lymphocytes Absolute: 1.2 10*3/uL (ref 0.7–3.1)
Lymphs: 19 %
MCH: 26.2 pg — AB (ref 26.6–33.0)
MCHC: 30.8 g/dL — ABNORMAL LOW (ref 31.5–35.7)
MCV: 85 fL (ref 79–97)
MONOS ABS: 0.5 10*3/uL (ref 0.1–0.9)
Monocytes: 8 %
NEUTROS ABS: 4.3 10*3/uL (ref 1.4–7.0)
Neutrophils Relative %: 69 %
RBC: 4.13 x10E6/uL (ref 3.77–5.28)
RDW: 15.6 % — ABNORMAL HIGH (ref 12.3–15.4)
WBC: 6.1 10*3/uL (ref 3.4–10.8)

## 2014-05-01 LAB — LIPID PANEL
CHOLESTEROL TOTAL: 133 mg/dL (ref 100–199)
Chol/HDL Ratio: 2.7 ratio units (ref 0.0–4.4)
HDL: 49 mg/dL (ref >39)
LDL CALC: 66 mg/dL (ref 0–99)
Triglycerides: 90 mg/dL (ref 0–149)
VLDL CHOLESTEROL CAL: 18 mg/dL (ref 5–40)

## 2014-05-01 LAB — HEMOGLOBIN A1C
ESTIMATED AVERAGE GLUCOSE: 154 mg/dL
HEMOGLOBIN A1C: 7 % — AB (ref 4.8–5.6)

## 2014-05-07 ENCOUNTER — Encounter: Payer: Self-pay | Admitting: Internal Medicine

## 2014-05-08 ENCOUNTER — Ambulatory Visit: Payer: Medicare Other | Admitting: Podiatrist

## 2014-05-19 ENCOUNTER — Ambulatory Visit (INDEPENDENT_AMBULATORY_CARE_PROVIDER_SITE_OTHER): Payer: Medicare Other | Admitting: Podiatrist

## 2014-05-19 ENCOUNTER — Encounter: Payer: Self-pay | Admitting: Podiatrist

## 2014-05-19 VITALS — BP 125/67 | HR 82 | Resp 15 | Ht 66.0 in | Wt 175.0 lb

## 2014-05-19 DIAGNOSIS — E1149 Type 2 diabetes mellitus with other diabetic neurological complication: Secondary | ICD-10-CM

## 2014-05-19 DIAGNOSIS — B351 Tinea unguium: Secondary | ICD-10-CM

## 2014-05-19 DIAGNOSIS — M79609 Pain in unspecified limb: Secondary | ICD-10-CM

## 2014-05-19 DIAGNOSIS — E1121 Type 2 diabetes mellitus with diabetic nephropathy: Secondary | ICD-10-CM

## 2014-05-19 DIAGNOSIS — N058 Unspecified nephritic syndrome with other morphologic changes: Secondary | ICD-10-CM

## 2014-05-19 DIAGNOSIS — E1142 Type 2 diabetes mellitus with diabetic polyneuropathy: Secondary | ICD-10-CM

## 2014-05-19 DIAGNOSIS — M2040 Other hammer toe(s) (acquired), unspecified foot: Secondary | ICD-10-CM

## 2014-05-19 DIAGNOSIS — E1129 Type 2 diabetes mellitus with other diabetic kidney complication: Secondary | ICD-10-CM

## 2014-05-19 NOTE — Progress Notes (Signed)
  HPI: Patient presents today for follow up of diabetic foot and nail care and for dispensing of her diabetic shoes.. Past medical history, meds, and allergies reviewed. Patient states blood sugar is under good control. Patient states the pain she has been experiencing on the right great toenail has gone away.  Objective: Neurovascular status unchanged with palpable pedal pulses at 1 out of 4 dp and pt  neurological exam reveals protective sensation to be intact. absent pre-ulcerative lesions. Toenails are elongated, incurvated, discolored, dystrophic with ingrown deformity present. Patient relates pain at Left 2nd and 3rd toe due to rub on shoes. No redness or swelling, no sign of infection present. Patient's toes are contracted and elongated.  Assessment: Diabetes , Ingrown nail deformity,   Plan: Discussed treatment options and alternatives. Debrided nails without complication. Diabetic shoes and a to fit and contour nicely. She states that they're comfortable. I will see her back in 3 months for continued care.

## 2014-05-19 NOTE — Patient Instructions (Signed)
Diabetes and Foot Care Diabetes may cause you to have problems because of poor blood supply (circulation) to your feet and legs. This may cause the skin on your feet to become thinner, break easier, and heal more slowly. Your skin may become dry, and the skin may peel and crack. You may also have nerve damage in your legs and feet causing decreased feeling in them. You may not notice minor injuries to your feet that could lead to infections or more serious problems. Taking care of your feet is one of the most important things you can do for yourself.  HOME CARE INSTRUCTIONS  Wear shoes at all times, even in the house. Do not go barefoot. Bare feet are easily injured.  Check your feet daily for blisters, cuts, and redness. If you cannot see the bottom of your feet, use a mirror or ask someone for help.  Wash your feet with warm water (do not use hot water) and mild soap. Then pat your feet and the areas between your toes until they are completely dry. Do not soak your feet as this can dry your skin.  Apply a moisturizing lotion or petroleum jelly (that does not contain alcohol and is unscented) to the skin on your feet and to dry, brittle toenails. Do not apply lotion between your toes.  Trim your toenails straight across. Do not dig under them or around the cuticle. File the edges of your nails with an emery board or nail file.  Do not cut corns or calluses or try to remove them with medicine.  Wear clean socks or stockings every day. Make sure they are not too tight. Do not wear knee-high stockings since they may decrease blood flow to your legs.  Wear shoes that fit properly and have enough cushioning. To break in new shoes, wear them for just a few hours a day. This prevents you from injuring your feet. Always look in your shoes before you put them on to be sure there are no objects inside.  Do not cross your legs. This may decrease the blood flow to your feet.  If you find a minor scrape,  cut, or break in the skin on your feet, keep it and the skin around it clean and dry. These areas may be cleansed with mild soap and water. Do not cleanse the area with peroxide, alcohol, or iodine.  When you remove an adhesive bandage, be sure not to damage the skin around it.  If you have a wound, look at it several times a day to make sure it is healing.  Do not use heating pads or hot water bottles. They may burn your skin. If you have lost feeling in your feet or legs, you may not know it is happening until it is too late.  Make sure your health care provider performs a complete foot exam at least annually or more often if you have foot problems. Report any cuts, sores, or bruises to your health care provider immediately. SEEK MEDICAL CARE IF:   You have an injury that is not healing.  You have cuts or breaks in the skin.  You have an ingrown nail.  You notice redness on your legs or feet.  You feel burning or tingling in your legs or feet.  You have pain or cramps in your legs and feet.  Your legs or feet are numb.  Your feet always feel cold. SEEK IMMEDIATE MEDICAL CARE IF:   There is increasing redness,   swelling, or pain in or around a wound.  There is a red line that goes up your leg.  Pus is coming from a wound.  You develop a fever or as directed by your health care provider.  You notice a bad smell coming from an ulcer or wound. Document Released: 11/25/2000 Document Revised: 07/31/2013 Document Reviewed: 05/07/2013 [employer] Patient Information 2014 [employer].  

## 2014-06-11 ENCOUNTER — Other Ambulatory Visit: Payer: Self-pay | Admitting: *Deleted

## 2014-06-11 DIAGNOSIS — M48062 Spinal stenosis, lumbar region with neurogenic claudication: Secondary | ICD-10-CM

## 2014-06-11 MED ORDER — OXYCODONE-ACETAMINOPHEN 5-325 MG PO TABS: 1.0000 | ORAL_TABLET | Freq: Three times a day (TID) | ORAL | Status: DC | PRN

## 2014-07-29 ENCOUNTER — Ambulatory Visit (INDEPENDENT_AMBULATORY_CARE_PROVIDER_SITE_OTHER): Payer: Medicare Other | Admitting: Internal Medicine

## 2014-07-29 ENCOUNTER — Encounter: Payer: Self-pay | Admitting: Internal Medicine

## 2014-07-29 VITALS — BP 136/70 | HR 70 | Temp 98.1°F | Wt 181.8 lb

## 2014-07-29 DIAGNOSIS — E1129 Type 2 diabetes mellitus with other diabetic kidney complication: Secondary | ICD-10-CM

## 2014-07-29 DIAGNOSIS — K5909 Other constipation: Secondary | ICD-10-CM

## 2014-07-29 DIAGNOSIS — Z23 Encounter for immunization: Secondary | ICD-10-CM

## 2014-07-29 DIAGNOSIS — E1121 Type 2 diabetes mellitus with diabetic nephropathy: Secondary | ICD-10-CM

## 2014-07-29 DIAGNOSIS — Z1211 Encounter for screening for malignant neoplasm of colon: Secondary | ICD-10-CM

## 2014-07-29 DIAGNOSIS — K5903 Drug induced constipation: Secondary | ICD-10-CM

## 2014-07-29 DIAGNOSIS — N058 Unspecified nephritic syndrome with other morphologic changes: Secondary | ICD-10-CM

## 2014-07-29 DIAGNOSIS — E785 Hyperlipidemia, unspecified: Secondary | ICD-10-CM

## 2014-07-29 DIAGNOSIS — T402X5A Adverse effect of other opioids, initial encounter: Secondary | ICD-10-CM

## 2014-07-29 DIAGNOSIS — I10 Essential (primary) hypertension: Secondary | ICD-10-CM

## 2014-07-29 DIAGNOSIS — E1142 Type 2 diabetes mellitus with diabetic polyneuropathy: Secondary | ICD-10-CM

## 2014-07-29 DIAGNOSIS — T40605A Adverse effect of unspecified narcotics, initial encounter: Secondary | ICD-10-CM

## 2014-07-29 DIAGNOSIS — R635 Abnormal weight gain: Secondary | ICD-10-CM

## 2014-07-29 DIAGNOSIS — E1149 Type 2 diabetes mellitus with other diabetic neurological complication: Secondary | ICD-10-CM

## 2014-07-29 DIAGNOSIS — M48062 Spinal stenosis, lumbar region with neurogenic claudication: Secondary | ICD-10-CM

## 2014-07-29 MED ORDER — LUBIPROSTONE 24 MCG PO CAPS: 24.0000 ug | ORAL_CAPSULE | Freq: Two times a day (BID) | ORAL | Status: DC

## 2014-07-29 MED ORDER — ZOSTER VACCINE LIVE 19400 UNT/0.65ML ~~LOC~~ SOLR: 0.6500 mL | Freq: Once | SUBCUTANEOUS | Status: DC

## 2014-07-29 MED ORDER — GLIPIZIDE 5 MG PO TABS: ORAL_TABLET | ORAL | Status: DC

## 2014-07-29 MED ORDER — OXYCODONE-ACETAMINOPHEN 5-325 MG PO TABS: 1.0000 | ORAL_TABLET | Freq: Three times a day (TID) | ORAL | Status: DC | PRN

## 2014-07-29 NOTE — Progress Notes (Signed)
Patient ID: Dawn [name], female   DOB: 1928-07-26, 80 y.o.   MRN: [id number]    Chief Complaint  Patient presents with  . Follow-up    3  month f/u (no labs)   Allergies  Allergen Reactions  . Keflex [Cephalexin]   . Metronidazole   . Simvastatin     Leg cramsps   HPI 80 y/o female patient is here for routine follow up. She complaints of constipation. She is taking senna s 2 tab instead of one works better for her. Has failed linzess in past. Has not been able to tolerate miralax with excessive loose stool. Has to strain a lot. Denies rectal pain She has stopped simvastatin with her leg pain and feels somewhat better but the leg pain persists after standing for long time. Gabapentin bid and roxicet bid has been helping her pain. Has not required roxicet more than twice a day. Standing for long time worsens the pain. No claudication. No muscle cramps. Denies back pain Has gained 6 lbs since last visit Does not exercise Tries to eat healthy- more vegetables, chicken and fish cbg 107 this am fasting cbg average 100-136  Wt Readings from Last 3 Encounters:  07/29/14 181 lb 12.8 oz (82.464 kg)  05/19/14 175 lb (79.379 kg)  04/30/14 177 lb 12.8 oz (80.65 kg)   Review of Systems  Constitutional: Negative for fever, chills, malaise/fatigue and diaphoresis.  HENT: Negative for congestion, hearing loss and sore throat.   Eyes: Negative for blurred vision, double vision and discharge. wears glasses Respiratory: Negative for cough, sputum production, shortness of breath and wheezing.   Cardiovascular: Negative for chest pain, palpitations, orthopnea and leg swelling.  Gastrointestinal: Negative for heartburn, nausea, vomiting, abdominal pain, diarrhea, melena, rectal bleed Genitourinary: Negative for dysuria, urgency, frequency and flank pain.  Musculoskeletal: Negative for back pain, falls recently. She had slipped in bathroom and fell a week after last routine visit. No falls after thant    Skin: Negative for itching and rash.  Neurological: Negative for dizziness, tingling, focal weakness and headaches.  Psychiatric/Behavioral: Negative for depression   Past Medical History  Diagnosis Date  . Unspecified constipation   . Chronic kidney disease, stage III (moderate)   . Disorder of bone and cartilage, unspecified   . Other hammer toe (acquired)   . Anorexia   . Postmenopausal atrophic vaginitis   . Anemia, unspecified   . Reflux esophagitis   . Unspecified disorder of kidney and ureter   . Osteoarthrosis, unspecified whether generalized or localized, unspecified site   . Other and unspecified hyperlipidemia   . Cystocele, midline   . Unspecified essential hypertension   . Type II or unspecified type diabetes mellitus without mention of complication, not stated as uncontrolled   . Unspecified urinary incontinence   . Unspecified hereditary and idiopathic peripheral neuropathy   . Obesity, unspecified   . Kyphosis associated with other condition   . Chronic kidney disease    Current Outpatient Prescriptions on File Prior to Visit  Medication Sig Dispense Refill  . aspirin 81 MG tablet Take 81 mg by mouth daily.      . calcium-vitamin D (OSCAL WITH D) 500-200 MG-UNIT per tablet Take 1 tablet by mouth daily.      . cholecalciferol (VITAMIN D) 1000 UNITS tablet Take 1,000 Units by mouth daily.      Marland Kitchen desoximetasone (TOPICORT) 0.05 % cream Apply topically 2 (two) times daily.  30 g  3  . gabapentin (NEURONTIN) 300 MG  capsule Take one tablet by mouth in the morning and one tablet by mouth in the evening for pains  180 capsule  3  . glipiZIDE (GLUCOTROL) 5 MG tablet 1 by mouth daily as of 05/07/14      . linagliptin (TRADJENTA) 5 MG TABS tablet Take 1 tablet (5 mg total) by mouth daily.  90 tablet  3  . lisinopril-hydrochlorothiazide (PRINZIDE,ZESTORETIC) 20-12.5 MG per tablet Take 1 tablet by mouth daily.  90 tablet  3  . oxyCODONE-acetaminophen (ROXICET) 5-325 MG per  tablet Take 1 tablet by mouth every 8 (eight) hours as needed for severe pain.  90 tablet  0  . sennosides-docusate sodium (SENOKOT-S) 8.6-50 MG tablet Take 1 tablet by mouth daily.      . vitamin E 400 UNIT capsule Take 400 Units by mouth daily.       No current facility-administered medications on file prior to visit.    Physical exam BP 136/70  Pulse 70  Temp(Src) 98.1 F (36.7 C) (Oral)  Wt 181 lb 12.8 oz (82.464 kg)  SpO2 97%  General- elderly female in no acute distress Head- atraumatic, normocephalic Cardiovascular- normal s1,s2, no murmurs/ rubs/ gallops Respiratory- bilateral clear to auscultation, no wheeze, no rhonchi, no crackles Abdomen- bowel sounds present, soft, non tender, no organomegaly Musculoskeletal- able to move all 4 extremities, no spinal and paraspinal tenderness, steady gait Neurological- no focal deficit, normal reflexes, normal muscle strength, normal sensation to fine touch and vibration Psychiatry- alert and oriented to person, place and time, normal mood and affect  Imaging MRI LUMBAR SPINE WITHOUT CONTRAST   IMPRESSION: 1. Mild central and lateral recess stenosis bilaterally at L3-4. 2. Mild multifactorial spinal stenosis at L4-5 with mild left greater than right lateral recess and foraminal stenosis. 3. Moderate biforaminal stenosis at L5-S1 secondary to facet disease, paraspinal osteophytes and a small right-sided synovial cyst. 4. No apparent acute findings. 5. Distended endometrial cavity with T1 and T2 hyperintense fluid, likely blood. This is nonspecific and may be secondary to underlying cervical stenosis. If the patient has vaginal bleeding, further evaluation with pelvic ultrasound recommended.  Labs-  Lab Results  Component Value Date   HGBA1C 7.0* 04/30/2014   No results found for this basename: TSH   Lipid Panel     Component Value Date/Time   TRIG 90 04/30/2014 1049   HDL 49 04/30/2014 1049   CHOLHDL 2.7 04/30/2014 1049    LDLCALC 66 04/30/2014 1049     assessment/plan  1. Spinal stenosis, lumbar region, with neurogenic claudication - oxyCODONE-acetaminophen (ROXICET) 5-325 MG per tablet; Take 1 tablet by mouth every 8 (eight) hours as needed for severe pain.  Dispense: 90 tablet; Refill: 0  2. Benign essential HTN Stable, continue lisinopril-hctz for now  3. DM type 2 with diabetic peripheral neuropathy Continue glipizide and tradjenta. Check a1c and urine microalbumin. Continue ACEI , ASA - Hemoglobin A1c - Microalbumin/Creatinine Ratio, Urine  4. Other and unspecified hyperlipidemia Lipid Panel     Component Value Date/Time   TRIG 90 04/30/2014 1049   HDL 49 04/30/2014 1049   CHOLHDL 2.7 04/30/2014 1049   LDLCALC 66 04/30/2014 1049  stable, off statin  5. Type 2 diabetes mellitus with diabetic nephropathy Monitor renal function - Microalbumin/Creatinine Ratio, Urine  6. Weight gain Check thyroid function with weight gain and her constipation - TSH  7. Therapeutic opioid induced constipation Will have her on amitiza 24 mcg bid for now and reassess in 4 weeks  8. Need for  prophylactic vaccination and inoculation against other viral diseases(V04.89) - zoster vaccine live, PF, (ZOSTAVAX) 10272 UNT/0.65ML injection; Inject 19,400 Units into the skin once.  Dispense: 1 each; Refill: 0  9. Screening for colon cancer - Fecal occult blood, imunochemical; Future  10. Need for prophylactic vaccination against Streptococcus pneumoniae (pneumococcus) - Pneumococcal conjugate vaccine 13-valent

## 2014-07-30 LAB — HEMOGLOBIN A1C
ESTIMATED AVERAGE GLUCOSE: 151 mg/dL
HEMOGLOBIN A1C: 6.9 % — AB (ref 4.8–5.6)

## 2014-07-30 LAB — TSH: TSH: 2.16 u[IU]/mL (ref 0.450–4.500)

## 2014-07-31 ENCOUNTER — Encounter: Payer: Self-pay | Admitting: *Deleted

## 2014-07-31 ENCOUNTER — Other Ambulatory Visit: Payer: Medicare Other

## 2014-07-31 DIAGNOSIS — Z1211 Encounter for screening for malignant neoplasm of colon: Secondary | ICD-10-CM

## 2014-08-01 LAB — MICROALBUMIN / CREATININE URINE RATIO
Creatinine, Ur: 73.2 mg/dL (ref 15.0–278.0)
Microalbumin, Urine: <3 ug/mL (ref 0.0–17.0)

## 2014-08-03 LAB — FECAL OCCULT BLOOD, IMMUNOCHEMICAL: Fecal Occult Bld: NEGATIVE

## 2014-08-06 ENCOUNTER — Encounter: Payer: Self-pay | Admitting: *Deleted

## 2014-08-06 ENCOUNTER — Telehealth: Payer: Self-pay | Admitting: *Deleted

## 2014-08-06 NOTE — Telephone Encounter (Signed)
Patient called and stated that Amitiza is not working and would like to know if there is something else she can do. Please Advise.

## 2014-08-07 NOTE — Telephone Encounter (Signed)
It has only been a week of her taking it. i want her to take this twice a day as advised for 4 weeks and reassess in office visit. If no improvement noted in 4 weeks time, will look into other option. Encourage hydration

## 2014-08-08 MED ORDER — LUBIPROSTONE 24 MCG PO CAPS: 24.0000 ug | ORAL_CAPSULE | Freq: Two times a day (BID) | ORAL | Status: DC

## 2014-08-08 NOTE — Telephone Encounter (Signed)
Patient Notified and agreed and Rx faxed to pharmacy.

## 2014-08-21 ENCOUNTER — Encounter: Payer: Self-pay | Admitting: Podiatrist

## 2014-08-21 ENCOUNTER — Ambulatory Visit (INDEPENDENT_AMBULATORY_CARE_PROVIDER_SITE_OTHER): Payer: Medicare Other | Admitting: Podiatrist

## 2014-08-21 VITALS — BP 132/86 | HR 69 | Resp 12

## 2014-08-21 DIAGNOSIS — M79609 Pain in unspecified limb: Secondary | ICD-10-CM

## 2014-08-21 DIAGNOSIS — E1142 Type 2 diabetes mellitus with diabetic polyneuropathy: Secondary | ICD-10-CM

## 2014-08-21 DIAGNOSIS — M79676 Pain in unspecified toe(s): Principal | ICD-10-CM

## 2014-08-21 DIAGNOSIS — B351 Tinea unguium: Secondary | ICD-10-CM

## 2014-08-21 NOTE — Progress Notes (Signed)
HPI: Patient presents today for follow up of diabetic foot and nail care and for dispensing of her diabetic shoes.. Past medical history, meds, and allergies reviewed. Patient states blood sugar is under good control. Patient states the pain she has been experiencing on the right great toenail has gone away.  Objective: Neurovascular status unchanged with palpable pedal pulses at 1 out of 4 dp and pt  neurological exam reveals protective sensation to be intact. absent pre-ulcerative lesions. Toenails are elongated, incurvated, discolored, dystrophic with ingrown deformity present. Patient relates pain at Left 2nd and 3rd toe due to rub on shoes. No redness or swelling, no sign of infection present. Patient's toes are contracted and elongated.  Assessment: Diabetes , Ingrown nail deformity,  Plan: Discussed treatment options and alternatives. Debrided nails without complication.

## 2014-09-17 ENCOUNTER — Other Ambulatory Visit: Payer: Self-pay | Admitting: *Deleted

## 2014-09-17 DIAGNOSIS — M48062 Spinal stenosis, lumbar region with neurogenic claudication: Secondary | ICD-10-CM

## 2014-09-17 MED ORDER — OXYCODONE-ACETAMINOPHEN 5-325 MG PO TABS: 1.0000 | ORAL_TABLET | Freq: Three times a day (TID) | ORAL | Status: DC | PRN

## 2014-09-17 NOTE — Telephone Encounter (Signed)
Patient Requested and will pick up 

## 2014-09-30 ENCOUNTER — Ambulatory Visit (INDEPENDENT_AMBULATORY_CARE_PROVIDER_SITE_OTHER): Payer: Medicare Other

## 2014-09-30 ENCOUNTER — Other Ambulatory Visit: Payer: Self-pay | Admitting: *Deleted

## 2014-09-30 DIAGNOSIS — Z23 Encounter for immunization: Secondary | ICD-10-CM

## 2014-09-30 MED ORDER — LINAGLIPTIN 5 MG PO TABS: 5.0000 mg | ORAL_TABLET | Freq: Every day | ORAL | Status: DC

## 2014-10-30 ENCOUNTER — Other Ambulatory Visit: Payer: Self-pay | Admitting: *Deleted

## 2014-10-30 DIAGNOSIS — M48062 Spinal stenosis, lumbar region with neurogenic claudication: Secondary | ICD-10-CM

## 2014-10-30 MED ORDER — OXYCODONE-ACETAMINOPHEN 5-325 MG PO TABS: 1.0000 | ORAL_TABLET | Freq: Three times a day (TID) | ORAL | Status: DC | PRN

## 2014-10-30 MED ORDER — LISINOPRIL-HYDROCHLOROTHIAZIDE 20-12.5 MG PO TABS: ORAL_TABLET | ORAL | Status: DC

## 2014-10-30 NOTE — Telephone Encounter (Signed)
Patient called and Requested and will pick up the Oxy. Lisinopril faxed to [facility]

## 2014-11-04 ENCOUNTER — Other Ambulatory Visit: Payer: Self-pay | Admitting: *Deleted

## 2014-11-04 DIAGNOSIS — I159 Secondary hypertension, unspecified: Secondary | ICD-10-CM

## 2014-11-04 DIAGNOSIS — Z0000 Encounter for general adult medical examination without abnormal findings: Secondary | ICD-10-CM

## 2014-11-04 DIAGNOSIS — E1110 Type 2 diabetes mellitus with ketoacidosis without coma: Secondary | ICD-10-CM

## 2014-11-18 ENCOUNTER — Encounter: Payer: Medicare Other | Admitting: Internal Medicine

## 2014-11-20 ENCOUNTER — Ambulatory Visit (INDEPENDENT_AMBULATORY_CARE_PROVIDER_SITE_OTHER): Payer: Medicare Other | Admitting: Podiatrist

## 2014-11-20 DIAGNOSIS — B351 Tinea unguium: Secondary | ICD-10-CM

## 2014-11-20 DIAGNOSIS — M79676 Pain in unspecified toe(s): Secondary | ICD-10-CM

## 2014-11-20 NOTE — Progress Notes (Signed)
HPI: Patient presents today for follow up of diabetic foot and nail care and for dispensing of her diabetic shoes.. Past medical history, meds, and allergies reviewed. Patient states blood sugar is under good control. Patient states the pain she has been experiencing on the right great toenail has gone away.  Objective: Neurovascular status unchanged with palpable pedal pulses at 1 out of 4 dp and pt  neurological exam reveals protective sensation to be intact. absent pre-ulcerative lesions. Toenails are elongated, incurvated, discolored, dystrophic with ingrown deformity present. Patient relates pain at Left 2nd and 3rd toe due to rub on shoes. No redness or swelling, no sign of infection present. Patient's toes are contracted and elongated.  Assessment: Diabetes , Ingrown nail deformity,  Plan: Discussed treatment options and alternatives. Debrided nails without complication.

## 2014-12-08 ENCOUNTER — Other Ambulatory Visit: Payer: Self-pay | Admitting: *Deleted

## 2014-12-08 DIAGNOSIS — M48062 Spinal stenosis, lumbar region with neurogenic claudication: Secondary | ICD-10-CM

## 2014-12-08 MED ORDER — LUBIPROSTONE 24 MCG PO CAPS: 24.0000 ug | ORAL_CAPSULE | Freq: Two times a day (BID) | ORAL | Status: DC

## 2014-12-08 MED ORDER — OXYCODONE-ACETAMINOPHEN 5-325 MG PO TABS: 1.0000 | ORAL_TABLET | Freq: Three times a day (TID) | ORAL | Status: DC | PRN

## 2014-12-19 ENCOUNTER — Other Ambulatory Visit: Payer: Self-pay | Admitting: Internal Medicine

## 2015-01-02 ENCOUNTER — Other Ambulatory Visit: Payer: Self-pay

## 2015-01-05 ENCOUNTER — Other Ambulatory Visit: Payer: Self-pay

## 2015-01-06 ENCOUNTER — Ambulatory Visit (INDEPENDENT_AMBULATORY_CARE_PROVIDER_SITE_OTHER): Payer: Medicare Other | Admitting: Internal Medicine

## 2015-01-06 ENCOUNTER — Other Ambulatory Visit: Payer: Medicare Other

## 2015-01-06 ENCOUNTER — Encounter: Payer: Self-pay | Admitting: Internal Medicine

## 2015-01-06 VITALS — BP 122/68 | HR 90 | Temp 97.7°F | Resp 10 | Ht 66.0 in | Wt 173.0 lb

## 2015-01-06 DIAGNOSIS — T402X5A Adverse effect of other opioids, initial encounter: Secondary | ICD-10-CM

## 2015-01-06 DIAGNOSIS — M8580 Other specified disorders of bone density and structure, unspecified site: Secondary | ICD-10-CM | POA: Insufficient documentation

## 2015-01-06 DIAGNOSIS — K5909 Other constipation: Secondary | ICD-10-CM

## 2015-01-06 DIAGNOSIS — E782 Mixed hyperlipidemia: Secondary | ICD-10-CM | POA: Insufficient documentation

## 2015-01-06 DIAGNOSIS — M4806 Spinal stenosis, lumbar region: Secondary | ICD-10-CM

## 2015-01-06 DIAGNOSIS — E785 Hyperlipidemia, unspecified: Secondary | ICD-10-CM

## 2015-01-06 DIAGNOSIS — Z0000 Encounter for general adult medical examination without abnormal findings: Secondary | ICD-10-CM

## 2015-01-06 DIAGNOSIS — M48062 Spinal stenosis, lumbar region with neurogenic claudication: Secondary | ICD-10-CM

## 2015-01-06 DIAGNOSIS — K5903 Drug induced constipation: Secondary | ICD-10-CM

## 2015-01-06 DIAGNOSIS — I4510 Unspecified right bundle-branch block: Secondary | ICD-10-CM

## 2015-01-06 DIAGNOSIS — I493 Ventricular premature depolarization: Secondary | ICD-10-CM

## 2015-01-06 DIAGNOSIS — I159 Secondary hypertension, unspecified: Secondary | ICD-10-CM

## 2015-01-06 DIAGNOSIS — I10 Essential (primary) hypertension: Secondary | ICD-10-CM

## 2015-01-06 DIAGNOSIS — E1110 Type 2 diabetes mellitus with ketoacidosis without coma: Secondary | ICD-10-CM

## 2015-01-06 DIAGNOSIS — E1121 Type 2 diabetes mellitus with diabetic nephropathy: Secondary | ICD-10-CM

## 2015-01-06 MED ORDER — AMBULATORY NON FORMULARY MEDICATION: Status: DC

## 2015-01-06 NOTE — Progress Notes (Signed)
Patient ID: Dawn [name], female   DOB: 04-May-1928, 81 y.o.   MRN: [id number]    Chief Complaint  Patient presents with  . Annual Exam    Yearly check-up, MMSE (29/30, passed clock drawing) , labs collected today (will result later)   . Medication Management    Discuss Amitiza- ? if causing gas and stomach upset   . Orders    Discuss order for wheelchair    Allergies  Allergen Reactions  . Keflex [Cephalexin]   . Metronidazole   . Simvastatin     Leg cramsps    HPI:  81 year old patient is here for annual exam. Pending pneumovac 23 after 8/16 PHQ-9 score 0 Had 1 fall  cbg reading at home 88-131 Feels gasey after taking amitiza but it has helped with her bowel movement bp readings stable at home Complaint with her medication Pain in the leg under control with current pain meds Would like a wheelchair especially for long travel. She is travelling to [place] next month and feels it would be easier for her to go long distances with a wheelchair. As present not using any assistive devices  Review of Systems:  Constitutional: Negative for fever, chills, weight loss, malaise/fatigue and diaphoresis.  HENT: Negative for headache, congestion, nasal discharge, hearing loss, earache, sore throat, difficulty swallowing.   Eyes: Negative for eye pain, blurred vision, double vision and discharge. has eye exam march 2016. Wears glasses Respiratory: Negative for cough, shortness of breath and wheezing.   Cardiovascular: Negative for chest pain, palpitations, leg swelling.  Gastrointestinal: Negative for heartburn, nausea, vomiting, abdominal pain, melena, rectal bleed, diarrhea and constipation. appetite is good Genitourinary: Negative for dysuria, urgency, frequency, hematuria, incontinence, nocturia and flank pain.  Musculoskeletal: Negative for back pain, falls, joint pain and myalgias.  Skin: Negative for itching, sores and rash.  Neurological: Negative for weakness,dizziness, tingling, focal  weakness Psychiatric/Behavioral: Negative for depression, anxiety, insomnia and memory loss.   Immunization History  Administered Date(s) Administered  . Influenza,inj,Quad PF,36+ Mos 10/08/2013, 09/30/2014  . Influenza-Unspecified 10/17/2012  . Pneumococcal Conjugate-13 10/31/1997, 07/29/2014  . Tdap 10/31/1997, 03/12/2014  . Zoster 07/30/2014    Past Medical History  Diagnosis Date  . Unspecified constipation   . Chronic kidney disease, stage III (moderate)   . Disorder of bone and cartilage, unspecified   . Other hammer toe (acquired)   . Anorexia   . Postmenopausal atrophic vaginitis   . Anemia, unspecified   . Reflux esophagitis   . Unspecified disorder of kidney and ureter   . Osteoarthrosis, unspecified whether generalized or localized, unspecified site   . Other and unspecified hyperlipidemia   . Cystocele, midline   . Unspecified essential hypertension   . Type II or unspecified type diabetes mellitus without mention of complication, not stated as uncontrolled   . Unspecified urinary incontinence   . Unspecified hereditary and idiopathic peripheral neuropathy   . Obesity, unspecified   . Kyphosis associated with other condition   . Chronic kidney disease    History reviewed. No pertinent past surgical history. Social History:   reports that she has quit smoking. She does not have any smokeless tobacco history on file. She reports that she does not drink alcohol or use illicit drugs.  Family History  Problem Relation Age of Onset  . Heart disease Mother   . Heart disease Sister   . Cancer Son   . Cancer Son     Medications: Patient's Medications  New Prescriptions  No medications on file  Previous Medications   ASPIRIN 81 MG TABLET    Take 81 mg by mouth daily.   CALCIUM-VITAMIN D (OSCAL WITH D) 500-200 MG-UNIT PER TABLET    Take 1 tablet by mouth daily.   CHOLECALCIFEROL (VITAMIN D) 1000 UNITS TABLET    Take 1,000 Units by mouth daily.   DESOXIMETASONE  (TOPICORT) 0.05 % CREAM    Apply topically 2 (two) times daily.   GABAPENTIN (NEURONTIN) 300 MG CAPSULE    Take one tablet by mouth in the morning and one tablet by mouth in the evening for pains   GLIPIZIDE (GLUCOTROL) 5 MG TABLET    TAKE 1 TABLET DAILY (AS OF 05/07/14)   LINAGLIPTIN (TRADJENTA) 5 MG TABS TABLET    Take 1 tablet (5 mg total) by mouth daily.   LISINOPRIL-HYDROCHLOROTHIAZIDE (PRINZIDE,ZESTORETIC) 20-12.5 MG PER TABLET    Take one tablet by mouth once daily to control blood pressure   LUBIPROSTONE (AMITIZA) 24 MCG CAPSULE    Take 1 capsule (24 mcg total) by mouth 2 (two) times daily with a meal.   OXYCODONE-ACETAMINOPHEN (ROXICET) 5-325 MG PER TABLET    Take 1 tablet by mouth every 8 (eight) hours as needed for severe pain.   SENNOSIDES-DOCUSATE SODIUM (SENOKOT-S) 8.6-50 MG TABLET    Take 1 tablet by mouth daily.   VITAMIN E 400 UNIT CAPSULE    Take 400 Units by mouth daily.   ZOSTER VACCINE LIVE, PF, (ZOSTAVAX) 02725 UNT/0.65ML INJECTION    Inject 19,400 Units into the skin once.  Modified Medications   No medications on file  Discontinued Medications   No medications on file     Physical Exam: Filed Vitals:   01/06/15 1533  BP: 122/68  Pulse: 90  Temp: 97.7 F (36.5 C)  TempSrc: Oral  Resp: 10  Height: 5\' 6"  (1.676 m)  Weight: 173 lb (78.472 kg)  SpO2: 99%    General- elderly female, well built, in no acute distress Head- normocephalic, atraumatic Ears- left ear normal tympanic membrane and normal external ear canal , right ear normal tympanic membrane and normal external ear canal Nose- normal nasal mucosa, no maxillary or frontal sinus tenderness, no nasal discharge Throat- moist mucus membrane, normal oropharynx, dentition is  Eyes- PERRLA, EOMI, no pallor, no icterus, no discharge, normal conjunctiva, normal sclera Neck- no cervical lymphadenopathy, no supraclavicular lymphadenopathy, no thyromegaly, no jugular vein distension, no carotid bruit Chest- no  chest wall deformities, no chest wall tenderness Breast- normal appearance, no masses or lumps on palpation, normal nipple and areola exam, no axillary lymphadenopathy Cardiovascular- normal s1,s2, no murmurs/ rubs/ gallops, good dorsalis pedis and radial pulses, no leg edema Respiratory- bilateral clear to auscultation, no wheeze, no rhonchi, no crackles, no use of accessory muscles Abdomen- bowel sounds present, soft, non tender, no organomegaly, no abdominal bruits, no guarding or rigidity, no CVA tenderness Pelvic exam- refused Musculoskeletal- able to move all 4 extremities, no spinal and paraspinal tenderness, normal back curvature, steady gait, no use of assistive device, normal range of motion  Neurological- no focal deficit, normal reflexes, normal muscle strength, normal sensation to fine touch and vibration Skin- warm and dry Psychiatry- alert and oriented to person, place and time, normal mood and affect    Labs reviewed:  CBC Latest Ref Rng 04/30/2014 09/30/2013 03/18/2013  WBC 3.4 - 10.8 x10E3/uL 6.1 7.5 7.3  Hemoglobin 11.1 - 15.9 g/dL 10.8(L) 10.7(L) 11.0(L)  Hematocrit 34.0 - 46.6 % 35.1 34.5 33.1(L)  CMP Latest Ref Rng 04/30/2014 01/03/2014 09/30/2013  Glucose 65 - 99 mg/dL 102(H) 120(H) 158(H)  BUN 8 - 27 mg/dL 26 20 27   Creatinine 0.57 - 1.00 mg/dL 1.38(H) 1.38(H) 1.50(H)  Sodium 134 - 144 mmol/L 144 140 141  Potassium 3.5 - 5.2 mmol/L 4.6 4.6 4.5  Chloride 97 - 108 mmol/L 106 100 97  CO2 18 - 29 mmol/L 24 22 22   Calcium 8.7 - 10.3 mg/dL 9.6 9.2 9.6  Total Protein 6.0 - 8.5 g/dL 6.8 - 6.4  Albumin 3.5 - 4.7 g/dL 4.1 - 3.9  Total Bilirubin 0.0 - 1.2 mg/dL 0.4 - 0.5  Alkaline Phos 39 - 117 IU/L 95 - 96  AST 0 - 40 IU/L 29 - 23  ALT 0 - 32 IU/L 20 - 16   Lipid Panel     Component Value Date/Time   TRIG 90 04/30/2014 1049   HDL 49 04/30/2014 1049   CHOLHDL 2.7 04/30/2014 1049   LDLCALC 66 04/30/2014 1049   Lab Results  Component Value Date   TSH 2.160  07/29/2014   Lab Results  Component Value Date   HGBA1C 6.9* 07/29/2014    EKG: 01-06-15 Independently reviewed. Sinus rhythm with PVCs noted, normal PR interval, normal QTc, RBBB, RVH with repolarization abnormality.   Assessment/Plan  1. Essential hypertension, benign Stable bp readings. Continue her lisinopril hctz current regimen. Pending lab work from this am - EKG 12-Lead  2. Therapeutic opioid induced constipation amitiza has been helpful, has flatulence, likely common side effect with amitiza. Advised on taking a cup of yoghurt everyday to see if this will help with her flatulence  3. Type 2 diabetes mellitus with diabetic nephropathy Pending a1c. Check urine microalbumin, continue glipizide and tradjenta. Continue aspirin and lisinopril. Check kidney function - Microalbumin/Creatinine Ratio, Urine  4. Spinal stenosis, lumbar region, with neurogenic claudication Pain controlled. Continue roxicet, senna s and amitiza. Script for wheelchair for as needed basis provided to help with long distance coverage given her age and leg problem  5. Routine general medical examination at a health care facility the patient was counseled regarding prevention of dental and periodontal disease, diet, regular sustained exercise for at least 30 minutes 5 times per week, the proper use of sunscreen and protective clothing, tobacco use, and recommended schedule for cholesterol, thyroid and diabetes screening.  6. RBBB (right bundle branch block) Has been there in prior ekg but now has PVCs. Will refer to cardiology to assess for need for holter in elderly patient with diabetes and HTN and new PVCs and possible echocardiogram to rule out structural abnormality - Ambulatory referral to Cardiology  7. PVC's (premature ventricular contractions) Asymptomatic, new findings, Will refer to cardiology to assess for need for holter in elderly patient with diabetes and HTN - Ambulatory referral to  Cardiology  8. Hyperlipidemia Pending lipid panel review  9. Osteopenia Continue oscal and vitamin d, fall precautions   Blanchie Serve, MD  [facility] [phone] (Monday-Friday 8 am - 5 pm) [phone] (afterhours)

## 2015-01-06 NOTE — Progress Notes (Signed)
Passed clock drawing 

## 2015-01-07 LAB — CBC WITH DIFFERENTIAL/PLATELET
Basophils Absolute: 0.1 10*3/uL (ref 0.0–0.2)
Basos: 1 %
EOS ABS: 0.3 10*3/uL (ref 0.0–0.4)
EOS: 3 %
HCT: 32.9 % — ABNORMAL LOW (ref 34.0–46.6)
Hemoglobin: 10.5 g/dL — ABNORMAL LOW (ref 11.1–15.9)
IMMATURE GRANULOCYTES: 0 %
Immature Grans (Abs): 0 10*3/uL (ref 0.0–0.1)
Lymphocytes Absolute: 2.1 10*3/uL (ref 0.7–3.1)
Lymphs: 22 %
MCH: 26.3 pg — ABNORMAL LOW (ref 26.6–33.0)
MCHC: 31.9 g/dL (ref 31.5–35.7)
MCV: 82 fL (ref 79–97)
MONOS ABS: 0.6 10*3/uL (ref 0.1–0.9)
Monocytes: 7 %
Neutrophils Absolute: 6.7 10*3/uL (ref 1.4–7.0)
Neutrophils Relative %: 67 %
PLATELETS: 294 10*3/uL (ref 150–379)
RBC: 4 x10E6/uL (ref 3.77–5.28)
RDW: 15.5 % — ABNORMAL HIGH (ref 12.3–15.4)
WBC: 9.8 10*3/uL (ref 3.4–10.8)

## 2015-01-07 LAB — COMPREHENSIVE METABOLIC PANEL
ALBUMIN: 3.3 g/dL — AB (ref 3.5–4.7)
ALT: 17 IU/L (ref 0–32)
AST: 26 IU/L (ref 0–40)
Albumin/Globulin Ratio: 0.9 — ABNORMAL LOW (ref 1.1–2.5)
Alkaline Phosphatase: 139 IU/L — ABNORMAL HIGH (ref 39–117)
BILIRUBIN TOTAL: 0.3 mg/dL (ref 0.0–1.2)
BUN / CREAT RATIO: 15 (ref 11–26)
BUN: 24 mg/dL (ref 8–27)
CALCIUM: 9.4 mg/dL (ref 8.7–10.3)
CHLORIDE: 104 mmol/L (ref 97–108)
CO2: 23 mmol/L (ref 18–29)
Creatinine, Ser: 1.63 mg/dL — ABNORMAL HIGH (ref 0.57–1.00)
GFR calc Af Amer: 33 mL/min/{1.73_m2} — ABNORMAL LOW (ref >59)
GFR, EST NON AFRICAN AMERICAN: 29 mL/min/{1.73_m2} — AB (ref >59)
Globulin, Total: 3.6 g/dL (ref 1.5–4.5)
Glucose: 118 mg/dL — ABNORMAL HIGH (ref 65–99)
Potassium: 4.5 mmol/L (ref 3.5–5.2)
SODIUM: 143 mmol/L (ref 134–144)
Total Protein: 6.9 g/dL (ref 6.0–8.5)

## 2015-01-07 LAB — LIPID PANEL
Chol/HDL Ratio: 4.2 ratio units (ref 0.0–4.4)
Cholesterol, Total: 171 mg/dL (ref 100–199)
HDL: 41 mg/dL (ref >39)
LDL CALC: 101 mg/dL — AB (ref 0–99)
TRIGLYCERIDES: 147 mg/dL (ref 0–149)
VLDL Cholesterol Cal: 29 mg/dL (ref 5–40)

## 2015-01-07 LAB — MICROALBUMIN / CREATININE URINE RATIO
CREATININE UR: 132.2 mg/dL (ref 15.0–278.0)
MICROALB/CREAT RATIO: 11 mg/g creat (ref 0.0–30.0)
Microalbumin, Urine: 14.6 ug/mL (ref 0.0–17.0)

## 2015-01-07 LAB — HEMOGLOBIN A1C
ESTIMATED AVERAGE GLUCOSE: 157 mg/dL
Hgb A1c MFr Bld: 7.1 % — ABNORMAL HIGH (ref 4.8–5.6)

## 2015-01-21 ENCOUNTER — Other Ambulatory Visit: Payer: Medicare Other

## 2015-01-21 DIAGNOSIS — E1121 Type 2 diabetes mellitus with diabetic nephropathy: Secondary | ICD-10-CM

## 2015-01-21 NOTE — Addendum Note (Signed)
Addended by: Jearld Adjutant on: 01/21/2015 11:57 AM   Modules accepted: Orders

## 2015-01-22 LAB — HEPATIC FUNCTION PANEL
ALT: 19 IU/L (ref 0–32)
AST: 22 IU/L (ref 0–40)
Albumin: 3.7 g/dL (ref 3.5–4.7)
Alkaline Phosphatase: 125 IU/L — ABNORMAL HIGH (ref 39–117)
Bilirubin Total: 0.3 mg/dL (ref 0.0–1.2)
Bilirubin, Direct: 0.07 mg/dL (ref 0.00–0.40)
TOTAL PROTEIN: 6.8 g/dL (ref 6.0–8.5)

## 2015-01-28 ENCOUNTER — Telehealth: Payer: Self-pay

## 2015-01-28 NOTE — Telephone Encounter (Signed)
Called patient to verify she requested DM supplies from [facility]. Patient did request DM testing supplies from this company, form completed and sent back to [phone].

## 2015-01-29 ENCOUNTER — Other Ambulatory Visit: Payer: Self-pay | Admitting: *Deleted

## 2015-01-29 ENCOUNTER — Encounter: Payer: Self-pay | Admitting: Cardiology

## 2015-01-29 DIAGNOSIS — I4510 Unspecified right bundle-branch block: Secondary | ICD-10-CM | POA: Insufficient documentation

## 2015-01-29 DIAGNOSIS — M48062 Spinal stenosis, lumbar region with neurogenic claudication: Secondary | ICD-10-CM

## 2015-01-29 DIAGNOSIS — I493 Ventricular premature depolarization: Secondary | ICD-10-CM

## 2015-01-29 HISTORY — DX: Unspecified right bundle-branch block: I45.10

## 2015-01-29 HISTORY — DX: Ventricular premature depolarization: I49.3

## 2015-01-29 MED ORDER — OXYCODONE-ACETAMINOPHEN 5-325 MG PO TABS: 1.0000 | ORAL_TABLET | Freq: Three times a day (TID) | ORAL | Status: DC | PRN

## 2015-01-29 NOTE — Progress Notes (Signed)
Cardiology Office Note   Date:  01/30/2015   ID:  Dawn [name], DOB 03/06/1928, MRN [id number]  PCP:  Blanchie Serve, MD  Cardiologist:   Sueanne Margarita, MD   Chief Complaint  Patient presents with  . rbbb  . PVC's      History of Present Illness: Dawn [name] is a 81 y.o. female who presents for evaluation of PVC's.  She has a history of chronic RBBB but recently was noted on EKG to have PVC's which are new.  She is now referred for consideration of Holter monitor and echo.  She denies any palpitations, chest pain or pressure, LE edema, dizziness or syncope.  She has some mild DOE that resolves when she sits down.  Her SOB dose not limit her and the leg pain is what limits her.  She is fairly sedentary due to chronic leg pain.    Past Medical History  Diagnosis Date  . Unspecified constipation   . Chronic kidney disease, stage III (moderate)   . Disorder of bone and cartilage, unspecified   . Other hammer toe (acquired)   . Anorexia   . Postmenopausal atrophic vaginitis   . Anemia, unspecified   . Reflux esophagitis   . Unspecified disorder of kidney and ureter   . Osteoarthrosis, unspecified whether generalized or localized, unspecified site   . Other and unspecified hyperlipidemia   . Cystocele, midline   . Unspecified essential hypertension   . Type II or unspecified type diabetes mellitus without mention of complication, not stated as uncontrolled   . Unspecified urinary incontinence   . Unspecified hereditary and idiopathic peripheral neuropathy   . Obesity, unspecified   . Kyphosis associated with other condition   . Chronic kidney disease   . PVC's (premature ventricular contractions) 01/29/2015  . RBBB 01/29/2015    No past surgical history on file.   Current Outpatient Prescriptions  Medication Sig Dispense Refill  . AMBULATORY NON FORMULARY MEDICATION wheelchair 1 each 0  . aspirin 81 MG tablet Take 81 mg by mouth daily.    . calcium-vitamin D (OSCAL  WITH D) 500-200 MG-UNIT per tablet Take 1 tablet by mouth daily.    . cholecalciferol (VITAMIN D) 1000 UNITS tablet Take 1,000 Units by mouth daily.    Marland Kitchen desoximetasone (TOPICORT) 0.05 % cream Apply topically 2 (two) times daily. 30 g 3  . gabapentin (NEURONTIN) 300 MG capsule Take one tablet by mouth in the morning and one tablet by mouth in the evening for pains 180 capsule 3  . glipiZIDE (GLUCOTROL) 5 MG tablet TAKE 1 TABLET DAILY (AS OF 05/07/14) 90 tablet 0  . linagliptin (TRADJENTA) 5 MG TABS tablet Take 1 tablet (5 mg total) by mouth daily. 90 tablet 3  . lisinopril-hydrochlorothiazide (PRINZIDE,ZESTORETIC) 20-12.5 MG per tablet Take one tablet by mouth once daily to control blood pressure 90 tablet 3  . lubiprostone (AMITIZA) 24 MCG capsule Take 1 capsule (24 mcg total) by mouth 2 (two) times daily with a meal. 180 capsule 3  . oxyCODONE-acetaminophen (ROXICET) 5-325 MG per tablet Take 1 tablet by mouth every 8 (eight) hours as needed for severe pain. 90 tablet 0  . sennosides-docusate sodium (SENOKOT-S) 8.6-50 MG tablet Take 1 tablet by mouth daily.    . vitamin E 400 UNIT capsule Take 400 Units by mouth daily.     No current facility-administered medications for this visit.    Allergies:   Keflex; Metronidazole; and Simvastatin    Social History:  The patient  reports that she has quit smoking. She does not have any smokeless tobacco history on file. She reports that she does not drink alcohol or use illicit drugs.   Family History:  The patient's family history includes Cancer in her son and son; Heart disease in her mother and sister.    ROS:  Please see the history of present illness.   Otherwise, review of systems are positive for none.   All other systems are reviewed and negative.    PHYSICAL EXAM: VS:  BP 116/62 mmHg  Pulse 90  Ht 5\' 6"  (1.676 m)  Wt 173 lb (78.472 kg)  BMI 27.94 kg/m2  SpO2 97% , BMI Body mass index is 27.94 kg/(m^2). GEN: Well nourished, well  developed, in no acute distress HEENT: normal Neck: no JVD, carotid bruits, or masses Cardiac: RRR; no murmurs, rubs, or gallops,no edema  Respiratory:  clear to auscultation bilaterally, normal work of breathing GI: soft, nontender, nondistended, + BS MS: no deformity or atrophy Skin: warm and dry, no rash Neuro:  Strength and sensation are intact Psych: euthymic mood, full affect   EKG:  EKG is not ordered today.    Recent Labs: 07/29/2014: TSH 2.160 01/06/2015: BUN 24; Creatinine 1.63*; Hemoglobin 10.5*; Platelets 294; Potassium 4.5; Sodium 143 01/21/2015: ALT 19    Lipid Panel    Component Value Date/Time   TRIG 147 01/06/2015 0951   HDL 41 01/06/2015 0951   CHOLHDL 4.2 01/06/2015 0951   LDLCALC 101* 01/06/2015 0951      Wt Readings from Last 3 Encounters:  01/30/15 173 lb (78.472 kg)  01/06/15 173 lb (78.472 kg)  07/29/14 181 lb 12.8 oz (82.464 kg)      Other studies Reviewed: Additional studies/ records that were reviewed today include: EKG. Review of the above records demonstrates: NSR with PVC's and RBBB   ASSESSMENT AND PLAN:  1.  PVC's - asymptomatic.  I will get a 2D echo to assess LVF.  If normal then no further cardiac workup. 2.  Chronic RBBB - noted on EKG 2014.  She has no chest pain.   3.  HTN - controlled 4.  Chronic kidney disease 5.  Type II DM 6.  Mild DOE that does not limit her and she is fairly sedentary.    Current medicines are reviewed at length with the patient today.  The patient does not have concerns regarding medicines.  The following changes have been made:  no change  Labs/ tests ordered today include: 2D echo    Disposition:   FU with me PRN pending results of echo    Lurena Nida, MD  01/30/2015 9:33 AM    [address], [place], [place]  [address] Phone: [phone]; Fax: [phone]

## 2015-01-29 NOTE — Telephone Encounter (Signed)
Patient Requested and will pick up 

## 2015-01-30 ENCOUNTER — Encounter: Payer: Self-pay | Admitting: Cardiology

## 2015-01-30 ENCOUNTER — Ambulatory Visit (INDEPENDENT_AMBULATORY_CARE_PROVIDER_SITE_OTHER): Payer: Medicare Other | Admitting: Cardiology

## 2015-01-30 VITALS — BP 116/62 | HR 90 | Ht 66.0 in | Wt 173.0 lb

## 2015-01-30 DIAGNOSIS — I10 Essential (primary) hypertension: Secondary | ICD-10-CM

## 2015-01-30 DIAGNOSIS — E0821 Diabetes mellitus due to underlying condition with diabetic nephropathy: Secondary | ICD-10-CM

## 2015-01-30 DIAGNOSIS — I4510 Unspecified right bundle-branch block: Secondary | ICD-10-CM

## 2015-01-30 DIAGNOSIS — I493 Ventricular premature depolarization: Secondary | ICD-10-CM

## 2015-01-30 NOTE — Patient Instructions (Signed)
Your physician recommends that you continue on your current medications as directed. Please refer to the Current Medication list given to you today.  Your physician has requested that you have an echocardiogram. Echocardiography is a painless test that uses sound waves to create images of your heart. It provides your doctor with information about the size and shape of your heart and how well your heart's chambers and valves are working. This procedure takes approximately one hour. There are no restrictions for this procedure.  Your physician recommends that you schedule a follow-up appointment AS NEEDED with Dr. Radford Pax pending your ECHO results.

## 2015-02-03 ENCOUNTER — Ambulatory Visit (HOSPITAL_COMMUNITY): Payer: Medicare Other | Attending: Cardiology | Admitting: Cardiology

## 2015-02-03 DIAGNOSIS — I493 Ventricular premature depolarization: Secondary | ICD-10-CM | POA: Diagnosis present

## 2015-02-03 DIAGNOSIS — I4510 Unspecified right bundle-branch block: Secondary | ICD-10-CM | POA: Diagnosis not present

## 2015-02-03 NOTE — Progress Notes (Signed)
Echo performed. 

## 2015-02-04 ENCOUNTER — Telehealth: Payer: Self-pay

## 2015-02-04 DIAGNOSIS — I5189 Other ill-defined heart diseases: Secondary | ICD-10-CM

## 2015-02-04 NOTE — Telephone Encounter (Signed)
-----   Message from Sueanne Margarita, MD sent at 02/03/2015  4:37 PM EST ----- She has evidence of diastolic dysfunction so please have her come in for a BNP for SOB

## 2015-02-04 NOTE — Telephone Encounter (Signed)
Patient informed of results and verbal understanding expressed.  BNP scheduled for next Tuesday per patient request.

## 2015-02-10 ENCOUNTER — Other Ambulatory Visit: Payer: Self-pay

## 2015-02-10 ENCOUNTER — Other Ambulatory Visit (INDEPENDENT_AMBULATORY_CARE_PROVIDER_SITE_OTHER): Payer: Medicare Other | Admitting: *Deleted

## 2015-02-10 DIAGNOSIS — I519 Heart disease, unspecified: Secondary | ICD-10-CM

## 2015-02-10 DIAGNOSIS — I5189 Other ill-defined heart diseases: Secondary | ICD-10-CM

## 2015-02-10 LAB — BRAIN NATRIURETIC PEPTIDE: Pro B Natriuretic peptide (BNP): 49 pg/mL (ref 0.0–100.0)

## 2015-02-11 ENCOUNTER — Telehealth: Payer: Self-pay | Admitting: *Deleted

## 2015-02-11 NOTE — Telephone Encounter (Signed)
-----   Message from Sueanne Margarita, MD sent at 02/10/2015  3:54 PM EST ----- Please let patient know that labs were normal.  Continue current medical therapy.

## 2015-02-11 NOTE — Telephone Encounter (Signed)
Pt. Is aware of normal lab results, and will follow-up prn. Stated verbal understanding

## 2015-02-19 ENCOUNTER — Ambulatory Visit: Payer: Medicare Other | Admitting: Podiatrist

## 2015-02-27 ENCOUNTER — Ambulatory Visit (INDEPENDENT_AMBULATORY_CARE_PROVIDER_SITE_OTHER): Payer: Medicare Other | Admitting: Podiatrist

## 2015-02-27 ENCOUNTER — Encounter: Payer: Self-pay | Admitting: Podiatrist

## 2015-02-27 DIAGNOSIS — E1142 Type 2 diabetes mellitus with diabetic polyneuropathy: Secondary | ICD-10-CM

## 2015-02-27 DIAGNOSIS — M79609 Pain in unspecified limb: Secondary | ICD-10-CM

## 2015-02-27 DIAGNOSIS — B351 Tinea unguium: Secondary | ICD-10-CM | POA: Diagnosis not present

## 2015-02-27 DIAGNOSIS — G629 Polyneuropathy, unspecified: Secondary | ICD-10-CM

## 2015-02-27 NOTE — Progress Notes (Signed)
HPI: Patient presents today for follow up of diabetic foot and nail care and for dispensing of her diabetic shoes.. Past medical history, meds, and allergies reviewed. Patient states blood sugar is under good control. Patient states the pain she has been experiencing on the right great toenail has gone away.  Objective: Neurovascular status unchanged with palpable pedal pulses at 1 out of 4 dp and pt  neurological exam reveals protective sensation to be intact. absent pre-ulcerative lesions. Toenails are elongated, incurvated, discolored, dystrophic with ingrown deformity present. Patient relates pain at Left 2nd and 3rd toe due to rub on shoes. No redness or swelling, no sign of infection present. Patient's toes are contracted and elongated.  Assessment: Diabetes , Ingrown nail deformity,  Plan: Discussed treatment options and alternatives. Debrided nails without complication.

## 2015-02-28 ENCOUNTER — Other Ambulatory Visit: Payer: Self-pay | Admitting: Internal Medicine

## 2015-03-11 ENCOUNTER — Other Ambulatory Visit: Payer: Self-pay

## 2015-03-11 DIAGNOSIS — M48062 Spinal stenosis, lumbar region with neurogenic claudication: Secondary | ICD-10-CM

## 2015-03-11 MED ORDER — OXYCODONE-ACETAMINOPHEN 5-325 MG PO TABS: 1.0000 | ORAL_TABLET | Freq: Three times a day (TID) | ORAL | Status: DC | PRN

## 2015-03-19 LAB — HM DIABETES EYE EXAM

## 2015-03-31 ENCOUNTER — Encounter: Payer: Self-pay | Admitting: *Deleted

## 2015-04-17 ENCOUNTER — Encounter: Payer: Self-pay | Admitting: Internal Medicine

## 2015-04-17 ENCOUNTER — Ambulatory Visit (INDEPENDENT_AMBULATORY_CARE_PROVIDER_SITE_OTHER): Payer: Medicare Other | Admitting: Internal Medicine

## 2015-04-17 VITALS — BP 118/76 | HR 80 | Temp 97.8°F | Ht 66.0 in | Wt 177.0 lb

## 2015-04-17 DIAGNOSIS — E1121 Type 2 diabetes mellitus with diabetic nephropathy: Secondary | ICD-10-CM | POA: Diagnosis not present

## 2015-04-17 DIAGNOSIS — N183 Chronic kidney disease, stage 3 unspecified: Secondary | ICD-10-CM

## 2015-04-17 DIAGNOSIS — E785 Hyperlipidemia, unspecified: Secondary | ICD-10-CM | POA: Diagnosis not present

## 2015-04-17 DIAGNOSIS — I10 Essential (primary) hypertension: Secondary | ICD-10-CM | POA: Diagnosis not present

## 2015-04-17 DIAGNOSIS — M4806 Spinal stenosis, lumbar region: Secondary | ICD-10-CM

## 2015-04-17 DIAGNOSIS — K5909 Other constipation: Secondary | ICD-10-CM

## 2015-04-17 DIAGNOSIS — M48062 Spinal stenosis, lumbar region with neurogenic claudication: Secondary | ICD-10-CM

## 2015-04-17 DIAGNOSIS — K5903 Drug induced constipation: Secondary | ICD-10-CM

## 2015-04-17 DIAGNOSIS — T402X5A Adverse effect of other opioids, initial encounter: Secondary | ICD-10-CM

## 2015-04-17 MED ORDER — LUBIPROSTONE 24 MCG PO CAPS: 24.0000 ug | ORAL_CAPSULE | Freq: Every day | ORAL | Status: DC

## 2015-04-17 NOTE — Progress Notes (Signed)
Patient ID: Dawn [name], female   DOB: 12/17/1927, 81 y.o.   MRN: [id number]   Location:  [facility]  Goals of Care:  Advanced Directive information Does patient have an advance directive?: Yes, Would patient like information on creating an advanced directive?: Yes - Educational materials given, Type of Advance Directive: Living will, Does patient want to make changes to advanced directive?: Yes - information given (she is going back to her attorney about this)   Allergies  Allergen Reactions  . Keflex [Cephalexin]   . Metronidazole   . Simvastatin     Leg cramsps    Chief Complaint  Patient presents with  . Medical Management of Chronic Issues    3 month follow-up, labs completed 12/2014. Patient with ongoing leg pain. Discuss amitiza- causing loose stools at times    HPI: Patient is a 81 y.o. female seen in the office today for medical mgt of chronic diseases.    Lives with grandson and son.   The loose stool is coming with the amitiza sometimes.  Holds next pill if she does get it.  Happening more often than she'd like--inconvenient.    Still has pains in her leg--cannot stand or walk long.  Left leg in calf and may go into her thigh.  Was concerned about percocet after the stories about Prince's death.  Usually just twice a day.   Checks her glucose once a day--doing pretty well.  If goes out of town, it might be off a little.  No recent difficulty with hypoglycemia.  Has a good appetite.  No recent problem in her back itself.  Back felt to be cause of her left leg pain.     Had been going to gyn.  Went to [facility] gyn, Dr. Zigmund Daniel, from there and had surgery.  Was a bladder tucking procedure.  Eats small amts at her meals and snacks like fruit in between.    Has h/o PVCs and RBBB.     Review of Systems:  Review of Systems  Constitutional: Negative for fever and chills.       Is cold natured  HENT: Positive for hearing loss.         Had hearing testing before, but was told she did not need hearing aides  Eyes:       Vision unchanged, has cataracts, but has not had surgery yet, f/u in 1 year from last month  Respiratory: Positive for shortness of breath.        With overexertion  Cardiovascular: Negative for chest pain and palpitations.  Gastrointestinal: Positive for constipation. Negative for blood in stool and melena.  Genitourinary: Negative for dysuria, urgency and frequency.  Musculoskeletal: Positive for myalgias. Negative for back pain and falls.       Left leg  Skin: Negative for rash.       Has been to dermatology and uses cream for breakouts--Dr. Lupton--2 year ago  Neurological: Negative for dizziness and loss of consciousness.       Syncopal episode with low glucose over a year ago  Endo/Heme/Allergies: Negative for environmental allergies.  Psychiatric/Behavioral: Negative for memory loss.       Admits to a little short term memory loss     Past Medical History  Diagnosis Date  . Unspecified constipation   . Chronic kidney disease, stage III (moderate)   . Disorder of bone and cartilage, unspecified   . Other hammer toe (acquired)   .  Anorexia   . Postmenopausal atrophic vaginitis   . Anemia, unspecified   . Reflux esophagitis   . Unspecified disorder of kidney and ureter   . Osteoarthrosis, unspecified whether generalized or localized, unspecified site   . Other and unspecified hyperlipidemia   . Cystocele, midline   . Unspecified essential hypertension   . Type II or unspecified type diabetes mellitus without mention of complication, not stated as uncontrolled   . Unspecified urinary incontinence   . Unspecified hereditary and idiopathic peripheral neuropathy   . Obesity, unspecified   . Kyphosis associated with other condition   . Chronic kidney disease   . PVC's (premature ventricular contractions) 01/29/2015  . RBBB 01/29/2015    History reviewed. No pertinent past surgical  history.  Social History:   reports that she has quit smoking. She does not have any smokeless tobacco history on file. She reports that she does not drink alcohol or use illicit drugs.  Family History  Problem Relation Age of Onset  . Heart disease Mother   . Heart disease Sister   . Cancer Son   . Cancer Son     Medications: Patient's Medications  New Prescriptions   No medications on file  Previous Medications   AMBULATORY NON FORMULARY MEDICATION    wheelchair   ASPIRIN 81 MG TABLET    Take 81 mg by mouth daily.   CALCIUM-VITAMIN D (OSCAL WITH D) 500-200 MG-UNIT PER TABLET    Take 1 tablet by mouth daily.   CHOLECALCIFEROL (VITAMIN D) 1000 UNITS TABLET    Take 1,000 Units by mouth daily.   DESOXIMETASONE (TOPICORT) 0.05 % CREAM    Apply topically 2 (two) times daily.   GABAPENTIN (NEURONTIN) 300 MG CAPSULE    Take one tablet by mouth in the morning and one tablet by mouth in the evening for pains   GLIPIZIDE (GLUCOTROL) 5 MG TABLET    TAKE 1 TABLET DAILY (AS OF 05/07/14)   LINAGLIPTIN (TRADJENTA) 5 MG TABS TABLET    Take 1 tablet (5 mg total) by mouth daily.   LISINOPRIL-HYDROCHLOROTHIAZIDE (PRINZIDE,ZESTORETIC) 20-12.5 MG PER TABLET    Take one tablet by mouth once daily to control blood pressure   LUBIPROSTONE (AMITIZA) 24 MCG CAPSULE    Take 1 capsule (24 mcg total) by mouth 2 (two) times daily with a meal.   OXYCODONE-ACETAMINOPHEN (ROXICET) 5-325 MG PER TABLET    Take 1 tablet by mouth every 8 (eight) hours as needed for severe pain.   SENNOSIDES-DOCUSATE SODIUM (SENOKOT-S) 8.6-50 MG TABLET    Take 1 tablet by mouth daily.   VITAMIN E 400 UNIT CAPSULE    Take 400 Units by mouth daily.  Modified Medications   No medications on file  Discontinued Medications   OXYCODONE-ACETAMINOPHEN (ROXICET) 5-325 MG PER TABLET    Take 1 tablet by mouth every 8 (eight) hours as needed for severe pain.   Physical Exam: Filed Vitals:   04/17/15 0946  BP: 118/76  Pulse: 80  Temp: 97.8 F  (36.6 C)  TempSrc: Oral  Height: 5\' 6"  (1.676 m)  Weight: 177 lb (80.287 kg)  SpO2: 97%  Physical Exam  Constitutional: She is oriented to person, place, and time. She appears well-developed and well-nourished. No distress.  Cardiovascular:  Regular with occasional PVCs  Pulmonary/Chest: Effort normal and breath sounds normal. No respiratory distress.  Abdominal: Bowel sounds are normal.  Musculoskeletal: Normal range of motion. She exhibits no tenderness.  In her left leg (pain internal)  Neurological: She is alert and oriented to person, place, and time.  Skin: Skin is warm and dry.  Psychiatric: She has a normal mood and affect.    Labs reviewed: Basic Metabolic Panel:  Recent Labs  04/30/14 1049 07/29/14 1450 01/06/15 0951  NA 144  --  143  K 4.6  --  4.5  CL 106  --  104  CO2 24  --  23  GLUCOSE 102*  --  118*  BUN 26  --  24  CREATININE 1.38*  --  1.63*  CALCIUM 9.6  --  9.4  TSH  --  2.160  --    Liver Function Tests:  Recent Labs  04/30/14 1049 01/06/15 0951 01/21/15 1157  AST 29 26 22   ALT 20 17 19   ALKPHOS 95 139* 125*  BILITOT 0.4 0.3 0.3  PROT 6.8 6.9 6.8  CBC:  Recent Labs  04/30/14 1049 01/06/15 0951  WBC 6.1 9.8  NEUTROABS 4.3 6.7  HGB 10.8* 10.5*  HCT 35.1 32.9*  MCV 85 82  PLT  --  294   Lipid Panel:  Recent Labs  04/30/14 1049 01/06/15 0951  CHOL 133 171  HDL 49 41  LDLCALC 66 101*  TRIG 90 147  CHOLHDL 2.7 4.2   Lab Results  Component Value Date   HGBA1C 7.1* 01/06/2015    Assessment/Plan 1. Type 2 diabetes mellitus with diabetic nephropathy - has been under good control for her age with oral agents, checks daily - sees podiatrist and ophthalmologist -has CKD III, is on ace - CBC with Differential/Platelet - Hemoglobin A1c  2. Spinal stenosis, lumbar region, with neurogenic claudication -felt to be cause of left leg pain  3. Therapeutic opioid induced constipation -cont amitiza but cut back to daily from  bid  4. Essential hypertension, benign -bp at goal with current regimen, no changes, is on ace  5. Hyperlipidemia - not on mediations - Lipid panel  6. Chronic kidney disease, stage 3 (moderate) -cont ace, monitor, avoid nsaids - CBC with Differential/Platelet - Comprehensive metabolic panel   Labs/tests ordered: before her next appt Orders Placed This Encounter  Procedures  . CBC with Differential/Platelet  . Comprehensive metabolic panel    Order Specific Question:  Has the patient fasted?    Answer:  Yes  . Hemoglobin A1c  . Lipid panel    Order Specific Question:  Has the patient fasted?    Answer:  Yes    Next appt:  3 months  Dawn [name] L. Aleesa Sweigert, D.O. [facility] [address], [place] [address] Cell Phone (Mon-Fri 8am-5pm):  [phone] On Call:  [phone] & follow prompts after 5pm & weekends Office Phone:  [phone] Office Fax:  [phone]

## 2015-04-21 ENCOUNTER — Other Ambulatory Visit: Payer: Self-pay | Admitting: *Deleted

## 2015-04-21 DIAGNOSIS — M48062 Spinal stenosis, lumbar region with neurogenic claudication: Secondary | ICD-10-CM

## 2015-04-21 MED ORDER — OXYCODONE-ACETAMINOPHEN 5-325 MG PO TABS: 1.0000 | ORAL_TABLET | Freq: Three times a day (TID) | ORAL | Status: DC | PRN

## 2015-04-21 NOTE — Telephone Encounter (Signed)
Patient requested and will pick up 

## 2015-06-03 ENCOUNTER — Ambulatory Visit (INDEPENDENT_AMBULATORY_CARE_PROVIDER_SITE_OTHER): Payer: Medicare Other | Admitting: Podiatry

## 2015-06-03 DIAGNOSIS — B351 Tinea unguium: Secondary | ICD-10-CM | POA: Diagnosis not present

## 2015-06-03 DIAGNOSIS — M79609 Pain in unspecified limb: Secondary | ICD-10-CM

## 2015-06-03 NOTE — Patient Instructions (Signed)
Diabetes and Foot Care Diabetes may cause you to have problems because of poor blood supply (circulation) to your feet and legs. This may cause the skin on your feet to become thinner, break easier, and heal more slowly. Your skin may become dry, and the skin may peel and crack. You may also have nerve damage in your legs and feet causing decreased feeling in them. You may not notice minor injuries to your feet that could lead to infections or more serious problems. Taking care of your feet is one of the most important things you can do for yourself.  HOME CARE INSTRUCTIONS  Wear shoes at all times, even in the house. Do not go barefoot. Bare feet are easily injured.  Check your feet daily for blisters, cuts, and redness. If you cannot see the bottom of your feet, use a mirror or ask someone for help.  Wash your feet with warm water (do not use hot water) and mild soap. Then pat your feet and the areas between your toes until they are completely dry. Do not soak your feet as this can dry your skin.  Apply a moisturizing lotion or petroleum jelly (that does not contain alcohol and is unscented) to the skin on your feet and to dry, brittle toenails. Do not apply lotion between your toes.  Trim your toenails straight across. Do not dig under them or around the cuticle. File the edges of your nails with an emery board or nail file.  Do not cut corns or calluses or try to remove them with medicine.  Wear clean socks or stockings every day. Make sure they are not too tight. Do not wear knee-high stockings since they may decrease blood flow to your legs.  Wear shoes that fit properly and have enough cushioning. To break in new shoes, wear them for just a few hours a day. This prevents you from injuring your feet. Always look in your shoes before you put them on to be sure there are no objects inside.  Do not cross your legs. This may decrease the blood flow to your feet.  If you find a minor scrape,  cut, or break in the skin on your feet, keep it and the skin around it clean and dry. These areas may be cleansed with mild soap and water. Do not cleanse the area with peroxide, alcohol, or iodine.  When you remove an adhesive bandage, be sure not to damage the skin around it.  If you have a wound, look at it several times a day to make sure it is healing.  Do not use heating pads or hot water bottles. They may burn your skin. If you have lost feeling in your feet or legs, you may not know it is happening until it is too late.  Make sure your health care provider performs a complete foot exam at least annually or more often if you have foot problems. Report any cuts, sores, or bruises to your health care provider immediately. SEEK MEDICAL CARE IF:   You have an injury that is not healing.  You have cuts or breaks in the skin.  You have an ingrown nail.  You notice redness on your legs or feet.  You feel burning or tingling in your legs or feet.  You have pain or cramps in your legs and feet.  Your legs or feet are numb.  Your feet always feel cold. SEEK IMMEDIATE MEDICAL CARE IF:   There is increasing redness,   swelling, or pain in or around a wound.  There is a red line that goes up your leg.  Pus is coming from a wound.  You develop a fever or as directed by your health care provider.  You notice a bad smell coming from an ulcer or wound. Document Released: 11/25/2000 Document Revised: 07/31/2013 Document Reviewed: 05/07/2013 Exit[employer] Patient Information 2015 [employer]. This information is not intended to replace advice given to you by your health care provider. Make sure you discuss any questions you have with your health care provider.  

## 2015-06-04 NOTE — Progress Notes (Signed)
Patient ID: Dawn [name], female   DOB: [birth year], 81 y.o.   MRN: [id number]   Subjective: This patient presents for scheduled visit complaining of painful toenails her request nail debridement  Objective: HAV and hammertoe 2-5 bilaterally The toenails are hypertrophic, elongated, incurvated, discolored and tender direct palpation No open skin lesions noted bilaterally  Assessment: Symptomatic onychomycoses 6-10 Type II diabetic  Plan: Debridement of toenails 10 without any bleeding  Reappoint 3 months

## 2015-06-09 ENCOUNTER — Other Ambulatory Visit: Payer: Self-pay | Admitting: *Deleted

## 2015-06-09 DIAGNOSIS — M48062 Spinal stenosis, lumbar region with neurogenic claudication: Secondary | ICD-10-CM

## 2015-06-09 MED ORDER — GABAPENTIN 300 MG PO CAPS: ORAL_CAPSULE | ORAL | Status: DC

## 2015-06-09 MED ORDER — OXYCODONE-ACETAMINOPHEN 5-325 MG PO TABS: 1.0000 | ORAL_TABLET | Freq: Three times a day (TID) | ORAL | Status: DC | PRN

## 2015-06-09 NOTE — Telephone Encounter (Signed)
Patient requested and Rx faxed to [facility] for the Gabapentin and the Oxy will be picked up by patient tomorrow.

## 2015-07-02 ENCOUNTER — Encounter: Payer: Self-pay | Admitting: Internal Medicine

## 2015-07-20 ENCOUNTER — Other Ambulatory Visit: Payer: Medicare Other

## 2015-07-20 DIAGNOSIS — E785 Hyperlipidemia, unspecified: Secondary | ICD-10-CM

## 2015-07-20 DIAGNOSIS — N183 Chronic kidney disease, stage 3 unspecified: Secondary | ICD-10-CM

## 2015-07-20 DIAGNOSIS — E1121 Type 2 diabetes mellitus with diabetic nephropathy: Secondary | ICD-10-CM

## 2015-07-20 DIAGNOSIS — I10 Essential (primary) hypertension: Secondary | ICD-10-CM

## 2015-07-21 ENCOUNTER — Other Ambulatory Visit: Payer: Self-pay

## 2015-07-21 LAB — COMPREHENSIVE METABOLIC PANEL
ALT: 14 IU/L (ref 0–32)
AST: 20 IU/L (ref 0–40)
Albumin/Globulin Ratio: 1.4 (ref 1.1–2.5)
Albumin: 3.8 g/dL (ref 3.5–4.7)
Alkaline Phosphatase: 102 IU/L (ref 39–117)
BUN/Creatinine Ratio: 17 (ref 11–26)
BUN: 27 mg/dL (ref 8–27)
Bilirubin Total: 0.3 mg/dL (ref 0.0–1.2)
CO2: 24 mmol/L (ref 18–29)
Calcium: 9.2 mg/dL (ref 8.7–10.3)
Chloride: 104 mmol/L (ref 97–108)
Creatinine, Ser: 1.56 mg/dL — ABNORMAL HIGH (ref 0.57–1.00)
GFR calc Af Amer: 35 mL/min/{1.73_m2} — ABNORMAL LOW (ref >59)
GFR calc non Af Amer: 30 mL/min/{1.73_m2} — ABNORMAL LOW (ref >59)
Globulin, Total: 2.8 g/dL (ref 1.5–4.5)
Glucose: 138 mg/dL — ABNORMAL HIGH (ref 65–99)
Potassium: 4.5 mmol/L (ref 3.5–5.2)
Sodium: 143 mmol/L (ref 134–144)
Total Protein: 6.6 g/dL (ref 6.0–8.5)

## 2015-07-21 LAB — CBC WITH DIFFERENTIAL/PLATELET
Basophils Absolute: 0 10*3/uL (ref 0.0–0.2)
Basos: 1 %
EOS (ABSOLUTE): 0.3 10*3/uL (ref 0.0–0.4)
Eos: 5 %
Hematocrit: 34.6 % (ref 34.0–46.6)
Hemoglobin: 10.6 g/dL — ABNORMAL LOW (ref 11.1–15.9)
Immature Grans (Abs): 0 10*3/uL (ref 0.0–0.1)
Immature Granulocytes: 0 %
Lymphocytes Absolute: 1.9 10*3/uL (ref 0.7–3.1)
Lymphs: 27 %
MCH: 26.4 pg — ABNORMAL LOW (ref 26.6–33.0)
MCHC: 30.6 g/dL — ABNORMAL LOW (ref 31.5–35.7)
MCV: 86 fL (ref 79–97)
Monocytes Absolute: 0.5 10*3/uL (ref 0.1–0.9)
Monocytes: 7 %
Neutrophils Absolute: 4.2 10*3/uL (ref 1.4–7.0)
Neutrophils: 60 %
Platelets: 202 10*3/uL (ref 150–379)
RBC: 4.01 x10E6/uL (ref 3.77–5.28)
RDW: 16.5 % — ABNORMAL HIGH (ref 12.3–15.4)
WBC: 7 10*3/uL (ref 3.4–10.8)

## 2015-07-21 LAB — LIPID PANEL
Chol/HDL Ratio: 4.2 ratio units (ref 0.0–4.4)
Cholesterol, Total: 191 mg/dL (ref 100–199)
HDL: 45 mg/dL (ref >39)
LDL Calculated: 121 mg/dL — ABNORMAL HIGH (ref 0–99)
Triglycerides: 123 mg/dL (ref 0–149)
VLDL Cholesterol Cal: 25 mg/dL (ref 5–40)

## 2015-07-21 LAB — HEMOGLOBIN A1C
Est. average glucose Bld gHb Est-mCnc: 146 mg/dL
Hgb A1c MFr Bld: 6.7 % — ABNORMAL HIGH (ref 4.8–5.6)

## 2015-07-21 MED ORDER — PRAVASTATIN SODIUM 20 MG PO TABS: ORAL_TABLET | ORAL | Status: DC

## 2015-07-24 ENCOUNTER — Ambulatory Visit: Payer: Medicare Other | Admitting: Internal Medicine

## 2015-07-30 ENCOUNTER — Encounter: Payer: Self-pay | Admitting: Internal Medicine

## 2015-07-30 ENCOUNTER — Ambulatory Visit (INDEPENDENT_AMBULATORY_CARE_PROVIDER_SITE_OTHER): Payer: Medicare Other | Admitting: Internal Medicine

## 2015-07-30 VITALS — BP 110/76 | HR 78 | Temp 97.6°F | Resp 20 | Ht 66.0 in | Wt 175.2 lb

## 2015-07-30 DIAGNOSIS — T402X5A Adverse effect of other opioids, initial encounter: Secondary | ICD-10-CM | POA: Diagnosis not present

## 2015-07-30 DIAGNOSIS — E785 Hyperlipidemia, unspecified: Secondary | ICD-10-CM | POA: Diagnosis not present

## 2015-07-30 DIAGNOSIS — M48062 Spinal stenosis, lumbar region with neurogenic claudication: Secondary | ICD-10-CM

## 2015-07-30 DIAGNOSIS — K5903 Drug induced constipation: Secondary | ICD-10-CM

## 2015-07-30 DIAGNOSIS — Z23 Encounter for immunization: Secondary | ICD-10-CM | POA: Diagnosis not present

## 2015-07-30 DIAGNOSIS — E1121 Type 2 diabetes mellitus with diabetic nephropathy: Secondary | ICD-10-CM

## 2015-07-30 DIAGNOSIS — N183 Chronic kidney disease, stage 3 unspecified: Secondary | ICD-10-CM

## 2015-07-30 DIAGNOSIS — K5909 Other constipation: Secondary | ICD-10-CM | POA: Diagnosis not present

## 2015-07-30 DIAGNOSIS — M4806 Spinal stenosis, lumbar region: Secondary | ICD-10-CM | POA: Diagnosis not present

## 2015-07-30 DIAGNOSIS — M899 Disorder of bone, unspecified: Secondary | ICD-10-CM

## 2015-07-30 DIAGNOSIS — M8580 Other specified disorders of bone density and structure, unspecified site: Secondary | ICD-10-CM

## 2015-07-30 DIAGNOSIS — I10 Essential (primary) hypertension: Secondary | ICD-10-CM

## 2015-07-30 MED ORDER — LINAGLIPTIN 5 MG PO TABS: 5.0000 mg | ORAL_TABLET | Freq: Every day | ORAL | Status: DC

## 2015-07-30 MED ORDER — GLIPIZIDE 5 MG PO TABS: ORAL_TABLET | ORAL | Status: DC

## 2015-07-30 MED ORDER — DESOXIMETASONE 0.05 % EX CREA: TOPICAL_CREAM | Freq: Two times a day (BID) | CUTANEOUS | Status: DC

## 2015-07-30 MED ORDER — OXYCODONE-ACETAMINOPHEN 5-325 MG PO TABS: 1.0000 | ORAL_TABLET | Freq: Three times a day (TID) | ORAL | Status: DC | PRN

## 2015-07-30 NOTE — Patient Instructions (Signed)
Please bring [place] a copy of your living will and health care power of attorney for your record.

## 2015-07-30 NOTE — Progress Notes (Signed)
Patient ID: Dawn [name], female   DOB: [birth year], 81 y.o.   MRN: [id number]   Location:  [facility] / [facility]  Code Status: DNR Goals of Care: Advanced Directive information Does patient have an advance directive?: Yes, Type of Advance Directive: [place];Living will, Does patient want to make changes to advanced directive?: No - Patient declined   Chief Complaint  Patient presents with  . Medical Management of Chronic Issues    3 month follow-up, Patient c/o says she is still having pain in her left leg  . Immunizations    Discuss Pneumococccal 23    HPI: Patient is a 81 y.o. black female seen in the office today for med mgt of chronic diseases.  Hyperlipidemia:  Was uncontrolled on labs so started her on lipitor.  Has not affected leg cramps.  Has had persistent leg cramps unrelated to the statins.  Takes two percocet per day.  Cramps do ease off some with it.  Come and go, wakes up with it sometimes.  Does not get localized back pains though felt to cause her leg pains. Labs have not explained her symptoms.  Doesn't think she drinks enough.  Says she should drink that much with all of the pills, but she says she'll measure if that could be a contributor to cramps.    Glucose has been doing ok.  High for her this am at 137.  Average improved from 7.1 to 6.7.  Had a 67 one time and 85 the other time.  Otherwise around 99-100.  Ate her breakfast to treat the low. Is trying to eat properly.  Says she should get more exercise, but does do some walking.     Has a cane, but doesn't use it.    Agrees to bone density to f/u on osteopenia--continues on calcium and vitamin D.  Tries to do walking.  Last one was at [facility] 06/20/13.  Also due for pneumovax 23.    Review of Systems:  Review of Systems  Constitutional: Negative for fever and chills.  HENT: Negative for congestion and hearing loss.   Eyes: Negative for blurred vision.    Respiratory: Negative for shortness of breath.   Cardiovascular: Negative for chest pain and leg swelling.  Gastrointestinal: Negative for abdominal pain.  Genitourinary: Negative for dysuria.  Musculoskeletal: Positive for myalgias and joint pain. Negative for falls.       Leg cramps  Neurological: Negative for dizziness, loss of consciousness and headaches.  Endo/Heme/Allergies:       Diabetes  Psychiatric/Behavioral: Negative for depression and memory loss.    Past Medical History  Diagnosis Date  . Unspecified constipation   . Chronic kidney disease, stage III (moderate)   . Disorder of bone and cartilage, unspecified   . Other hammer toe (acquired)   . Anorexia   . Postmenopausal atrophic vaginitis   . Anemia, unspecified   . Reflux esophagitis   . Unspecified disorder of kidney and ureter   . Osteoarthrosis, unspecified whether generalized or localized, unspecified site   . Other and unspecified hyperlipidemia   . Cystocele, midline   . Unspecified essential hypertension   . Type II or unspecified type diabetes mellitus without mention of complication, not stated as uncontrolled   . Unspecified urinary incontinence   . Unspecified hereditary and idiopathic peripheral neuropathy   . Obesity, unspecified   . Kyphosis associated with other condition   . Chronic kidney disease   . PVC's (  premature ventricular contractions) 01/29/2015  . RBBB 01/29/2015    History reviewed. No pertinent past surgical history.  Allergies  Allergen Reactions  . Keflex [Cephalexin]   . Metronidazole   . Simvastatin     Leg cramsps   Medications: Patient's Medications  New Prescriptions   No medications on file  Previous Medications   AMBULATORY NON FORMULARY MEDICATION    wheelchair   ASPIRIN 81 MG TABLET    Take 81 mg by mouth daily.   CALCIUM-VITAMIN D (OSCAL WITH D) 500-200 MG-UNIT PER TABLET    Take 1 tablet by mouth daily.   CHOLECALCIFEROL (VITAMIN D) 1000 UNITS TABLET     Take 1,000 Units by mouth daily.   GABAPENTIN (NEURONTIN) 300 MG CAPSULE    Take one tablet by mouth in the morning and one tablet by mouth in the evening for pains   LISINOPRIL-HYDROCHLOROTHIAZIDE (PRINZIDE,ZESTORETIC) 20-12.5 MG PER TABLET    Take one tablet by mouth once daily to control blood pressure   LUBIPROSTONE (AMITIZA) 24 MCG CAPSULE    Take 1 capsule (24 mcg total) by mouth daily with supper.   PRAVASTATIN (PRAVACHOL) 20 MG TABLET    Take 1 tablet ( 20 mg ) by mouth with evening meal   VITAMIN E 400 UNIT CAPSULE    Take 400 Units by mouth daily.  Modified Medications   Modified Medication Previous Medication   DESOXIMETASONE (TOPICORT) 0.05 % CREAM desoximetasone (TOPICORT) 0.05 % cream      Apply topically 2 (two) times daily.    Apply topically 2 (two) times daily.   GLIPIZIDE (GLUCOTROL) 5 MG TABLET glipiZIDE (GLUCOTROL) 5 MG tablet      TAKE 1 TABLET DAILY (AS OF 05/07/14)    TAKE 1 TABLET DAILY (AS OF 05/07/14)   LINAGLIPTIN (TRADJENTA) 5 MG TABS TABLET linagliptin (TRADJENTA) 5 MG TABS tablet      Take 1 tablet (5 mg total) by mouth daily.    Take 1 tablet (5 mg total) by mouth daily.   OXYCODONE-ACETAMINOPHEN (ROXICET) 5-325 MG PER TABLET oxyCODONE-acetaminophen (ROXICET) 5-325 MG per tablet      Take 1 tablet by mouth every 8 (eight) hours as needed for severe pain.    Take 1 tablet by mouth every 8 (eight) hours as needed for severe pain.  Discontinued Medications   SENNOSIDES-DOCUSATE SODIUM (SENOKOT-S) 8.6-50 MG TABLET    Take 1 tablet by mouth daily.    Physical Exam: Filed Vitals:   07/30/15 1039  BP: 110/76  Pulse: 78  Temp: 97.6 F (36.4 C)  TempSrc: Oral  Resp: 20  Height: 5\' 6"  (1.676 m)  Weight: 175 lb 3.2 oz (79.47 kg)  SpO2: 97%   Physical Exam  Constitutional: She is oriented to person, place, and time. She appears well-developed and well-nourished. No distress.  Cardiovascular: Normal rate, regular rhythm, normal heart sounds and intact distal  pulses.   Pulmonary/Chest: Effort normal and breath sounds normal. No respiratory distress.  Abdominal: Soft. Bowel sounds are normal. She exhibits no distension. There is no tenderness.  Musculoskeletal: Normal range of motion. She exhibits no edema or tenderness.  No reproducible pain during exam  Neurological: She is alert and oriented to person, place, and time.  Skin: Skin is warm and dry.  Psychiatric: She has a normal mood and affect. Her behavior is normal. Judgment and thought content normal.    Labs reviewed: Basic Metabolic Panel:  Recent Labs  01/06/15 0951 07/20/15 0918  NA 143 143  K  4.5 4.5  CL 104 104  CO2 23 24  GLUCOSE 118* 138*  BUN 24 27  CREATININE 1.63* 1.56*  CALCIUM 9.4 9.2   Liver Function Tests:  Recent Labs  01/06/15 0951 01/21/15 1157 07/20/15 0918  AST 26 22 20   ALT 17 19 14   ALKPHOS 139* 125* 102  BILITOT 0.3 0.3 0.3  PROT 6.9 6.8 6.6   No results for input(s): LIPASE, AMYLASE in the last 8760 hours. No results for input(s): AMMONIA in the last 8760 hours. CBC:  Recent Labs  01/06/15 0951 07/20/15 0918  WBC 9.8 7.0  NEUTROABS 6.7 4.2  HGB 10.5*  --   HCT 32.9* 34.6  MCV 82  --   PLT 294  --    Lipid Panel:  Recent Labs  01/06/15 0951 07/20/15 0918  CHOL 171 191  HDL 41 45  LDLCALC 101* 121*  TRIG 147 123  CHOLHDL 4.2 4.2   Lab Results  Component Value Date   HGBA1C 6.7* 07/20/2015    Assessment/Plan 1. Spinal stenosis, lumbar region, with neurogenic claudication - I still think her leg cramps come from her progressing spinal stenosis -cont roxicet, and recommend she use at least a cane for stability - oxyCODONE-acetaminophen (ROXICET) 5-325 MG per tablet; Take 1 tablet by mouth every 8 (eight) hours as needed for severe pain.  Dispense: 90 tablet; Refill: 0  2. Type 2 diabetes mellitus with diabetic nephropathy -well controlled, cont tradjenta, if average remains excellent, may stop glipizide to avoid  hypoglycemia  3. Therapeutic opioid induced constipation -cont senna-s as needed for constipation and healthy high fiber diet with enough fluids  4. Chronic kidney disease, stage 3 (moderate) - stable, is on ace,hydrate well, avoid nsaids  5. Essential hypertension, benign -bp well controlled with current ace/hctz so continue and denies dizziness  6. Hyperlipidemia -has started pravachol low dose to get LDL to goal <70; last was 101  7.  Senile osteopenia:  Referred for f/u bone density; cont ca with D and see where she stands; may benefit from prolia if has declined into the osteoporotic range  8.  Need for pneumovax 23:  Given today and had prevnar last year. Labs/tests ordered: Orders Placed This Encounter  Procedures  . DG Bone Density    Standing Status: Future     Number of Occurrences:      Standing Expiration Date: 09/28/2016    Order Specific Question:  Reason for Exam (SYMPTOM  OR DIAGNOSIS REQUIRED)    Answer:  senile osteopenia    Order Specific Question:  Preferred imaging location?    Answer:  [facility]  . Hemoglobin A1c    Standing Status: Future     Number of Occurrences:      Standing Expiration Date: 07/29/2016  . Lipid panel    Standing Status: Future     Number of Occurrences:      Standing Expiration Date: 07/29/2016    Order Specific Question:  Has the patient fasted?    Answer:  Yes  . CBC with Differential/Platelet    Standing Status: Future     Number of Occurrences:      Standing Expiration Date: 07/29/2016  . Comprehensive metabolic panel    Standing Status: Future     Number of Occurrences:      Standing Expiration Date: 07/29/2016    Order Specific Question:  Has the patient fasted?    Answer:  Yes  . Ambulatory referral to Physical Therapy  Referral Priority:  Routine    Referral Type:  Physical Medicine    Referral Reason:  Specialty Services Required    Requested Specialty:  Physical Therapy    Number of Visits Requested:  1     Next appt: 6 mos for annual exam with labs before  Dontray Haberland L. [place], D.O. [facility] [address], [place] [address] Cell Phone (Mon-Fri 8am-5pm):  [phone] On Call:  [phone] & follow prompts after 5pm & weekends Office Phone:  [phone] Office Fax:  [phone]

## 2015-08-03 ENCOUNTER — Other Ambulatory Visit: Payer: Self-pay | Admitting: *Deleted

## 2015-08-03 MED ORDER — DESOXIMETASONE 0.05 % EX CREA: TOPICAL_CREAM | Freq: Two times a day (BID) | CUTANEOUS | Status: DC

## 2015-08-03 NOTE — Telephone Encounter (Signed)
[facility]

## 2015-08-14 ENCOUNTER — Other Ambulatory Visit: Payer: Self-pay

## 2015-08-14 MED ORDER — PRAVASTATIN SODIUM 20 MG PO TABS: ORAL_TABLET | ORAL | Status: DC

## 2015-08-14 NOTE — Telephone Encounter (Signed)
Fax request for Pravastatin 20 mg 90 day supply from [facility] - faxed

## 2015-08-18 ENCOUNTER — Ambulatory Visit: Payer: Medicare Other | Attending: Internal Medicine

## 2015-08-18 DIAGNOSIS — R29818 Other symptoms and signs involving the nervous system: Secondary | ICD-10-CM | POA: Insufficient documentation

## 2015-08-18 DIAGNOSIS — M25562 Pain in left knee: Secondary | ICD-10-CM | POA: Diagnosis present

## 2015-08-18 DIAGNOSIS — R6889 Other general symptoms and signs: Secondary | ICD-10-CM | POA: Insufficient documentation

## 2015-08-18 DIAGNOSIS — R262 Difficulty in walking, not elsewhere classified: Secondary | ICD-10-CM | POA: Diagnosis present

## 2015-08-18 DIAGNOSIS — R293 Abnormal posture: Secondary | ICD-10-CM

## 2015-08-18 NOTE — Patient Instructions (Signed)
Gait training with use of SPC keeping contact with floor as much as able . She did not need to match any particular leg and both were of equal strength and decreased balanc

## 2015-08-18 NOTE — Therapy (Signed)
[place], [place], [address] Phone: [phone]   Fax:  [phone]  Physical Therapy Evaluation  Patient Details  Name: Dawn [name] MRN: [id number] Date of Birth: [birth year] Referring Provider:  Gayland Curry, DO  Encounter Date: 08/18/2015      PT End of Session - 08/18/15 1321    Visit Number 1   Number of Visits 5   Date for PT Re-Evaluation 09/04/15   Authorization Type [employer]   Authorization - Visit Number 1   Authorization - Number of Visits 5   PT Start Time [id number]   PT Stop Time 1324   PT Time Calculation (min) 48 min   Activity Tolerance Patient tolerated treatment well   Behavior During Therapy [facility] for tasks assessed/performed      Past Medical History  Diagnosis Date  . Unspecified constipation   . Chronic kidney disease, stage III (moderate)   . Disorder of bone and cartilage, unspecified   . Other hammer toe (acquired)   . Anorexia   . Postmenopausal atrophic vaginitis   . Anemia, unspecified   . Reflux esophagitis   . Unspecified disorder of kidney and ureter   . Osteoarthrosis, unspecified whether generalized or localized, unspecified site   . Other and unspecified hyperlipidemia   . Cystocele, midline   . Unspecified essential hypertension   . Type II or unspecified type diabetes mellitus without mention of complication, not stated as uncontrolled   . Unspecified urinary incontinence   . Unspecified hereditary and idiopathic peripheral neuropathy   . Obesity, unspecified   . Kyphosis associated with other condition   . Chronic kidney disease   . PVC's (premature ventricular contractions) 01/29/2015  . RBBB 01/29/2015    No past surgical history on file.  There were no vitals filed for this visit.  Visit Diagnosis:  Abnormal posture - Plan: PT plan of care cert/re-cert  Activity intolerance - Plan: PT plan of care cert/re-cert  Difficulty walking - Plan: PT plan of  care cert/re-cert  Difficulty balancing - Plan: PT plan of care cert/re-cert      Subjective Assessment - 08/18/15 1243    Subjective She reports she is to learn how to use a cane and/ if she needs a wheelcare. She has LT leg pain with standing. She is stiff and sore after sitting for a period but this eases after moving a little.    Pertinent History Spinal surgery in 2013   How long can you sit comfortably? As needed   How long can you stand comfortably? .She is not sure as she uses stool for standing activity   How long can you walk comfortably? She is not sure but is able to walk into store but leans on cart to do this   Diagnostic tests Studies done last year: Stenosis   Patient Stated Goals Keep mobile and independent   Currently in Pain? No/denies  She has pain when on feet.             [facility] PT Assessment - 08/18/15 1248    Assessment   Medical Diagnosis Lumbar spinal stenosis   Onset Date/Surgical Date --  last 2 years or so.    Prior Therapy No. She had HHPT come in but was not appropriate for home health   Precautions   Precautions None   Restrictions   Weight Bearing Restrictions No   Balance Screen   Has the patient fallen in the past 6 months  No   Has the patient had a decrease in activity level because of a fear of falling?  No   Is the patient reluctant to leave their home because of a fear of falling?  No   Home Environment   Living Environment Private residence   Living Arrangements Other relatives   Home Access Stairs to enter  she goes in back which is level   Technical brewer of Steps 2   Entrance Stairs-Rails Right;Left;Can reach both   Home Layout One level   [place] - single point   Prior Function   Level of Independence Requires assistive device for independence   Cognition   Overall Cognitive Status Within Functional Limits for tasks assessed   Posture/Postural Control   Posture Comments Thoracic kyphosis and decreased  lumbar lordosis foward head   ROM / Strength   AROM / PROM / Strength AROM;Strength   AROM   AROM Assessment Site Lumbar   Lumbar Flexion She can touch her toes   Lumbar Extension 10   Lumbar - Right Side Bend 12   Lumbar - Left Side Bend 12   Strength   Overall Strength Comments LE WNL bilaterally   Standardized Balance Assessment   Standardized Balance Assessment Berg Balance Test;Timed Up and Go Test;10 meter walk test   Berg Balance Test   Sit to Stand Able to stand  independently using hands   Standing Unsupported Able to stand safely 2 minutes   Sitting with Back Unsupported but Feet Supported on Floor or Stool Able to sit safely and securely 2 minutes   Stand to Sit Controls descent by using hands   Transfers Able to transfer safely, definite need of hands   Standing Unsupported with Eyes Closed Able to stand 10 seconds safely   Standing Ubsupported with Feet Together Able to place feet together independently and stand 1 minute safely   From Standing, Reach Forward with Outstretched Arm Can reach forward >12 cm safely (5")   From Standing Position, Pick up Object from Floor Able to pick up shoe safely and easily   From Standing Position, Turn to Look Behind Over each Shoulder Looks behind from both sides and weight shifts well   Turn 360 Degrees Able to turn 360 degrees safely but slowly   Standing Unsupported, Alternately Place Feet on Step/Stool Able to stand independently and complete 8 steps >20 seconds   Standing Unsupported, One Foot in Front Able to plae foot ahead of the other independently and hold 30 seconds   Standing on One Leg Tries to lift leg/unable to hold 3 seconds but remains standing independently   Total Score 45                           PT Education - 08/18/15 1328    Education provided Yes   Education Details POC BERG score indications, gait traing   Person(s) Educated Patient   Methods Explanation;Demonstration;Verbal cues    Comprehension Returned demonstration;Verbalized understanding             PT Long Term Goals - 08/18/15 1325    PT LONG TERM GOAL #1   Title She will be independent wiht all HEP issued as of the last visit   Time 4   Period Weeks   Status New               Plan - 08/18/15 1322    Clinical Impression Statement Ms  Ardizzone presents with balance difficultys and decreased tolerance to activity on feet due to stenosis. I instructed her to use [facility] and she was able to retun demo correctly Her BERG score of 45/56 indicates she should be using the cane for decreased risk of fall. She wanted a HEP to improve LE strength to help with independent mobility.  She will do well with HEPpost PT She will benefit form the HEP   Pt will benefit from skilled therapeutic intervention in order to improve on the following deficits Difficulty walking;Decreased balance;Postural dysfunction   Rehab Potential Good   PT Frequency 2x / week   PT Duration 2 weeks   PT Treatment/Interventions Patient/family education;Balance training;Therapeutic exercise   PT Next Visit Plan Begin HEP   Consulted and Agree with Plan of Care Patient          G-Codes - 08/22/15 1327    Functional Assessment Tool Used FOTO 59%   Functional Limitation Mobility: Walking and moving around   Mobility: Walking and Moving Around Current Status [phone]) At least 40 percent but less than 60 percent impaired, limited or restricted   Mobility: Walking and Moving Around Goal Status [phone]) At least 40 percent but less than 60 percent impaired, limited or restricted       Problem List Patient Active Problem List   Diagnosis Date Noted  . PVC's (premature ventricular contractions) 01/29/2015  . RBBB 01/29/2015  . Hyperlipidemia 01/06/2015  . Osteopenia 01/06/2015  . Benign essential HTN 07/29/2014  . Weight gain 07/29/2014  . Therapeutic opioid induced constipation 07/29/2014  . Spinal stenosis, lumbar region, with neurogenic  claudication 04/30/2014  . Neuropathic pain 03/05/2014  . Bilateral leg pain 02/05/2014  . DM type 2 with diabetic peripheral neuropathy 02/05/2014  . Unspecified constipation 01/08/2014  . Leg pain 01/08/2014  . Routine general medical examination at a health care facility 01/08/2014  . DM type 2, uncontrolled, with renal complications [id number]  . Hypertensive renal disease 10/08/2013  . Need for prophylactic vaccination and inoculation against influenza 10/08/2013  . Generalized OA 07/23/2013  . UTI (urinary tract infection) 06/19/2013  . Hypoglycemia 06/19/2013  . Diabetes mellitus with nephropathy 03/21/2013  . Osteoarthrosis, unspecified whether generalized or localized, unspecified site   . Other and unspecified hyperlipidemia   . Chronic kidney disease     Darrel Hoover PT 08/22/15, 1:32 PM  [facility] [address] [place], [place], [address] Phone: [phone]   Fax:  [phone]

## 2015-08-31 ENCOUNTER — Ambulatory Visit: Payer: Medicare Other

## 2015-08-31 DIAGNOSIS — R29818 Other symptoms and signs involving the nervous system: Secondary | ICD-10-CM

## 2015-08-31 DIAGNOSIS — R293 Abnormal posture: Secondary | ICD-10-CM

## 2015-08-31 DIAGNOSIS — R6889 Other general symptoms and signs: Secondary | ICD-10-CM

## 2015-08-31 DIAGNOSIS — R262 Difficulty in walking, not elsewhere classified: Secondary | ICD-10-CM

## 2015-08-31 NOTE — Therapy (Signed)
[place], [place], [address] Phone: [phone]   Fax:  [phone]  Physical Therapy Treatment  Patient Details  Name: Dawn [name] MRN: [id number] Date of Birth: [birth year] Referring Provider:  Gayland Curry, DO  Encounter Date: 08/31/2015      PT End of Session - 08/31/15 1408    Visit Number 2   Number of Visits 5   Date for PT Re-Evaluation 09/04/15   Authorization Type Medicare   PT Start Time 0125   PT Stop Time 0208   PT Time Calculation (min) 43 min   Activity Tolerance Patient tolerated treatment well   Behavior During Therapy [facility] for tasks assessed/performed      Past Medical History  Diagnosis Date  . Unspecified constipation   . Chronic kidney disease, stage III (moderate)   . Disorder of bone and cartilage, unspecified   . Other hammer toe (acquired)   . Anorexia   . Postmenopausal atrophic vaginitis   . Anemia, unspecified   . Reflux esophagitis   . Unspecified disorder of kidney and ureter   . Osteoarthrosis, unspecified whether generalized or localized, unspecified site   . Other and unspecified hyperlipidemia   . Cystocele, midline   . Unspecified essential hypertension   . Type II or unspecified type diabetes mellitus without mention of complication, not stated as uncontrolled   . Unspecified urinary incontinence   . Unspecified hereditary and idiopathic peripheral neuropathy   . Obesity, unspecified   . Kyphosis associated with other condition   . Chronic kidney disease   . PVC's (premature ventricular contractions) 01/29/2015  . RBBB 01/29/2015    No past surgical history on file.  There were no vitals filed for this visit.  Visit Diagnosis:  Abnormal posture  Activity intolerance  Difficulty walking  Difficulty balancing                       [facility] Adult PT Treatment/Exercise - 08/31/15 1327    Exercises   Exercises Knee/Hip   Knee/Hip  Exercises: Aerobic   Recumbent Bike Noo load 5 min   Knee/Hip Exercises: Supine   Quad Sets Strengthening;Right;Left;15 reps   Quad Sets Limitations 3 pounds      See Pt instructions for specifics with exercise           PT Education - 08/31/15 1408    Education provided Yes   Education Details HEP   Person(s) Educated Patient   Methods Explanation;Tactile cues;Verbal cues;Handout;Demonstration   Comprehension Verbalized understanding;Returned demonstration             PT Long Term Goals - 08/18/15 1325    PT LONG TERM GOAL #1   Title She will be independent wiht all HEP issued as of the last visit   Time 4   Period Weeks   Status New               Plan - 08/31/15 1409    Clinical Impression Statement She tolerated the exercises without pain and with mild fatigue only. Add weight and exercies as tolerated Caution due to hamstring cramps   PT Next Visit Plan Add weight and progess exercise as able   PT Home Exercise Plan LE strength   Consulted and Agree with Plan of Care Patient        Problem List Patient Active Problem List   Diagnosis Date Noted  . PVC's (premature ventricular contractions) 01/29/2015  . RBBB  01/29/2015  . Hyperlipidemia 01/06/2015  . Osteopenia 01/06/2015  . Benign essential HTN 07/29/2014  . Weight gain 07/29/2014  . Therapeutic opioid induced constipation 07/29/2014  . Spinal stenosis, lumbar region, with neurogenic claudication 04/30/2014  . Neuropathic pain 03/05/2014  . Bilateral leg pain 02/05/2014  . DM type 2 with diabetic peripheral neuropathy 02/05/2014  . Unspecified constipation 01/08/2014  . Leg pain 01/08/2014  . Routine general medical examination at a health care facility 01/08/2014  . DM type 2, uncontrolled, with renal complications [id number]  . Hypertensive renal disease 10/08/2013  . Need for prophylactic vaccination and inoculation against influenza 10/08/2013  . Generalized OA 07/23/2013  . UTI  (urinary tract infection) 06/19/2013  . Hypoglycemia 06/19/2013  . Diabetes mellitus with nephropathy 03/21/2013  . Osteoarthrosis, unspecified whether generalized or localized, unspecified site   . Other and unspecified hyperlipidemia   . Chronic kidney disease     Dawn [name]   08/31/2015, 2:11 PM  [facility] [address] [place], [place], [address] Phone: [phone]   Fax:  [phone]

## 2015-08-31 NOTE — Patient Instructions (Signed)
KNEE: Extension, Long Arc Quads - Sitting   Raise leg until knee is straight. 15___ reps per set, _1-2__ sets per day, _5__ days per week   AD 3-5 POUNDS IF ABLEFUNCTIONAL MOBILITY: Wall Squat   Stance: shoulder-width on floor, against wall. Place feet in front of hips. Bend hips and knees. Keep back straight. Do not allow knees to bend past toes. Squeeze glutes and quads to stand. _10-12__ reps per set, _1__ sets per day, __5Bridge   Lie back, legs bent. Inhale, pressing hips up. Keeping ribs in, lengthen lower back. Exhale, rolling down along spine from top. Repeat _15___ times. Do _1___ sessions per day.  5 days per weekHip Flexion / Knee Extension: Straight-Leg Raise (Eccentric)   Lie on back. Lift leg with knee straight. Slowly lower leg for 3-5 seconds. __10-12_ reps per set, _1__ sets per day, _5__ days per week. Lower like elevator, stopping at each floor. Add _3-5__ lbs when able Rest on elbows. Rest on straight arms.  Copyright  VHI. All rights reserved.    Copyright  VHI. All rights reserved.  Clam   Lie on side, legs bent 90. Open top knee to ceiling, rotating leg outward. Touch toes to ankle of bottom leg. Close knees, rotating leg inward. Maintain hip position. Repeat _10-12 ___ times. Repeat on other side. Do __1__ sessions per day.  5 days per weekFunctional Quadriceps: Sit to Stand   Sit on edge of chair, feet flat on floor. Stand upright, extending knees fully. Repeat __10-12__ times per set. Do __1__ sets per session. Do __1-2__ sessions per day.  5 times per week  http://orth.exer.us/735   Copyright  VHI. All rights reserved.    Copyright  VHI. All rights reserved.    Copyright  VHI. All rights reserved.    Copyright  VHI. All rights reserved.

## 2015-09-02 ENCOUNTER — Ambulatory Visit: Payer: Medicare Other | Admitting: Physical Therapy

## 2015-09-02 DIAGNOSIS — R293 Abnormal posture: Secondary | ICD-10-CM

## 2015-09-02 DIAGNOSIS — R6889 Other general symptoms and signs: Secondary | ICD-10-CM

## 2015-09-02 DIAGNOSIS — R262 Difficulty in walking, not elsewhere classified: Secondary | ICD-10-CM

## 2015-09-02 DIAGNOSIS — R29818 Other symptoms and signs involving the nervous system: Secondary | ICD-10-CM

## 2015-09-02 NOTE — Therapy (Signed)
[place], [place], [address] Phone: [phone]   Fax:  [phone]  Physical Therapy Treatment  Patient Details  Name: Dawn [name] MRN: [id number] Date of Birth: [birth year] Referring Provider:  Gayland Curry, DO  Encounter Date: 09/02/2015      PT End of Session - 09/02/15 1435    Visit Number 3   Number of Visits 5   Date for PT Re-Evaluation 09/04/15   PT Start Time 1332   PT Stop Time 1415   PT Time Calculation (min) 43 min   Activity Tolerance Patient tolerated treatment well   Behavior During Therapy [facility] for tasks assessed/performed      Past Medical History  Diagnosis Date  . Unspecified constipation   . Chronic kidney disease, stage III (moderate)   . Disorder of bone and cartilage, unspecified   . Other hammer toe (acquired)   . Anorexia   . Postmenopausal atrophic vaginitis   . Anemia, unspecified   . Reflux esophagitis   . Unspecified disorder of kidney and ureter   . Osteoarthrosis, unspecified whether generalized or localized, unspecified site   . Other and unspecified hyperlipidemia   . Cystocele, midline   . Unspecified essential hypertension   . Type II or unspecified type diabetes mellitus without mention of complication, not stated as uncontrolled   . Unspecified urinary incontinence   . Unspecified hereditary and idiopathic peripheral neuropathy   . Obesity, unspecified   . Kyphosis associated with other condition   . Chronic kidney disease   . PVC's (premature ventricular contractions) 01/29/2015  . RBBB 01/29/2015    No past surgical history on file.  There were no vitals filed for this visit.  Visit Diagnosis:  Abnormal posture  Activity intolerance  Difficulty walking  Difficulty balancing      Subjective Assessment - 09/02/15 1354    Subjective Exercises started some of her home exercises.  No pain right now.  Has questions about using the cane .  It  slows her down.                           [facility] Adult PT Treatment/Exercise - 09/02/15 1356    Ambulation/Gait   Gait Comments gait training with SPC and in parallel bars for safety.  Cues forstep length given.  Pre gait exercise included rotation work, and weight shifting.     Knee/Hip Exercises: Aerobic   Recumbent Bike recumbant 5 minutes, no load   Knee/Hip Exercises: Seated   Long Arc Quad --  0, 3 LBS 10 X each   Sit to [employer] --  3 reps from mat   Knee/Hip Exercises: Supine   Short Arc Quad Sets 10 reps;2 sets  5 LBS   Knee/Hip Exercises: Sidelying   Clams 10  cues, both                PT Education - 09/02/15 1435    Education provided Yes   Education Details gait training with [facility]   Person(s) Educated Patient   Methods Explanation;Demonstration;Tactile cues;Verbal cues   Comprehension Verbalized understanding;Returned demonstration             PT Long Term Goals - 09/02/15 1441    PT LONG TERM GOAL #1   Title She will be independent wiht all HEP issued as of the last visit   Time 4   Period Weeks   Status On-going  Plan - 09/02/15 1436    Clinical Impression Statement No hamstring cramps today during exercise, she did note leg heavyness and fatigue LT with extended time up with gait training.  Able to tolerate some increase iwith weights for legs.        Problem List Patient Active Problem List   Diagnosis Date Noted  . PVC's (premature ventricular contractions) 01/29/2015  . RBBB 01/29/2015  . Hyperlipidemia 01/06/2015  . Osteopenia 01/06/2015  . Benign essential HTN 07/29/2014  . Weight gain 07/29/2014  . Therapeutic opioid induced constipation 07/29/2014  . Spinal stenosis, lumbar region, with neurogenic claudication 04/30/2014  . Neuropathic pain 03/05/2014  . Bilateral leg pain 02/05/2014  . DM type 2 with diabetic peripheral neuropathy 02/05/2014  . Unspecified constipation 01/08/2014  . Leg  pain 01/08/2014  . Routine general medical examination at a health care facility 01/08/2014  . DM type 2, uncontrolled, with renal complications [id number]  . Hypertensive renal disease 10/08/2013  . Need for prophylactic vaccination and inoculation against influenza 10/08/2013  . Generalized OA 07/23/2013  . UTI (urinary tract infection) 06/19/2013  . Hypoglycemia 06/19/2013  . Diabetes mellitus with nephropathy 03/21/2013  . Osteoarthrosis, unspecified whether generalized or localized, unspecified site   . Other and unspecified hyperlipidemia   . Chronic kidney disease     HARRIS,KAREN 09/02/2015, 2:43 PM  [facility] [address] [place], [place], [address] Phone: [phone]   Fax:  [phone]     Melvenia Needles, PTA 09/02/2015 2:43 PM Phone: [phone] Fax: [phone]

## 2015-09-07 ENCOUNTER — Ambulatory Visit: Payer: Medicare Other | Admitting: Physical Therapy

## 2015-09-07 DIAGNOSIS — R262 Difficulty in walking, not elsewhere classified: Secondary | ICD-10-CM

## 2015-09-07 DIAGNOSIS — R293 Abnormal posture: Secondary | ICD-10-CM | POA: Diagnosis not present

## 2015-09-07 DIAGNOSIS — R29818 Other symptoms and signs involving the nervous system: Secondary | ICD-10-CM

## 2015-09-07 DIAGNOSIS — R6889 Other general symptoms and signs: Secondary | ICD-10-CM

## 2015-09-07 NOTE — Therapy (Signed)
[place], [place], [address] Phone: [phone]   Fax:  [phone]  Physical Therapy Treatment  Patient Details  Name: Dawn [name] MRN: [id number] Date of Birth: 1928-03-14 Referring Provider:  Gayland Curry, DO  Encounter Date: 09/07/2015      PT End of Session - 09/07/15 1817    Visit Number 4   Date for PT Re-Evaluation 09/04/15   PT Start Time [id number]   PT Stop Time 1500   PT Time Calculation (min) 42 min   Activity Tolerance Patient tolerated treatment well;No increased pain   Behavior During Therapy [facility] for tasks assessed/performed      Past Medical History  Diagnosis Date  . Unspecified constipation   . Chronic kidney disease, stage III (moderate)   . Disorder of bone and cartilage, unspecified   . Other hammer toe (acquired)   . Anorexia   . Postmenopausal atrophic vaginitis   . Anemia, unspecified   . Reflux esophagitis   . Unspecified disorder of kidney and ureter   . Osteoarthrosis, unspecified whether generalized or localized, unspecified site   . Other and unspecified hyperlipidemia   . Cystocele, midline   . Unspecified essential hypertension   . Type II or unspecified type diabetes mellitus without mention of complication, not stated as uncontrolled   . Unspecified urinary incontinence   . Unspecified hereditary and idiopathic peripheral neuropathy   . Obesity, unspecified   . Kyphosis associated with other condition   . Chronic kidney disease   . PVC's (premature ventricular contractions) 01/29/2015  . RBBB 01/29/2015    No past surgical history on file.  There were no vitals filed for this visit.  Visit Diagnosis:  Activity intolerance  Abnormal posture  Difficulty walking  Difficulty balancing      Subjective Assessment - 09/07/15 1422    Subjective Did OK after last session,  Still getting her cramps intermittantly.                          [facility] Adult PT Treatment/Exercise - 09/07/15 1434    Ambulation/Gait   Gait Comments cane on level, noted longer step lengths she no longer has a step to pattern.  She admits to not using the cane at home,   Knee/Hip Exercises: Stretches   Passive Hamstring Stretch 3 reps;30 seconds   Gastroc Stretch 3 reps;30 seconds  manually   Knee/Hip Exercises: Standing   Heel Raises Both;10 reps   Forward Step Up Left;1 set;10 reps;Hand Hold: 2;Step Height: 4"   Knee/Hip Exercises: Supine   Short Arc Quad Sets Strengthening;Left;2 sets  5 LBS, 7 LBS each   Knee/Hip Exercises: Sidelying   Clams 10 X  cues initially   Manual Therapy   Manual therapy comments 2 fans Lt quads for edema ?Cramping                PT Education - 09/07/15 1816    Education provided Yes   Education Details gait training   Person(s) Educated Patient   Methods Explanation;Verbal cues   Comprehension Verbalized understanding;Returned demonstration             PT Long Term Goals - 09/07/15 1820    PT LONG TERM GOAL #1   Title She will be independent wiht all HEP issued as of the last visit   Time 4   Period Weeks   Status On-going  Plan - 09/07/15 1821    PT Next Visit Plan Add weight and progess exercise as able, balance standing   Consulted and Agree with Plan of Care Patient        Problem List Patient Active Problem List   Diagnosis Date Noted  . PVC's (premature ventricular contractions) 01/29/2015  . RBBB 01/29/2015  . Hyperlipidemia 01/06/2015  . Osteopenia 01/06/2015  . Benign essential HTN 07/29/2014  . Weight gain 07/29/2014  . Therapeutic opioid induced constipation 07/29/2014  . Spinal stenosis, lumbar region, with neurogenic claudication 04/30/2014  . Neuropathic pain 03/05/2014  . Bilateral leg pain 02/05/2014  . DM type 2 with diabetic peripheral neuropathy 02/05/2014  . Unspecified constipation 01/08/2014  . Leg pain 01/08/2014  . Routine general  medical examination at a health care facility 01/08/2014  . DM type 2, uncontrolled, with renal complications [id number]  . Hypertensive renal disease 10/08/2013  . Need for prophylactic vaccination and inoculation against influenza 10/08/2013  . Generalized OA 07/23/2013  . UTI (urinary tract infection) 06/19/2013  . Hypoglycemia 06/19/2013  . Diabetes mellitus with nephropathy 03/21/2013  . Osteoarthrosis, unspecified whether generalized or localized, unspecified site   . Other and unspecified hyperlipidemia   . Chronic kidney disease     HARRIS,KAREN 09/07/2015, 6:22 PM  [facility] [address] [place], [place], [address] Phone: [phone]   Fax:  [phone]     Melvenia Needles, PTA 09/07/2015 6:22 PM Phone: [phone] Fax: [phone]

## 2015-09-09 ENCOUNTER — Encounter: Payer: Self-pay | Admitting: Podiatry

## 2015-09-09 ENCOUNTER — Ambulatory Visit (INDEPENDENT_AMBULATORY_CARE_PROVIDER_SITE_OTHER): Payer: Medicare Other | Admitting: Podiatry

## 2015-09-09 DIAGNOSIS — M79673 Pain in unspecified foot: Secondary | ICD-10-CM

## 2015-09-09 DIAGNOSIS — B351 Tinea unguium: Secondary | ICD-10-CM

## 2015-09-09 DIAGNOSIS — E1142 Type 2 diabetes mellitus with diabetic polyneuropathy: Secondary | ICD-10-CM

## 2015-09-09 DIAGNOSIS — G629 Polyneuropathy, unspecified: Secondary | ICD-10-CM

## 2015-09-09 DIAGNOSIS — M79609 Pain in unspecified limb: Secondary | ICD-10-CM

## 2015-09-09 NOTE — Patient Instructions (Signed)
Diabetes and Foot Care Diabetes may cause you to have problems because of poor blood supply (circulation) to your feet and legs. This may cause the skin on your feet to become thinner, break easier, and heal more slowly. Your skin may become dry, and the skin may peel and crack. You may also have nerve damage in your legs and feet causing decreased feeling in them. You may not notice minor injuries to your feet that could lead to infections or more serious problems. Taking care of your feet is one of the most important things you can do for yourself.  HOME CARE INSTRUCTIONS  Wear shoes at all times, even in the house. Do not go barefoot. Bare feet are easily injured.  Check your feet daily for blisters, cuts, and redness. If you cannot see the bottom of your feet, use a mirror or ask someone for help.  Wash your feet with warm water (do not use hot water) and mild soap. Then pat your feet and the areas between your toes until they are completely dry. Do not soak your feet as this can dry your skin.  Apply a moisturizing lotion or petroleum jelly (that does not contain alcohol and is unscented) to the skin on your feet and to dry, brittle toenails. Do not apply lotion between your toes.  Trim your toenails straight across. Do not dig under them or around the cuticle. File the edges of your nails with an emery board or nail file.  Do not cut corns or calluses or try to remove them with medicine.  Wear clean socks or stockings every day. Make sure they are not too tight. Do not wear knee-high stockings since they may decrease blood flow to your legs.  Wear shoes that fit properly and have enough cushioning. To break in new shoes, wear them for just a few hours a day. This prevents you from injuring your feet. Always look in your shoes before you put them on to be sure there are no objects inside.  Do not cross your legs. This may decrease the blood flow to your feet.  If you find a minor scrape,  cut, or break in the skin on your feet, keep it and the skin around it clean and dry. These areas may be cleansed with mild soap and water. Do not cleanse the area with peroxide, alcohol, or iodine.  When you remove an adhesive bandage, be sure not to damage the skin around it.  If you have a wound, look at it several times a day to make sure it is healing.  Do not use heating pads or hot water bottles. They may burn your skin. If you have lost feeling in your feet or legs, you may not know it is happening until it is too late.  Make sure your health care provider performs a complete foot exam at least annually or more often if you have foot problems. Report any cuts, sores, or bruises to your health care provider immediately. SEEK MEDICAL CARE IF:   You have an injury that is not healing.  You have cuts or breaks in the skin.  You have an ingrown nail.  You notice redness on your legs or feet.  You feel burning or tingling in your legs or feet.  You have pain or cramps in your legs and feet.  Your legs or feet are numb.  Your feet always feel cold. SEEK IMMEDIATE MEDICAL CARE IF:   There is increasing redness,   swelling, or pain in or around a wound.  There is a red line that goes up your leg.  Pus is coming from a wound.  You develop a fever or as directed by your health care provider.  You notice a bad smell coming from an ulcer or wound. Document Released: 11/25/2000 Document Revised: 07/31/2013 Document Reviewed: 05/07/2013 Exit[employer] Patient Information 2015 [employer]. This information is not intended to replace advice given to you by your health care provider. Make sure you discuss any questions you have with your health care provider.  

## 2015-09-10 ENCOUNTER — Other Ambulatory Visit: Payer: Self-pay | Admitting: *Deleted

## 2015-09-10 ENCOUNTER — Ambulatory Visit: Payer: Medicare Other | Admitting: Physical Therapy

## 2015-09-10 DIAGNOSIS — R262 Difficulty in walking, not elsewhere classified: Secondary | ICD-10-CM

## 2015-09-10 DIAGNOSIS — R29818 Other symptoms and signs involving the nervous system: Secondary | ICD-10-CM

## 2015-09-10 DIAGNOSIS — R293 Abnormal posture: Secondary | ICD-10-CM | POA: Diagnosis not present

## 2015-09-10 DIAGNOSIS — M25562 Pain in left knee: Secondary | ICD-10-CM

## 2015-09-10 DIAGNOSIS — M48062 Spinal stenosis, lumbar region with neurogenic claudication: Secondary | ICD-10-CM

## 2015-09-10 DIAGNOSIS — R6889 Other general symptoms and signs: Secondary | ICD-10-CM

## 2015-09-10 MED ORDER — OXYCODONE-ACETAMINOPHEN 5-325 MG PO TABS
1.0000 | ORAL_TABLET | Freq: Three times a day (TID) | ORAL | Status: DC | PRN
Start: 2015-09-10 — End: 2015-10-30

## 2015-09-10 NOTE — Progress Notes (Signed)
Patient ID: Dawn [name], female   DOB: 03/31/1928, 81 y.o.   MRN: [id number]  This patient presents today for scheduled visit complaining of painful toenails when walking wearing shoes and TOENAIL debridement. Also, patient is requesting replacement diabetic shoes because the existing shoes are causing some discomfort in the toe areas on the right and left feet  Objective: Orientated 3 DP pulses 2/4 bilaterally PT pulses 1/4 bilaterally Capillary reflex immediate bilaterally  Neurological: Sensation to 10 g monofilament wire intact 3/5 bilaterally Vibratory sensation nonreactive bilaterally Reflex equal and reactive bilaterally  Dermatological: The toenails are elongated, brittle, hypertrophic, discolored, incurvated and tender direct palpation 6-10 No open skin lesions noted bilaterally  Musculoskeletal: Hammertoe deformities 2-5 bilaterally  Assessment: 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 Mariea Clonts  Reappoint 3 months for nail debridement and return patient upon receipt of certification for diabetic shoes Decreased sensation to 10 g monofilament wire

## 2015-09-10 NOTE — Patient Instructions (Addendum)
Reviewed CLAM and re-issuedAbduction: Clam (Eccentric) - Side-Lying   Abduction: Clam (Eccentric) - Side-Lying   Copyright  VHI. All rights reserved.   Lie on side with knees bent. Lift top knee, keeping feet together. Keep trunk steady. Slowly lower for 3-5 seconds. _10__ reps per set, ___2 sets per day, __5_ days per week. Add ___ lbs when you achieve ___ repetitions.  http://ecce.exer.us/65   Copyright  VHI. All rights reserved.  Knee to Chest   Lying supine, bend involved knee to chest __3_ times. Repeat with other leg. Do __2_ times per day.  Copyright  VHI. All rights reserved.

## 2015-09-10 NOTE — Therapy (Signed)
[place], [place], [address] Phone: [phone]   Fax:  [phone]  Physical Therapy Treatment- Re-certification  Patient Details  Name: Dawn [name] MRN: [id number] Date of Birth: Apr 21, 1928 Referring Provider:  Gayland Curry, DO  Encounter Date: 09/10/2015      PT End of Session - 09/10/15 1500    Visit Number 5   Number of Visits 13   Date for PT Re-Evaluation 10/08/15   PT Start Time [id number]   PT Stop Time 1508   PT Time Calculation (min) 53 min   Activity Tolerance Patient tolerated treatment well;No increased pain   Behavior During Therapy [facility] for tasks assessed/performed      Past Medical History  Diagnosis Date  . Unspecified constipation   . Chronic kidney disease, stage III (moderate)   . Disorder of bone and cartilage, unspecified   . Other hammer toe (acquired)   . Anorexia   . Postmenopausal atrophic vaginitis   . Anemia, unspecified   . Reflux esophagitis   . Unspecified disorder of kidney and ureter   . Osteoarthrosis, unspecified whether generalized or localized, unspecified site   . Other and unspecified hyperlipidemia   . Cystocele, midline   . Unspecified essential hypertension   . Type II or unspecified type diabetes mellitus without mention of complication, not stated as uncontrolled   . Unspecified urinary incontinence   . Unspecified hereditary and idiopathic peripheral neuropathy   . Obesity, unspecified   . Kyphosis associated with other condition   . Chronic kidney disease   . PVC's (premature ventricular contractions) 01/29/2015  . RBBB 01/29/2015    No past surgical history on file.  There were no vitals filed for this visit.  Visit Diagnosis:  Activity intolerance - Plan: PT plan of care cert/re-cert  Difficulty balancing - Plan: PT plan of care cert/re-cert  Abnormal posture - Plan: PT plan of care cert/re-cert  Difficulty walking - Plan: PT plan of care  cert/re-cert  Pain in joint, lower leg, left - Plan: PT plan of care cert/re-cert      Subjective Assessment - 09/10/15 1422    Subjective Patient feels like she is about the same since she started coming.  Pain still intense but maybe it doesnt last as long as it used to.    Currently in Pain? Yes  In AM, pain increases 7/10-8/10. none current   Pain Score 7    Pain Location Leg   Pain Orientation Left;Posterior   Pain Descriptors / Indicators Sore   Pain Type Chronic pain   Pain Radiating Towards thigh down to calf   Pain Onset More than a month ago   Pain Frequency Intermittent   Aggravating Factors  AM, standing too long, walking    Pain Relieving Factors sitting ,meds, exercises help   Multiple Pain Sites Yes                         [facility] Adult PT Treatment/Exercise - 09/10/15 1441    Timed Up and Go Test   TUG Normal TUG   Normal TUG (seconds) 18  3 trials, 24 sec with cane, 19 sec without and 18 without   Lumbar Exercises: Stretches   Active Hamstring Stretch 2 reps;30 seconds   Single Knee to Chest Stretch 3 reps;30 seconds   Lower Trunk Rotation 5 reps   Lower Trunk Rotation Limitations x 10   Knee/Hip Exercises: Aerobic   Nustep  NuStep L1, 8 min    Knee/Hip Exercises: Standing   Wall Squat 1 set;10 reps   Knee/Hip Exercises: Supine   Bridges Limitations x 10    Straight Leg Raises Strengthening;Both;1 set;10 reps    Seated LAQ x 20  Sit to stand x 10   Hip Abd x 10 each leg in sidelying Increased time to process instructions for single knee to chest         PT Education - 09/10/15 1500    Education provided Yes   Education Details HEP, spinal stenosis, TUG results    Person(s) Educated Patient   Methods Explanation;Demonstration;Tactile cues;Verbal cues;Handout   Comprehension Verbalized understanding;Returned demonstration             PT Long Term Goals - 09/10/15 1444    PT LONG TERM GOAL #1   Title She will be independent  with all HEP issued as of the last visit   Status On-going   PT LONG TERM GOAL #2   Title Patient will be able to stand to cook a light meal for 20 min with min fatigue, pain in LE   Time 4   Period Weeks   Status New   PT LONG TERM GOAL #3   Title Pt will be able to walk for 20 min at a time for community outings using cane if needed.    Time 4   Period Weeks   Status New   PT LONG TERM GOAL #4   Title Pt will complete TUG or [place] and set goal    _______   Time 4   Period Weeks   Status Achieved   PT LONG TERM GOAL #5   Title Pt will score less than 15 sec to TUG to improve mobility, fall risk   Time 4   Period Weeks   Status New               Plan - 09/10/15 1509    Clinical Impression Statement Pt noted the tape did not help much.  Needs max cues for HEP and technique.  TUG done, pt scores above 18 sec and does not look forward, likelly due to neuropathy. Pt will benefit from about 1 more month of PT to address gait and mobility, lessen pain in LE.    Pt will benefit from skilled therapeutic intervention in order to improve on the following deficits Difficulty walking;Decreased balance;Postural dysfunction;Decreased activity tolerance;Impaired flexibility;Pain;Increased fascial restricitons   Rehab Potential Good   PT Frequency 2x / week   PT Duration 4 weeks   PT Treatment/Interventions Patient/family education;Balance training;Therapeutic exercise;ADLs/Self Care Home Management;Gait training;Therapeutic activities;Neuromuscular re-education;Moist Heat;Functional mobility training   PT Next Visit Plan standing ther ex as tolerated, trunk flexibility   PT Home Exercise Plan added single knee to chest and clam    Consulted and Agree with Plan of Care Patient        Problem List Patient Active Problem List   Diagnosis Date Noted  . PVC's (premature ventricular contractions) 01/29/2015  . RBBB 01/29/2015  . Hyperlipidemia 01/06/2015  . Osteopenia 01/06/2015  .  Benign essential HTN 07/29/2014  . Weight gain 07/29/2014  . Therapeutic opioid induced constipation 07/29/2014  . Spinal stenosis, lumbar region, with neurogenic claudication 04/30/2014  . Neuropathic pain 03/05/2014  . Bilateral leg pain 02/05/2014  . DM type 2 with diabetic peripheral neuropathy 02/05/2014  . Unspecified constipation 01/08/2014  . Leg pain 01/08/2014  . Routine general medical examination at a health  care facility 01/08/2014  . DM type 2, uncontrolled, with renal complications [id number]  . Hypertensive renal disease 10/08/2013  . Need for prophylactic vaccination and inoculation against influenza 10/08/2013  . Generalized OA 07/23/2013  . UTI (urinary tract infection) 06/19/2013  . Hypoglycemia 06/19/2013  . Diabetes mellitus with nephropathy 03/21/2013  . Osteoarthrosis, unspecified whether generalized or localized, unspecified site   . Other and unspecified hyperlipidemia   . Chronic kidney disease     PAA,JENNIFER 09/10/2015, 3:16 PM  [facility] [address] [place], [place], [address] Phone: [phone]   Fax:  [phone]   Raeford Razor, PT 09/10/2015 3:17 PM Phone: [phone] Fax: [phone]

## 2015-09-10 NOTE — Telephone Encounter (Signed)
Patient requested and will pick up 

## 2015-09-14 ENCOUNTER — Encounter: Payer: Medicare Other | Admitting: Physical Therapy

## 2015-09-16 ENCOUNTER — Encounter: Payer: Medicare Other | Admitting: Physical Therapy

## 2015-09-16 ENCOUNTER — Other Ambulatory Visit: Payer: Self-pay | Admitting: Internal Medicine

## 2015-09-16 ENCOUNTER — Telehealth: Payer: Self-pay | Admitting: Internal Medicine

## 2015-09-16 NOTE — Telephone Encounter (Signed)
Spoke with Dawn [name], says she is interested in trying the Back Brace by [employer] she is having problems with her legs, says she was  told that her leg pain is coming from  problems  with her back.  She wants to try the to see if the brace would make a differents.  Form put in Dr. Magdalene Molly folder.  cdavis

## 2015-09-21 ENCOUNTER — Telehealth: Payer: Self-pay | Admitting: *Deleted

## 2015-09-21 NOTE — Telephone Encounter (Signed)
Received fax from [facility] form for Therapeutic Shoes. Patient saw Dr. Amalia Hailey at [facility] to be fitted for Therapeutic footwear. Given to Dr. Mariea Clonts to review and sign and fax back to fax#: [phone]

## 2015-09-22 ENCOUNTER — Ambulatory Visit: Payer: Medicare Other | Attending: Internal Medicine

## 2015-09-22 DIAGNOSIS — R29818 Other symptoms and signs involving the nervous system: Secondary | ICD-10-CM | POA: Diagnosis present

## 2015-09-22 DIAGNOSIS — R293 Abnormal posture: Secondary | ICD-10-CM | POA: Diagnosis present

## 2015-09-22 DIAGNOSIS — R262 Difficulty in walking, not elsewhere classified: Secondary | ICD-10-CM

## 2015-09-22 DIAGNOSIS — R6889 Other general symptoms and signs: Secondary | ICD-10-CM | POA: Insufficient documentation

## 2015-09-22 NOTE — Therapy (Signed)
[place], [place], [address] Phone: [phone]   Fax:  [phone]  Physical Therapy Treatment  Patient Details  Name: Dawn [name] MRN: [id number] Date of Birth: 1928/03/12 Referring Provider:  Gayland Curry, DO  Encounter Date: 09/22/2015      PT End of Session - 09/22/15 1500    Visit Number 6   Number of Visits 13   Date for PT Re-Evaluation 10/08/15   PT Start Time 0215   PT Stop Time 0254   PT Time Calculation (min) 39 min   Activity Tolerance Patient tolerated treatment well   Behavior During Therapy [facility] for tasks assessed/performed      Past Medical History  Diagnosis Date  . Unspecified constipation   . Chronic kidney disease, stage III (moderate)   . Disorder of bone and cartilage, unspecified   . Other hammer toe (acquired)   . Anorexia   . Postmenopausal atrophic vaginitis   . Anemia, unspecified   . Reflux esophagitis   . Unspecified disorder of kidney and ureter   . Osteoarthrosis, unspecified whether generalized or localized, unspecified site   . Other and unspecified hyperlipidemia   . Cystocele, midline   . Unspecified essential hypertension   . Type II or unspecified type diabetes mellitus without mention of complication, not stated as uncontrolled   . Unspecified urinary incontinence   . Unspecified hereditary and idiopathic peripheral neuropathy   . Obesity, unspecified   . Kyphosis associated with other condition   . Chronic kidney disease   . PVC's (premature ventricular contractions) 01/29/2015  . RBBB 01/29/2015    No past surgical history on file.  There were no vitals filed for this visit.  Visit Diagnosis:  Activity intolerance  Difficulty balancing  Abnormal posture  Difficulty walking      Subjective Assessment - 09/22/15 1423    Subjective She reports she feels no better. She has done her HEP the past 3 days.    Currently in Pain? Yes   Pain  Score 7    Pain Orientation Left;Posterior   Pain Descriptors / Indicators Sore   Pain Type Chronic pain   Pain Radiating Towards thigh to calf   Pain Onset More than a month ago   Pain Frequency Intermittent   Aggravating Factors  stading , walking      Above pain description is for in AM but she had no pain today with exercise.                     [place] Adult PT Treatment/Exercise - 09/22/15 1425    Lumbar Exercises: Stretches   Lower Trunk Rotation 2 reps;20 seconds   Lower Trunk Rotation Limitations and 10 reps RT and LT with one knee crossed over other.    Pelvic Tilt Limitations 12 reps with cues to rotate pelvis   Knee/Hip Exercises: Aerobic   Nustep NuStep L2, 6 min    Knee/Hip Exercises: Seated   Long Arc Quad Strengthening;Both;15 reps   Knee/Hip Exercises: Supine   Bridges Limitations 15 reps bilateral bridge   Knee/Hip Exercises: Sidelying   Hip ABduction Strengthening;Right;Left;10 reps   Hip ABduction Limitations 3 pounds   Clams x15 with 3 pounds at knee RT and LT                     PT Long Term Goals - 09/22/15 1503    PT LONG TERM GOAL #1   Title  She will be independent with all HEP issued as of the last visit   Status On-going   PT LONG TERM GOAL #2   Title Patient will be able to stand to cook a light meal for 20 min with min fatigue, pain in LE   Status On-going   PT LONG TERM GOAL #3   Title Pt will be able to walk for 20 min at a time for community outings using cane if needed.    Status On-going               Plan - 09/22/15 1501    Clinical Impression Statement No pain with exercise but some hamstring cramping. We discussed that we would probably not stop AM pain but her HEP should help with function . She agreed to continue 1-2 more times to finish HEP then discharge   PT Next Visit Plan Review all HEP and add as needed   Consulted and Agree with Plan of Care Patient     BERG next visit   Problem  List Patient Active Problem List   Diagnosis Date Noted  . PVC's (premature ventricular contractions) 01/29/2015  . RBBB 01/29/2015  . Hyperlipidemia 01/06/2015  . Osteopenia 01/06/2015  . Benign essential HTN 07/29/2014  . Weight gain 07/29/2014  . Therapeutic opioid induced constipation 07/29/2014  . Spinal stenosis, lumbar region, with neurogenic claudication 04/30/2014  . Neuropathic pain 03/05/2014  . Bilateral leg pain 02/05/2014  . DM type 2 with diabetic peripheral neuropathy ([place]) 02/05/2014  . Unspecified constipation 01/08/2014  . Leg pain 01/08/2014  . Routine general medical examination at a health care facility 01/08/2014  . DM type 2, uncontrolled, with renal complications ([place]) [id number]  . Hypertensive renal disease 10/08/2013  . Need for prophylactic vaccination and inoculation against influenza 10/08/2013  . Generalized OA 07/23/2013  . UTI (urinary tract infection) 06/19/2013  . Hypoglycemia 06/19/2013  . Diabetes mellitus with nephropathy ([place]) 03/21/2013  . Osteoarthrosis, unspecified whether generalized or localized, unspecified site   . Other and unspecified hyperlipidemia   . Chronic kidney disease     Darrel Hoover PT 09/22/2015, 3:04 PM  [facility] [address] [place], [place], [address] Phone: [phone]   Fax:  [phone]

## 2015-09-23 ENCOUNTER — Encounter: Payer: Medicare Other | Admitting: Physical Therapy

## 2015-09-25 LAB — HM DEXA SCAN

## 2015-09-28 ENCOUNTER — Ambulatory Visit: Payer: Medicare Other

## 2015-09-28 DIAGNOSIS — R262 Difficulty in walking, not elsewhere classified: Secondary | ICD-10-CM

## 2015-09-28 DIAGNOSIS — R6889 Other general symptoms and signs: Secondary | ICD-10-CM

## 2015-09-28 DIAGNOSIS — R29818 Other symptoms and signs involving the nervous system: Secondary | ICD-10-CM

## 2015-09-28 NOTE — Therapy (Addendum)
[place], [place], [address] Phone: [phone]   Fax:  [phone]  Physical Therapy Treatment  Patient Details  Name: Dawn [name] MRN: [id number] Date of Birth: September 26, 1928 No Data Recorded  Encounter Date: 09/28/2015      PT End of Session - 09/28/15 1744    Visit Number 7   Number of Visits 13   Date for PT Re-Evaluation 10/08/15   PT Start Time 0215   PT Stop Time 0300   PT Time Calculation (min) 45 min   Activity Tolerance Patient tolerated treatment well   Behavior During Therapy [facility] for tasks assessed/performed      Past Medical History  Diagnosis Date  . Unspecified constipation   . Chronic kidney disease, stage III (moderate)   . Disorder of bone and cartilage, unspecified   . Other hammer toe (acquired)   . Anorexia   . Postmenopausal atrophic vaginitis   . Anemia, unspecified   . Reflux esophagitis   . Unspecified disorder of kidney and ureter   . Osteoarthrosis, unspecified whether generalized or localized, unspecified site   . Other and unspecified hyperlipidemia   . Cystocele, midline   . Unspecified essential hypertension   . Type II or unspecified type diabetes mellitus without mention of complication, not stated as uncontrolled   . Unspecified urinary incontinence   . Unspecified hereditary and idiopathic peripheral neuropathy   . Obesity, unspecified   . Kyphosis associated with other condition   . Chronic kidney disease   . PVC's (premature ventricular contractions) 01/29/2015  . RBBB 01/29/2015    No past surgical history on file.  There were no vitals filed for this visit.  Visit Diagnosis:  Activity intolerance  Difficulty balancing  Difficulty walking      Subjective Assessment - 09/28/15 1741    Subjective No pain today. She is ready for discharge with HEP   Currently in Pain? No/denies                         [facility] Adult PT  Treatment/Exercise - 09/28/15 1418    Lumbar Exercises: Stretches   Single Knee to Chest Stretch 4 reps;20 seconds   Lower Trunk Rotation Limitations x20 RT and LT.    Pelvic Tilt Limitations 15 reps Motion is limited but she is able to do the tilt   Knee/Hip Exercises: Aerobic   Nustep NuStep L2, 6 min LE only   Knee/Hip Exercises: Seated   Long Arc Quad Strengthening;Right;Left;20 reps   Long Arc Quad Weight 3 lbs.   Knee/Hip Exercises: Supine   Bridges Limitations 20 reps bilateral   Straight Leg Raises Right;Left;20 reps   Knee/Hip Exercises: Sidelying   Hip ABduction Right;Left;15 reps   Hip ABduction Limitations 3 pounds   Clams x20 with 3 pounds at knee RT and LT   Other Sidelying Knee/Hip Exercises RT and LT hip x 20                PT Education - 09/28/15 1743    Education provided Yes   Education Details HEP review and rolling for core strength   Person(s) Educated Patient   Methods Explanation;Demonstration;Verbal cues;Tactile cues   Comprehension Returned demonstration;Verbalized understanding             PT Long Term Goals - 09/28/15 1746    PT LONG TERM GOAL #1   Title She will be independent with all HEP issued as of  the last visit   Status Achieved   PT LONG TERM GOAL #2   Title Patient will be able to stand to cook a light meal for 20 min with min fatigue, pain in LE   Status Not Met   PT LONG TERM GOAL #3   Title Pt will be able to walk for 20 min at a time for community outings using cane if needed.    Status Partially Met   PT LONG TERM GOAL #5   Title Pt will score less than 15 sec to TUG to improve mobility, fall risk   Status Unable to assess               Plan - 10/02/2015 1745    Clinical Impression Statement Ms Duff pain  unchanged with AM pain and pain when on feet., No pain sitting or lying. She is able to do HEP well. Pt agrees to discharge   Pt will benefit from skilled therapeutic intervention in order to improve on the  following deficits Abnormal gait   PT Next Visit Plan discharge today with HEP   Consulted and Agree with Plan of Care Patient          G-Codes - 02-Oct-2015 1821    Functional Assessment Tool Used FOTO 41%   Functional Limitation Mobility: Walking and moving around   Mobility: Walking and Moving Around Goal Status [phone]) At least 40 percent but less than 60 percent impaired, limited or restricted   Mobility: Walking and Moving Around Discharge Status [phone]) At least 40 percent but less than 60 percent impaired, limited or restricted      Problem List Patient Active Problem List   Diagnosis Date Noted  . PVC's (premature ventricular contractions) 01/29/2015  . RBBB 01/29/2015  . Hyperlipidemia 01/06/2015  . Osteopenia 01/06/2015  . Benign essential HTN 07/29/2014  . Weight gain 07/29/2014  . Therapeutic opioid induced constipation 07/29/2014  . Spinal stenosis, lumbar region, with neurogenic claudication 04/30/2014  . Neuropathic pain 03/05/2014  . Bilateral leg pain 02/05/2014  . DM type 2 with diabetic peripheral neuropathy ([place]) 02/05/2014  . Unspecified constipation 01/08/2014  . Leg pain 01/08/2014  . Routine general medical examination at a health care facility 01/08/2014  . DM type 2, uncontrolled, with renal complications ([place]) 47/08/2956  . Hypertensive renal disease 10/08/2013  . Need for prophylactic vaccination and inoculation against influenza 10/08/2013  . Generalized OA 07/23/2013  . UTI (urinary tract infection) 06/19/2013  . Hypoglycemia 06/19/2013  . Diabetes mellitus with nephropathy ([place]) 03/21/2013  . Osteoarthrosis, unspecified whether generalized or localized, unspecified site   . Other and unspecified hyperlipidemia   . Chronic kidney disease     Darrel Hoover PT 10/02/2015, 6:22 PM  [facility] [address] [place], [place], [address] Phone: [phone]   Fax:  [phone]  Name: Khaniyah Bezek MRN: [id number] Date of Birth: Sep 25, 1928    PHYSICAL THERAPY DISCHARGE SUMMARY  Visits from Start of Care: 7  Current functional level related to goals / functional outcomes: See above   Remaining deficits: AM pain and with weight bearing activity   Education / Equipment: HEP Plan: Patient agrees to discharge.  Patient goals were partially met. Patient is being discharged due to lack of progress.  ?????

## 2015-09-28 NOTE — Patient Instructions (Signed)
All HEP reviewed and she was able to do all correctly. She practiced rolling whole body and segmentally and suggested she practice this at home. No handout issued

## 2015-10-02 ENCOUNTER — Encounter: Payer: Self-pay | Admitting: *Deleted

## 2015-10-06 ENCOUNTER — Ambulatory Visit: Payer: Medicare Other | Admitting: *Deleted

## 2015-10-06 DIAGNOSIS — E1142 Type 2 diabetes mellitus with diabetic polyneuropathy: Secondary | ICD-10-CM

## 2015-10-07 ENCOUNTER — Encounter: Payer: Medicare Other | Admitting: Physical Therapy

## 2015-10-07 NOTE — Progress Notes (Signed)
Patient ID: Dawn [name], female   DOB: September 09, 1928, 81 y.o.   MRN: [id number] Patient presents to be scanned and measured for diabetic shoes and inserts.

## 2015-10-30 ENCOUNTER — Other Ambulatory Visit: Payer: Self-pay

## 2015-10-30 DIAGNOSIS — M48062 Spinal stenosis, lumbar region with neurogenic claudication: Secondary | ICD-10-CM

## 2015-10-30 MED ORDER — OXYCODONE-ACETAMINOPHEN 5-325 MG PO TABS: 1.0000 | ORAL_TABLET | Freq: Three times a day (TID) | ORAL | Status: DC | PRN

## 2015-11-17 ENCOUNTER — Other Ambulatory Visit: Payer: Self-pay | Admitting: Internal Medicine

## 2015-12-08 ENCOUNTER — Other Ambulatory Visit: Payer: Self-pay | Admitting: *Deleted

## 2015-12-08 DIAGNOSIS — M48062 Spinal stenosis, lumbar region with neurogenic claudication: Secondary | ICD-10-CM

## 2015-12-08 MED ORDER — OXYCODONE-ACETAMINOPHEN 5-325 MG PO TABS: 1.0000 | ORAL_TABLET | Freq: Three times a day (TID) | ORAL | Status: DC | PRN

## 2015-12-08 NOTE — Telephone Encounter (Signed)
Patient requested and will pick up 

## 2015-12-09 ENCOUNTER — Ambulatory Visit (INDEPENDENT_AMBULATORY_CARE_PROVIDER_SITE_OTHER): Payer: Medicare Other | Admitting: Podiatry

## 2015-12-09 ENCOUNTER — Encounter: Payer: Self-pay | Admitting: Podiatry

## 2015-12-09 DIAGNOSIS — B351 Tinea unguium: Secondary | ICD-10-CM

## 2015-12-09 DIAGNOSIS — M2041 Other hammer toe(s) (acquired), right foot: Secondary | ICD-10-CM | POA: Diagnosis not present

## 2015-12-09 DIAGNOSIS — M2042 Other hammer toe(s) (acquired), left foot: Secondary | ICD-10-CM

## 2015-12-09 DIAGNOSIS — M79609 Pain in unspecified limb: Secondary | ICD-10-CM

## 2015-12-09 DIAGNOSIS — E1142 Type 2 diabetes mellitus with diabetic polyneuropathy: Secondary | ICD-10-CM

## 2015-12-09 DIAGNOSIS — E1121 Type 2 diabetes mellitus with diabetic nephropathy: Secondary | ICD-10-CM

## 2015-12-09 NOTE — Progress Notes (Signed)
Patient ID: Dawn [name], female   DOB: 06-29-1928, 81 y.o.   MRN: [id number]  Subjective: This patient presents today complaining of painful toenails when walking wearing shoes and she is requesting nail debridement. Also patient presents for dispensing of diabetic shoes with custom insole  Objective: The toenails are elongated, brittle, incurvated, deformed and tender to direct palpation 6-10  Chart review Decrease sensation to 10 g monofilament wire Vibratory sensation nonreactive bilaterally Hammertoe deformities 2-5  Assessment: Symptomatic onychomycoses 6-10 Diabetic peripheral neuropathy Hammertoe deformities 2-5 bilaterally  Plan: Debridement toenails 6-10 mechanically electronically without any bleeding  Dispenced diabetic shoes 2 size 10.5, width B Apex A300W  Dispensed #6 custom multilaminated diabetic shoe insoles Shoes and custom insoles fit satisfactorily Wearing instruction provided for patient  Rescheduled 3 months for nail debridement or sooner if patient has concern  :

## 2015-12-09 NOTE — Patient Instructions (Signed)

## 2015-12-23 ENCOUNTER — Encounter: Payer: Self-pay | Admitting: Internal Medicine

## 2016-01-21 ENCOUNTER — Other Ambulatory Visit: Payer: Self-pay | Admitting: *Deleted

## 2016-01-21 DIAGNOSIS — M48062 Spinal stenosis, lumbar region with neurogenic claudication: Secondary | ICD-10-CM

## 2016-01-21 MED ORDER — OXYCODONE-ACETAMINOPHEN 5-325 MG PO TABS: 1.0000 | ORAL_TABLET | Freq: Three times a day (TID) | ORAL | Status: DC | PRN

## 2016-01-21 NOTE — Telephone Encounter (Signed)
Left message on patient voice mail regarding pick up of her medication.

## 2016-01-28 ENCOUNTER — Other Ambulatory Visit: Payer: Medicare Other

## 2016-01-29 ENCOUNTER — Other Ambulatory Visit: Payer: Medicare Other

## 2016-01-29 DIAGNOSIS — N183 Chronic kidney disease, stage 3 unspecified: Secondary | ICD-10-CM

## 2016-01-29 DIAGNOSIS — E1121 Type 2 diabetes mellitus with diabetic nephropathy: Secondary | ICD-10-CM

## 2016-01-29 DIAGNOSIS — E785 Hyperlipidemia, unspecified: Secondary | ICD-10-CM

## 2016-01-30 LAB — CBC WITH DIFFERENTIAL/PLATELET
Basophils Absolute: 0 10*3/uL (ref 0.0–0.2)
Basos: 1 %
EOS (ABSOLUTE): 0.3 10*3/uL (ref 0.0–0.4)
Eos: 4 %
Hematocrit: 35.2 % (ref 34.0–46.6)
Hemoglobin: 11 g/dL — ABNORMAL LOW (ref 11.1–15.9)
Immature Grans (Abs): 0 10*3/uL (ref 0.0–0.1)
Immature Granulocytes: 0 %
Lymphocytes Absolute: 2 10*3/uL (ref 0.7–3.1)
Lymphs: 26 %
MCH: 26.5 pg — ABNORMAL LOW (ref 26.6–33.0)
MCHC: 31.3 g/dL — ABNORMAL LOW (ref 31.5–35.7)
MCV: 85 fL (ref 79–97)
Monocytes Absolute: 0.6 10*3/uL (ref 0.1–0.9)
Monocytes: 8 %
Neutrophils Absolute: 4.6 10*3/uL (ref 1.4–7.0)
Neutrophils: 61 %
Platelets: 192 10*3/uL (ref 150–379)
RBC: 4.15 x10E6/uL (ref 3.77–5.28)
RDW: 15.8 % — ABNORMAL HIGH (ref 12.3–15.4)
WBC: 7.6 10*3/uL (ref 3.4–10.8)

## 2016-01-30 LAB — LIPID PANEL
Chol/HDL Ratio: 2.7 ratio units (ref 0.0–4.4)
Cholesterol, Total: 142 mg/dL (ref 100–199)
HDL: 53 mg/dL (ref >39)
LDL Calculated: 71 mg/dL (ref 0–99)
Triglycerides: 91 mg/dL (ref 0–149)
VLDL Cholesterol Cal: 18 mg/dL (ref 5–40)

## 2016-01-30 LAB — COMPREHENSIVE METABOLIC PANEL
ALT: 16 IU/L (ref 0–32)
AST: 23 IU/L (ref 0–40)
Albumin/Globulin Ratio: 1.4 (ref 1.1–2.5)
Albumin: 3.9 g/dL (ref 3.5–4.7)
Alkaline Phosphatase: 87 IU/L (ref 39–117)
BUN/Creatinine Ratio: 19 (ref 11–26)
BUN: 29 mg/dL — ABNORMAL HIGH (ref 8–27)
Bilirubin Total: 0.3 mg/dL (ref 0.0–1.2)
CO2: 23 mmol/L (ref 18–29)
Calcium: 9.4 mg/dL (ref 8.7–10.3)
Chloride: 104 mmol/L (ref 96–106)
Creatinine, Ser: 1.54 mg/dL — ABNORMAL HIGH (ref 0.57–1.00)
GFR calc Af Amer: 35 mL/min/{1.73_m2} — ABNORMAL LOW (ref >59)
GFR calc non Af Amer: 31 mL/min/{1.73_m2} — ABNORMAL LOW (ref >59)
Globulin, Total: 2.8 g/dL (ref 1.5–4.5)
Glucose: 132 mg/dL — ABNORMAL HIGH (ref 65–99)
Potassium: 4.5 mmol/L (ref 3.5–5.2)
Sodium: 144 mmol/L (ref 134–144)
Total Protein: 6.7 g/dL (ref 6.0–8.5)

## 2016-01-30 LAB — HEMOGLOBIN A1C
Est. average glucose Bld gHb Est-mCnc: 151 mg/dL
Hgb A1c MFr Bld: 6.9 % — ABNORMAL HIGH (ref 4.8–5.6)

## 2016-02-01 ENCOUNTER — Encounter: Payer: Self-pay | Admitting: Internal Medicine

## 2016-02-01 ENCOUNTER — Ambulatory Visit (INDEPENDENT_AMBULATORY_CARE_PROVIDER_SITE_OTHER): Payer: Medicare Other | Admitting: Internal Medicine

## 2016-02-01 VITALS — BP 114/68 | HR 74 | Temp 98.8°F | Ht 64.5 in | Wt 183.0 lb

## 2016-02-01 DIAGNOSIS — E1121 Type 2 diabetes mellitus with diabetic nephropathy: Secondary | ICD-10-CM | POA: Diagnosis not present

## 2016-02-01 DIAGNOSIS — Z0000 Encounter for general adult medical examination without abnormal findings: Secondary | ICD-10-CM

## 2016-02-01 DIAGNOSIS — K5901 Slow transit constipation: Secondary | ICD-10-CM | POA: Diagnosis not present

## 2016-02-01 DIAGNOSIS — N3941 Urge incontinence: Secondary | ICD-10-CM

## 2016-02-01 DIAGNOSIS — N183 Chronic kidney disease, stage 3 unspecified: Secondary | ICD-10-CM

## 2016-02-01 DIAGNOSIS — H9190 Unspecified hearing loss, unspecified ear: Secondary | ICD-10-CM

## 2016-02-01 DIAGNOSIS — I10 Essential (primary) hypertension: Secondary | ICD-10-CM

## 2016-02-01 DIAGNOSIS — H9193 Unspecified hearing loss, bilateral: Secondary | ICD-10-CM | POA: Diagnosis not present

## 2016-02-01 NOTE — Patient Instructions (Addendum)
Please bring [place] a copy of your health care power of attorney and living will for your file.  Discontinue all of your constipation medications. Drink plenty of fluids and continue to eat prunes.   Let me know if you become constipated.   I think maybe your diabetes has caused your stools to become looser over time.

## 2016-02-01 NOTE — Progress Notes (Signed)
Patient ID: Dawn [name], female   DOB: November 11, 1928, 82 y.o.   MRN: [id number] MMSE 30/30 passed clock drawing

## 2016-02-01 NOTE — Progress Notes (Signed)
Patient ID: Dawn [name], female   DOB: 1928/07/25, 82 y.o.   MRN: [id number]  [employer] office Provider: Jonelle Sidle L. Mariea Clonts, D.O., C.M.D.  Patient Care Team: Gayland Curry, DO as PCP - General (Geriatric Medicine)  Extended Emergency Contact Information Primary Emergency Contact: Sampath-Johnson,Lori Address: [place]          [place] Phone: [phone] Mobile Phone: [phone] Relation: None  Goals of Care: Advanced Directive information Advanced Directives 02/01/2016  Does patient have an advance directive? Yes  Type of Paramedic of [place];Living will  Does patient want to make changes to advanced directive? -  Copy of advanced directive(s) in chart? No - copy requested     Chief Complaint  Patient presents with  . Annual Exam    Wellness exam  . Medical Management of Chronic Issues    blood sugar, CKD, blood pressure, cholesterol  . Medication Management    Can't take Amitiza daily, takes it once a day for 2 days, then off one daiy. Cause loose stools.  Marland Kitchen MMSE    30/30 passed clock    HPI: Patient is a 82 y.o. female seen in today for an annual wellness exam.    Has had constipation even before she was started on pain medication.  Problem began after she had her cystocele surgery.  She then took stool softener colace, metamucil, miralax, linzess and now [place].  All have worked "too well".  Has had loose stools now for several days.  Had adjusted amitiza to take two days, then hold, then take again, but still having loose stools and has had accidents.  Has not gone without a constipation med of some sort since her postop period from the bladder surgery.  Says prunes and fluids help.    Pravachol has now gotten her lipids very close to goal.  Depression screen [facility] 2/9 02/01/2016 01/06/2015 10/08/2013  Decreased Interest - 0 0  Down, Depressed, Hopeless 0 0 0  PHQ - 2 Score 0 0 0    Fall Risk   02/01/2016 07/30/2015 04/17/2015 01/06/2015 07/29/2014  Falls in the past year? Yes No No Yes Yes  Number falls in past yr: 1 - - 1 1  Injury with Fall? No - - No Yes   MMSE - Mini Mental State Exam 02/01/2016 01/06/2015  Orientation to time 5 5  Orientation to Place 5 5  Registration 3 3  Attention/ Calculation 5 5  Recall 3 2  Language- name 2 objects 2 2  Language- repeat 1 1  Language- follow 3 step command 3 3  Language- read & follow direction 1 1  Write a sentence 1 1  Copy design 1 1  Total score 30 29     Health Maintenance  Topic Date Due  . INFLUENZA VACCINE  07/13/2015  . FOOT EXAM  01/07/2016  . OPHTHALMOLOGY EXAM  03/18/2016  . HEMOGLOBIN A1C  07/28/2016  . TETANUS/TDAP  03/12/2024  . DEXA SCAN  Completed  . ZOSTAVAX  Completed  . PNA vac Low Risk Adult  Completed   Urinary incontinence?  Sometimes if she waits too long, she can't make it in time.    Functional Status Survey: Is the patient deaf or have difficulty hearing?: No Does the patient have difficulty seeing, even when wearing glasses/contacts?: Yes (see eye exam) Does the patient have difficulty concentrating, remembering, or making decisions?: No Does the patient have difficulty walking or climbing stairs?: Yes (  uses handrail to walk up stairs.  uses cart to push in grocery store.  can walk short distances.  does use cane for long walks) Does the patient have difficulty dressing or bathing?: Yes Does the patient have difficulty doing errands alone such as visiting a doctor's office or shopping?: No Exercise?  Does her PT exercises--daily.   Diet?  Doing pretty well--does watch her sweets and starchy foods.  Visual Acuity Screening   Right eye Left eye Both eyes  Without correction:     With correction: 20/50 20/50 20/40   Hearing:  Needs hearing test--failed something at [facility] Dentition: has three of her own teeth, otherwise partial on floor and full upper denture.  No difficulty with chewing or  swallowing. Pain:  Left leg gets painful in the morning.  Improves as she walks along.  Had been to [facility] therapy.    Past Medical History  Diagnosis Date  . Unspecified constipation   . Chronic kidney disease, stage III (moderate)   . Disorder of bone and cartilage, unspecified   . Other hammer toe (acquired)   . Anorexia   . Postmenopausal atrophic vaginitis   . Anemia, unspecified   . Reflux esophagitis   . Unspecified disorder of kidney and ureter   . Osteoarthrosis, unspecified whether generalized or localized, unspecified site   . Other and unspecified hyperlipidemia   . Cystocele, midline   . Unspecified essential hypertension   . Type II or unspecified type diabetes mellitus without mention of complication, not stated as uncontrolled   . Unspecified urinary incontinence   . Unspecified hereditary and idiopathic peripheral neuropathy   . Obesity, unspecified   . Kyphosis associated with other condition   . Chronic kidney disease   . PVC's (premature ventricular contractions) 01/29/2015  . RBBB 01/29/2015    Past Surgical History  Procedure Laterality Date  . Bladder surgery      tie up in 2013  . Tubal ligation      Social History   Social History  . Marital Status: Widowed    Spouse Name: N/A  . Number of Children: N/A  . Years of Education: N/A   Occupational History  . retired Occupational psychologist    Social History Main Topics  . Smoking status: Former Smoker    Quit date: 12/12/1968  . Smokeless tobacco: Never Used     Comment: quit in 1970  . Alcohol Use: No  . Drug Use: No  . Sexual Activity: No   Other Topics Concern  . Not on file   Social History Narrative   Widow - husband died 37   Lives one story house -son and grandson lives with her   Former smoker -stopped 1970   Alcohol none   Exercise -some walking   POA, Living Will       Allergies  Allergen Reactions  . Keflex [Cephalexin]   . Metronidazole   . Simvastatin     Leg  cramsps      Medication List       This list is accurate as of: 02/01/16  2:46 PM.  Always use your most recent med list.               AMBULATORY NON FORMULARY MEDICATION  wheelchair     aspirin 81 MG tablet  Take 81 mg by mouth daily.     calcium-vitamin D 500-200 MG-UNIT tablet  Commonly known as:  OSCAL WITH D  Take 1 tablet by mouth  daily.     cholecalciferol 1000 units tablet  Commonly known as:  VITAMIN D  Take 1,000 Units by mouth daily.     desoximetasone 0.05 % cream  Commonly known as:  TOPICORT  Apply topically 2 (two) times daily.     gabapentin 300 MG capsule  Commonly known as:  NEURONTIN  Take one tablet by mouth in the morning and one tablet by mouth in the evening for pains     glipiZIDE 5 MG tablet  Commonly known as:  GLUCOTROL  TAKE 1 TABLET DAILY (AS OF 05/07/14)     linagliptin 5 MG Tabs tablet  Commonly known as:  TRADJENTA  Take 1 tablet (5 mg total) by mouth daily.     lisinopril-hydrochlorothiazide 20-12.5 MG tablet  Commonly known as:  PRINZIDE,ZESTORETIC  TAKE 1 TABLET DAILY TO CONTROL BLOOD PRESSURE     oxyCODONE-acetaminophen 5-325 MG tablet  Commonly known as:  ROXICET  Take 1 tablet by mouth every 8 (eight) hours as needed for severe pain.     pravastatin 20 MG tablet  Commonly known as:  PRAVACHOL  Take 1 tablet ( 20 mg ) by mouth with evening meal     vitamin E 400 UNIT capsule  Take 400 Units by mouth daily.         Review of Systems:  Review of Systems  Constitutional: Negative for activity change, appetite change and unexpected weight change.  HENT: Negative for congestion.   Eyes: Negative for pain and visual disturbance.  Respiratory: Negative for shortness of breath.   Cardiovascular: Negative for chest pain, palpitations and leg swelling.  Gastrointestinal: Positive for diarrhea. Negative for nausea, vomiting, abdominal pain, constipation, blood in stool, abdominal distention and rectal pain.       With  linzess, amitiza  Genitourinary: Negative for difficulty urinating.  Musculoskeletal: Positive for arthralgias.  Skin: Negative for color change.  Neurological: Negative for dizziness and weakness.  Psychiatric/Behavioral: Negative for confusion and agitation.    Physical Exam: Filed Vitals:   02/01/16 1354  BP: 114/68  Pulse: 74  Temp: 98.8 F (37.1 C)  TempSrc: Oral  Height: 5' 4.5" (1.638 m)  Weight: 183 lb (83.008 kg)  SpO2: 97%   Body mass index is 30.94 kg/(m^2). Physical Exam  Constitutional: She is oriented to person, place, and time. She appears well-developed and well-nourished. No distress.  HENT:  Head: Normocephalic and atraumatic.  Right Ear: External ear normal.  Left Ear: External ear normal.  Nose: Nose normal.  Mouth/Throat: Oropharynx is clear and moist.  Eyes: Conjunctivae and EOM are normal. Pupils are equal, round, and reactive to light. No scleral icterus.  Neck: Normal range of motion. Neck supple. No JVD present. No tracheal deviation present. No thyromegaly present.  Cardiovascular: Normal rate, regular rhythm and intact distal pulses.  Exam reveals no gallop and no friction rub.   No murmur heard. Pulmonary/Chest: Effort normal and breath sounds normal. No respiratory distress.  Abdominal: Soft. Bowel sounds are normal. She exhibits no distension.  Musculoskeletal: Normal range of motion. She exhibits no edema or tenderness.  Lymphadenopathy:    She has no cervical adenopathy.  Neurological: She is alert and oriented to person, place, and time. She has normal reflexes. No cranial nerve deficit. Coordination normal.  Skin: Skin is warm and dry.  Psychiatric: She has a normal mood and affect. Her behavior is normal. Judgment and thought content normal.    Labs reviewed: Basic Metabolic Panel:  Recent Labs  07/20/15  [id number] 01/29/16 0955  NA 143 144  K 4.5 4.5  CL 104 104  CO2 24 23  GLUCOSE 138* 132*  BUN 27 29*  CREATININE 1.56* 1.54*   CALCIUM 9.2 9.4   Liver Function Tests:  Recent Labs  07/20/15 0918 01/29/16 0955  AST 20 23  ALT 14 16  ALKPHOS 102 87  BILITOT 0.3 0.3  PROT 6.6 6.7  ALBUMIN 3.8 3.9   No results for input(s): LIPASE, AMYLASE in the last 8760 hours. No results for input(s): AMMONIA in the last 8760 hours. CBC:  Recent Labs  07/20/15 0918 01/29/16 0955  WBC 7.0 7.6  NEUTROABS 4.2 4.6  HCT 34.6 35.2  MCV 86 85  PLT 202 192   Lipid Panel:  Recent Labs  07/20/15 0918 01/29/16 0955  CHOL 191 142  HDL 45 53  LDLCALC 121* 71  TRIG 123 91  CHOLHDL 4.2 2.7   Lab Results  Component Value Date   HGBA1C 6.9* 01/29/2016    Assessment/Plan 1. Essential hypertension, benign -bp is at goal with current medication lisinopril/hctz - EKG 12-Lead was NSR at75bpm, unchanged from prior EKG on file - Basic metabolic panel; Future  2. Urge incontinence of urine -ongoing, but mild, not on medication -discussed kegels and avoiding fluids for a few hrs before bed -she is to let me know if she wants to consider any medication  3. Slow transit constipation -d/c amitiza, increase fluids in daytime, continue to eat prunes -let me know if constipation returns off meds -suspect maybe diabetes and some pancreatic insufficiency has caused stools to get looser over time  4. Type 2 diabetes mellitus with diabetic nephropathy, without long-term current use of insulin (HCC) - cont glipizide and tradjenta, no lows noted; also tries to eat healthy diet and get some exercise - Hemoglobin A1c; Future - Lipid panel; Future - Basic metabolic panel; Future - Microalbumin/Creatinine Ratio, Urine  5. Chronic kidney disease, stage 3 (moderate) -cont to avoid nsaids and hydrate well -renally dose meds - Basic metabolic panel; Future  6. Medicare annual wellness visit, subsequent -see hpi  7. Hearing impairment, bilateral - requests hearing evaluation - Ambulatory referral to Audiology  Labs/tests  ordered:   Orders Placed This Encounter  Procedures  . Hemoglobin A1c    Standing Status: Future     Number of Occurrences:      Standing Expiration Date: 07/31/2016  . Lipid panel    Standing Status: Future     Number of Occurrences:      Standing Expiration Date: 07/31/2016    Order Specific Question:  Has the patient fasted?    Answer:  Yes  . Basic metabolic panel    Standing Status: Future     Number of Occurrences:      Standing Expiration Date: 07/31/2016    Order Specific Question:  Has the patient fasted?    Answer:  Yes  . Microalbumin/Creatinine Ratio, Urine  . Ambulatory referral to Audiology    Referral Priority:  Routine    Referral Type:  Audiology Exam    Referral Reason:  Specialty Services Required    Requested Specialty:  Audiology    Number of Visits Requested:  1  . EKG 12-Lead    Next appt:  06/02/2016 med mgt   Jsiah Menta L. Zury Fazzino, D.O. [facility] [address], [place] [address] Cell Phone (Mon-Fri 8am-5pm):  [phone] On Call:  [phone] & follow prompts after 5pm &  weekends Office Phone:  [phone] Office Fax:  [phone]

## 2016-02-02 LAB — MICROALBUMIN / CREATININE URINE RATIO
Creatinine, Urine: 64.7 mg/dL
MICROALB/CREAT RATIO: 9.9 mg/g creat (ref 0.0–30.0)
Microalbumin, Urine: 6.4 ug/mL

## 2016-02-08 ENCOUNTER — Encounter: Payer: Self-pay | Admitting: Internal Medicine

## 2016-02-09 ENCOUNTER — Telehealth: Payer: Self-pay

## 2016-02-09 NOTE — Telephone Encounter (Signed)
Left message for patient to call the office in regards to note left by Dr. Mariea Clonts about glucose levels.

## 2016-03-10 ENCOUNTER — Other Ambulatory Visit: Payer: Self-pay

## 2016-03-10 DIAGNOSIS — M48062 Spinal stenosis, lumbar region with neurogenic claudication: Secondary | ICD-10-CM

## 2016-03-10 MED ORDER — OXYCODONE-ACETAMINOPHEN 5-325 MG PO TABS
1.0000 | ORAL_TABLET | Freq: Three times a day (TID) | ORAL | Status: DC | PRN
Start: 2016-03-10 — End: 2016-04-26

## 2016-03-10 NOTE — Telephone Encounter (Signed)
Patient called to ask for refill on oxycodone it was due so I filled script and called to tell patient she could pick up script in office

## 2016-03-16 ENCOUNTER — Encounter: Payer: Self-pay | Admitting: Podiatry

## 2016-03-16 ENCOUNTER — Ambulatory Visit (INDEPENDENT_AMBULATORY_CARE_PROVIDER_SITE_OTHER): Payer: Medicare Other | Admitting: Podiatry

## 2016-03-16 DIAGNOSIS — M79609 Pain in unspecified limb: Secondary | ICD-10-CM

## 2016-03-16 DIAGNOSIS — M79673 Pain in unspecified foot: Secondary | ICD-10-CM

## 2016-03-16 DIAGNOSIS — B351 Tinea unguium: Secondary | ICD-10-CM

## 2016-03-16 NOTE — Patient Instructions (Signed)
Diabetes and Foot Care Diabetes may cause you to have problems because of poor blood supply (circulation) to your feet and legs. This may cause the skin on your feet to become thinner, break easier, and heal more slowly. Your skin may become dry, and the skin may peel and crack. You may also have nerve damage in your legs and feet causing decreased feeling in them. You may not notice minor injuries to your feet that could lead to infections or more serious problems. Taking care of your feet is one of the most important things you can do for yourself.  HOME CARE INSTRUCTIONS  Wear shoes at all times, even in the house. Do not go barefoot. Bare feet are easily injured.  Check your feet daily for blisters, cuts, and redness. If you cannot see the bottom of your feet, use a mirror or ask someone for help.  Wash your feet with warm water (do not use hot water) and mild soap. Then pat your feet and the areas between your toes until they are completely dry. Do not soak your feet as this can dry your skin.  Apply a moisturizing lotion or petroleum jelly (that does not contain alcohol and is unscented) to the skin on your feet and to dry, brittle toenails. Do not apply lotion between your toes.  Trim your toenails straight across. Do not dig under them or around the cuticle. File the edges of your nails with an emery board or nail file.  Do not cut corns or calluses or try to remove them with medicine.  Wear clean socks or stockings every day. Make sure they are not too tight. Do not wear knee-high stockings since they may decrease blood flow to your legs.  Wear shoes that fit properly and have enough cushioning. To break in new shoes, wear them for just a few hours a day. This prevents you from injuring your feet. Always look in your shoes before you put them on to be sure there are no objects inside.  Do not cross your legs. This may decrease the blood flow to your feet.  If you find a minor scrape,  cut, or break in the skin on your feet, keep it and the skin around it clean and dry. These areas may be cleansed with mild soap and water. Do not cleanse the area with peroxide, alcohol, or iodine.  When you remove an adhesive bandage, be sure not to damage the skin around it.  If you have a wound, look at it several times a day to make sure it is healing.  Do not use heating pads or hot water bottles. They may burn your skin. If you have lost feeling in your feet or legs, you may not know it is happening until it is too late.  Make sure your health care provider performs a complete foot exam at least annually or more often if you have foot problems. Report any cuts, sores, or bruises to your health care provider immediately. SEEK MEDICAL CARE IF:   You have an injury that is not healing.  You have cuts or breaks in the skin.  You have an ingrown nail.  You notice redness on your legs or feet.  You feel burning or tingling in your legs or feet.  You have pain or cramps in your legs and feet.  Your legs or feet are numb.  Your feet always feel cold. SEEK IMMEDIATE MEDICAL CARE IF:   There is increasing redness,   swelling, or pain in or around a wound.  There is a red line that goes up your leg.  Pus is coming from a wound.  You develop a fever or as directed by your health care provider.  You notice a bad smell coming from an ulcer or wound.   This information is not intended to replace advice given to you by your health care provider. Make sure you discuss any questions you have with your health care provider.   Document Released: 11/25/2000 Document Revised: 07/31/2013 Document Reviewed: 05/07/2013 Elsevier Interactive Patient Education 2016 [employer]  

## 2016-03-17 NOTE — Progress Notes (Signed)
Patient ID: Dawn [name], female   DOB: 05/10/1928, 82 y.o.   MRN: [id number]  Subjective: This patient presents today complaining of painful toenails when walking wearing shoes and she is requesting nail debridement. Also patient presents for dispensing of diabetic shoes with custom insole  Objective: The toenails are elongated, brittle, incurvated, deformed and tender to direct palpation 6-10  Chart review Decrease sensation to 10 g monofilament wire Vibratory sensation nonreactive bilaterally Hammertoe deformities 2-5  Assessment: Symptomatic onychomycoses 6-10 Diabetic peripheral neuropathy Hammertoe deformities 2-5 bilaterally  Plan: Debridement toenails 6-10 mechanically electronically without any bleeding   Reappoint 3 months

## 2016-03-22 LAB — HM DIABETES EYE EXAM

## 2016-03-24 ENCOUNTER — Encounter: Payer: Self-pay | Admitting: *Deleted

## 2016-04-26 ENCOUNTER — Other Ambulatory Visit: Payer: Self-pay | Admitting: *Deleted

## 2016-04-26 DIAGNOSIS — M48062 Spinal stenosis, lumbar region with neurogenic claudication: Secondary | ICD-10-CM

## 2016-04-26 MED ORDER — OXYCODONE-ACETAMINOPHEN 5-325 MG PO TABS: 1.0000 | ORAL_TABLET | Freq: Three times a day (TID) | ORAL | Status: DC | PRN

## 2016-04-26 NOTE — Telephone Encounter (Signed)
Patient requested and will pick up tomorrow.

## 2016-05-30 ENCOUNTER — Other Ambulatory Visit: Payer: Medicare Other

## 2016-05-30 DIAGNOSIS — I10 Essential (primary) hypertension: Secondary | ICD-10-CM

## 2016-05-30 DIAGNOSIS — N183 Chronic kidney disease, stage 3 unspecified: Secondary | ICD-10-CM

## 2016-05-30 DIAGNOSIS — E1121 Type 2 diabetes mellitus with diabetic nephropathy: Secondary | ICD-10-CM

## 2016-05-31 LAB — LIPID PANEL
Chol/HDL Ratio: 2.8 ratio units (ref 0.0–4.4)
Cholesterol, Total: 131 mg/dL (ref 100–199)
HDL: 46 mg/dL (ref >39)
LDL Calculated: 59 mg/dL (ref 0–99)
Triglycerides: 129 mg/dL (ref 0–149)
VLDL Cholesterol Cal: 26 mg/dL (ref 5–40)

## 2016-05-31 LAB — BASIC METABOLIC PANEL
BUN/Creatinine Ratio: 16 (ref 12–28)
BUN: 24 mg/dL (ref 8–27)
CO2: 23 mmol/L (ref 18–29)
Calcium: 9.5 mg/dL (ref 8.7–10.3)
Chloride: 104 mmol/L (ref 96–106)
Creatinine, Ser: 1.54 mg/dL — ABNORMAL HIGH (ref 0.57–1.00)
GFR calc Af Amer: 35 mL/min/{1.73_m2} — ABNORMAL LOW (ref >59)
GFR calc non Af Amer: 30 mL/min/{1.73_m2} — ABNORMAL LOW (ref >59)
Glucose: 123 mg/dL — ABNORMAL HIGH (ref 65–99)
Potassium: 4.6 mmol/L (ref 3.5–5.2)
Sodium: 142 mmol/L (ref 134–144)

## 2016-05-31 LAB — HEMOGLOBIN A1C
Est. average glucose Bld gHb Est-mCnc: 143 mg/dL
Hgb A1c MFr Bld: 6.6 % — ABNORMAL HIGH (ref 4.8–5.6)

## 2016-06-02 ENCOUNTER — Ambulatory Visit (INDEPENDENT_AMBULATORY_CARE_PROVIDER_SITE_OTHER): Payer: Medicare Other | Admitting: Internal Medicine

## 2016-06-02 ENCOUNTER — Encounter: Payer: Self-pay | Admitting: Internal Medicine

## 2016-06-02 ENCOUNTER — Encounter: Payer: Self-pay | Admitting: *Deleted

## 2016-06-02 VITALS — BP 120/70 | HR 72 | Wt 181.0 lb

## 2016-06-02 DIAGNOSIS — N183 Chronic kidney disease, stage 3 unspecified: Secondary | ICD-10-CM

## 2016-06-02 DIAGNOSIS — K5901 Slow transit constipation: Secondary | ICD-10-CM

## 2016-06-02 DIAGNOSIS — M48062 Spinal stenosis, lumbar region with neurogenic claudication: Secondary | ICD-10-CM

## 2016-06-02 DIAGNOSIS — M4806 Spinal stenosis, lumbar region: Secondary | ICD-10-CM

## 2016-06-02 DIAGNOSIS — H9193 Unspecified hearing loss, bilateral: Secondary | ICD-10-CM | POA: Diagnosis not present

## 2016-06-02 DIAGNOSIS — I10 Essential (primary) hypertension: Secondary | ICD-10-CM

## 2016-06-02 DIAGNOSIS — H269 Unspecified cataract: Secondary | ICD-10-CM

## 2016-06-02 DIAGNOSIS — E1121 Type 2 diabetes mellitus with diabetic nephropathy: Secondary | ICD-10-CM

## 2016-06-02 NOTE — Progress Notes (Signed)
Location:  [facility] clinic Provider:  Keeva Reisen L. Mariea Clonts, D.O., C.M.D.  Goals of Care:  Advanced Directives 06/02/2016  Does patient have an advance directive? No  Type of Advance Directive -  Does patient want to make changes to advanced directive? -  Copy of advanced directive(s) in chart? -   Chief Complaint  Patient presents with  . Medical Management of Chronic Issues    4 mth follow-up    HPI: Patient is a 82 y.o. female seen today for medical management of chronic diseases.    If she sleeps on her right side, she does not wake up with pain in her left leg.  Otherwise she can hardly walk.    Saw Dr. Katy Fitch 03/22/16.  No retinopathy, but does have some cataracts.  Has not decided she is ready to have them removed just yet.  Still trying to make up her mind.   May do it next summer when her granddaughter can help with the eyedrops afterwards.  02/15/16 had hearing eval.  Hearing aides would be over $3000.  Is eligible for a discount rate for her hearing aides through the [employer].  Not accepting new pts due to the president putting everything on hold.    Blood pressure was good today.  No dizziness or lightheadedness.    Past Medical History  Diagnosis Date  . Unspecified constipation   . Chronic kidney disease, stage III (moderate)   . Disorder of bone and cartilage, unspecified   . Other hammer toe (acquired)   . Anorexia   . Postmenopausal atrophic vaginitis   . Anemia, unspecified   . Reflux esophagitis   . Unspecified disorder of kidney and ureter   . Osteoarthrosis, unspecified whether generalized or localized, unspecified site   . Other and unspecified hyperlipidemia   . Cystocele, midline   . Unspecified essential hypertension   . Type II or unspecified type diabetes mellitus without mention of complication, not stated as uncontrolled   . Unspecified urinary incontinence   . Unspecified hereditary and idiopathic peripheral neuropathy   . Obesity, unspecified   .  Kyphosis associated with other condition   . Chronic kidney disease   . PVC's (premature ventricular contractions) 01/29/2015  . RBBB 01/29/2015    Past Surgical History  Procedure Laterality Date  . Bladder surgery      tie up in 2013  . Tubal ligation      Allergies  Allergen Reactions  . Keflex [Cephalexin]   . Metronidazole   . Simvastatin     Leg cramsps      Medication List       This list is accurate as of: 06/02/16  2:57 PM.  Always use your most recent med list.               aspirin 81 MG tablet  Take 81 mg by mouth daily.     calcium-vitamin D 500-200 MG-UNIT tablet  Commonly known as:  OSCAL WITH D  Take 1 tablet by mouth daily.     cholecalciferol 1000 units tablet  Commonly known as:  VITAMIN D  Take 1,000 Units by mouth daily.     desoximetasone 0.05 % cream  Commonly known as:  TOPICORT  Apply topically 2 (two) times daily.     gabapentin 300 MG capsule  Commonly known as:  NEURONTIN  Take one tablet by mouth in the morning and one tablet by mouth in the evening for pains     glipiZIDE 5  MG tablet  Commonly known as:  GLUCOTROL  TAKE 1 TABLET DAILY (AS OF 05/07/14)     linagliptin 5 MG Tabs tablet  Commonly known as:  TRADJENTA  Take 1 tablet (5 mg total) by mouth daily.     lisinopril-hydrochlorothiazide 20-12.5 MG tablet  Commonly known as:  PRINZIDE,ZESTORETIC  TAKE 1 TABLET DAILY TO CONTROL BLOOD PRESSURE     oxyCODONE-acetaminophen 5-325 MG tablet  Commonly known as:  ROXICET  Take 1 tablet by mouth every 8 (eight) hours as needed for severe pain.     pravastatin 20 MG tablet  Commonly known as:  PRAVACHOL  Take 1 tablet ( 20 mg ) by mouth with evening meal     vitamin E 400 UNIT capsule  Take 400 Units by mouth daily.        Review of Systems:  Review of Systems  Constitutional: Negative for fever, chills and malaise/fatigue.  HENT: Positive for hearing loss. Negative for congestion.   Eyes: Negative for blurred vision.   Respiratory: Negative for shortness of breath.   Cardiovascular: Negative for chest pain and leg swelling.  Gastrointestinal: Negative for abdominal pain, diarrhea, constipation, blood in stool and melena.       Bowels improved  Genitourinary: Negative for dysuria.  Musculoskeletal: Positive for myalgias and back pain. Negative for falls.       Left leg  Skin: Negative for rash.  Neurological: Positive for tingling and sensory change. Negative for dizziness, loss of consciousness and weakness.  Endo/Heme/Allergies: Does not bruise/bleed easily.  Psychiatric/Behavioral: Negative for depression and memory loss.    Health Maintenance  Topic Date Due  . FOOT EXAM  01/07/2016  . INFLUENZA VACCINE  07/12/2016  . HEMOGLOBIN A1C  11/29/2016  . OPHTHALMOLOGY EXAM  03/22/2017  . TETANUS/TDAP  03/12/2024  . DEXA SCAN  Completed  . ZOSTAVAX  Completed  . PNA vac Low Risk Adult  Completed    Physical Exam: Filed Vitals:   06/02/16 1449  BP: 120/70  Pulse: 72  Weight: 181 lb (82.101 kg)  SpO2: 97%   Body mass index is 30.6 kg/(m^2). Physical Exam  Constitutional: She is oriented to person, place, and time. She appears well-developed and well-nourished. No distress.  Cardiovascular: Normal rate, regular rhythm, normal heart sounds and intact distal pulses.   Pulmonary/Chest: Effort normal and breath sounds normal. No respiratory distress.  Musculoskeletal: Normal range of motion.  Tender sacroiliac area into left thigh  Neurological: She is alert and oriented to person, place, and time.  Skin: Skin is warm and dry. She is not diaphoretic.  Psychiatric: She has a normal mood and affect.    Labs reviewed: Basic Metabolic Panel:  Recent Labs  07/20/15 0918 01/29/16 0955 05/30/16 1009  NA 143 144 142  K 4.5 4.5 4.6  CL 104 104 104  CO2 24 23 23   GLUCOSE 138* 132* 123*  BUN 27 29* 24  CREATININE 1.56* 1.54* 1.54*  CALCIUM 9.2 9.4 9.5   Liver Function Tests:  Recent  Labs  07/20/15 0918 01/29/16 0955  AST 20 23  ALT 14 16  ALKPHOS 102 87  BILITOT 0.3 0.3  PROT 6.6 6.7  ALBUMIN 3.8 3.9   No results for input(s): LIPASE, AMYLASE in the last 8760 hours. No results for input(s): AMMONIA in the last 8760 hours. CBC:  Recent Labs  07/20/15 0918 01/29/16 0955  WBC 7.0 7.6  NEUTROABS 4.2 4.6  HCT 34.6 35.2  MCV 86 85  PLT 202  192   Lipid Panel:  Recent Labs  07/20/15 0918 01/29/16 0955 05/30/16 1009  CHOL 191 142 131  HDL 45 53 46  LDLCALC 121* 71 59  TRIG 123 91 129  CHOLHDL 4.2 2.7 2.8   Lab Results  Component Value Date   HGBA1C 6.6* 05/30/2016   Assessment/Plan 1. Spinal stenosis, lumbar region, with neurogenic claudication -has discovered a sleeping position that makes this pain much less bothersome in the daytime so cont this, tylenol, gabapentin and percocet as needed (usually 2 per day)  2. Type 2 diabetes mellitus with diabetic nephropathy, without long-term current use of insulin (HCC) - sugar average is well controlled, cont ace, statin, tradjenta, glipizide (no lows known), baby asa - Basic metabolic panel; Future - Hemoglobin A1c; Future  3. Chronic kidney disease, stage 3 (moderate) -cont to avoid nsaids and nephrotoxic meds, encourage hydration  4. Essential hypertension, benign -bp well controlled on lisinopril hctz  5. Slow transit constipation -cont hydration and higher fiber diet--has been better lately  6. Cataracts, bilateral -needs these removed, but has not arranged just yet  7. Hearing impairment, bilateral -[facility]   Labs/tests ordered:   Orders Placed This Encounter  Procedures  . Basic metabolic panel    Standing Status: Future     Number of Occurrences:      Standing Expiration Date: 02/02/2017  . Hemoglobin A1c    Standing Status: Future     Number of Occurrences:      Standing Expiration Date: 02/02/2017    Next appt:   10/03/2016 med mgt, labs before  Leevi Cullars L. Zyona Pettaway, D.O. [facility] [address], [place] [address] Cell Phone (Mon-Fri 8am-5pm):  [phone] On Call:  [phone] & follow prompts after 5pm & weekends Office Phone:  [phone] Office Fax:  [phone]

## 2016-06-08 ENCOUNTER — Other Ambulatory Visit: Payer: Self-pay

## 2016-06-08 DIAGNOSIS — M48062 Spinal stenosis, lumbar region with neurogenic claudication: Secondary | ICD-10-CM

## 2016-06-08 MED ORDER — OXYCODONE-ACETAMINOPHEN 5-325 MG PO TABS: 1.0000 | ORAL_TABLET | Freq: Three times a day (TID) | ORAL | Status: DC | PRN

## 2016-06-22 ENCOUNTER — Encounter: Payer: Self-pay | Admitting: Podiatry

## 2016-06-22 ENCOUNTER — Ambulatory Visit (INDEPENDENT_AMBULATORY_CARE_PROVIDER_SITE_OTHER): Payer: Medicare Other | Admitting: Podiatry

## 2016-06-22 DIAGNOSIS — B351 Tinea unguium: Secondary | ICD-10-CM

## 2016-06-22 DIAGNOSIS — M79609 Pain in unspecified limb: Secondary | ICD-10-CM

## 2016-06-22 DIAGNOSIS — E1142 Type 2 diabetes mellitus with diabetic polyneuropathy: Secondary | ICD-10-CM

## 2016-06-22 DIAGNOSIS — M79676 Pain in unspecified toe(s): Secondary | ICD-10-CM | POA: Diagnosis not present

## 2016-06-22 NOTE — Patient Instructions (Signed)
Diabetes and Foot Care Diabetes may cause you to have problems because of poor blood supply (circulation) to your feet and legs. This may cause the skin on your feet to become thinner, break easier, and heal more slowly. Your skin may become dry, and the skin may peel and crack. You may also have nerve damage in your legs and feet causing decreased feeling in them. You may not notice minor injuries to your feet that could lead to infections or more serious problems. Taking care of your feet is one of the most important things you can do for yourself.  HOME CARE INSTRUCTIONS  Wear shoes at all times, even in the house. Do not go barefoot. Bare feet are easily injured.  Check your feet daily for blisters, cuts, and redness. If you cannot see the bottom of your feet, use a mirror or ask someone for help.  Wash your feet with warm water (do not use hot water) and mild soap. Then pat your feet and the areas between your toes until they are completely dry. Do not soak your feet as this can dry your skin.  Apply a moisturizing lotion or petroleum jelly (that does not contain alcohol and is unscented) to the skin on your feet and to dry, brittle toenails. Do not apply lotion between your toes.  Trim your toenails straight across. Do not dig under them or around the cuticle. File the edges of your nails with an emery board or nail file.  Do not cut corns or calluses or try to remove them with medicine.  Wear clean socks or stockings every day. Make sure they are not too tight. Do not wear knee-high stockings since they may decrease blood flow to your legs.  Wear shoes that fit properly and have enough cushioning. To break in new shoes, wear them for just a few hours a day. This prevents you from injuring your feet. Always look in your shoes before you put them on to be sure there are no objects inside.  Do not cross your legs. This may decrease the blood flow to your feet.  If you find a minor scrape,  cut, or break in the skin on your feet, keep it and the skin around it clean and dry. These areas may be cleansed with mild soap and water. Do not cleanse the area with peroxide, alcohol, or iodine.  When you remove an adhesive bandage, be sure not to damage the skin around it.  If you have a wound, look at it several times a day to make sure it is healing.  Do not use heating pads or hot water bottles. They may burn your skin. If you have lost feeling in your feet or legs, you may not know it is happening until it is too late.  Make sure your health care provider performs a complete foot exam at least annually or more often if you have foot problems. Report any cuts, sores, or bruises to your health care provider immediately. SEEK MEDICAL CARE IF:   You have an injury that is not healing.  You have cuts or breaks in the skin.  You have an ingrown nail.  You notice redness on your legs or feet.  You feel burning or tingling in your legs or feet.  You have pain or cramps in your legs and feet.  Your legs or feet are numb.  Your feet always feel cold. SEEK IMMEDIATE MEDICAL CARE IF:   There is increasing redness,   swelling, or pain in or around a wound.  There is a red line that goes up your leg.  Pus is coming from a wound.  You develop a fever or as directed by your health care provider.  You notice a bad smell coming from an ulcer or wound.   This information is not intended to replace advice given to you by your health care provider. Make sure you discuss any questions you have with your health care provider.   Document Released: 11/25/2000 Document Revised: 07/31/2013 Document Reviewed: 05/07/2013 Elsevier Interactive Patient Education 2016 [employer]  

## 2016-06-23 NOTE — Progress Notes (Signed)
Patient ID: Dawn [name], female   DOB: 03/25/1928, 82 y.o.   MRN: [id number]  Subjective: This patient presents today complaining of painful toenails when walking wearing shoes and she is requesting nail debridement. Also patient presents for dispensing of diabetic shoes with custom insole  Objective: The toenails are elongated, brittle, incurvated, deformed and tender to direct palpation 6-10  Chart review Decrease sensation to 10 g monofilament wire Vibratory sensation nonreactive bilaterally Hammertoe deformities 2-5  Assessment: Symptomatic onychomycoses 6-10 Diabetic peripheral neuropathy Hammertoe deformities 2-5 bilaterally  Plan: Debridement toenails 6-10 mechanically electronically without any bleeding   Reappoint 3 months

## 2016-07-05 ENCOUNTER — Other Ambulatory Visit: Payer: Self-pay | Admitting: *Deleted

## 2016-07-05 MED ORDER — GABAPENTIN 300 MG PO CAPS: ORAL_CAPSULE | ORAL | 3 refills | Status: DC

## 2016-07-05 NOTE — Telephone Encounter (Signed)
Patient requested Rx to be faxed to [facility]

## 2016-07-22 ENCOUNTER — Other Ambulatory Visit: Payer: Self-pay | Admitting: *Deleted

## 2016-07-22 DIAGNOSIS — M48062 Spinal stenosis, lumbar region with neurogenic claudication: Secondary | ICD-10-CM

## 2016-07-22 MED ORDER — OXYCODONE-ACETAMINOPHEN 5-325 MG PO TABS: 1.0000 | ORAL_TABLET | Freq: Three times a day (TID) | ORAL | 0 refills | Status: DC | PRN

## 2016-07-22 NOTE — Telephone Encounter (Signed)
Spoke with patient and advised rx ready for pick-up and it will be at the front desk.  

## 2016-07-22 NOTE — Telephone Encounter (Signed)
Patient requested and will pick up 

## 2016-08-04 ENCOUNTER — Other Ambulatory Visit: Payer: Self-pay

## 2016-08-04 MED ORDER — LISINOPRIL-HYDROCHLOROTHIAZIDE 20-12.5 MG PO TABS: ORAL_TABLET | ORAL | 3 refills | Status: DC

## 2016-09-05 ENCOUNTER — Other Ambulatory Visit: Payer: Self-pay | Admitting: Internal Medicine

## 2016-09-05 ENCOUNTER — Other Ambulatory Visit: Payer: Self-pay | Admitting: *Deleted

## 2016-09-05 DIAGNOSIS — E1121 Type 2 diabetes mellitus with diabetic nephropathy: Secondary | ICD-10-CM

## 2016-09-05 MED ORDER — DESOXIMETASONE 0.05 % EX CREA: TOPICAL_CREAM | Freq: Two times a day (BID) | CUTANEOUS | 3 refills | Status: DC

## 2016-09-05 MED ORDER — PRAVASTATIN SODIUM 20 MG PO TABS: ORAL_TABLET | ORAL | 3 refills | Status: DC

## 2016-09-05 NOTE — Telephone Encounter (Signed)
[facility]

## 2016-09-06 ENCOUNTER — Other Ambulatory Visit: Payer: Self-pay | Admitting: *Deleted

## 2016-09-06 DIAGNOSIS — M48062 Spinal stenosis, lumbar region with neurogenic claudication: Secondary | ICD-10-CM

## 2016-09-06 MED ORDER — OXYCODONE-ACETAMINOPHEN 5-325 MG PO TABS: 1.0000 | ORAL_TABLET | Freq: Three times a day (TID) | ORAL | 0 refills | Status: DC | PRN

## 2016-09-06 NOTE — Telephone Encounter (Signed)
Patient requested and will pick up 

## 2016-09-28 ENCOUNTER — Ambulatory Visit (INDEPENDENT_AMBULATORY_CARE_PROVIDER_SITE_OTHER): Payer: Medicare Other | Admitting: Podiatry

## 2016-09-28 ENCOUNTER — Encounter: Payer: Self-pay | Admitting: Podiatry

## 2016-09-28 VITALS — BP 106/58 | HR 78 | Resp 20

## 2016-09-28 DIAGNOSIS — B351 Tinea unguium: Secondary | ICD-10-CM

## 2016-09-28 DIAGNOSIS — M79609 Pain in unspecified limb: Secondary | ICD-10-CM

## 2016-09-28 DIAGNOSIS — M79676 Pain in unspecified toe(s): Secondary | ICD-10-CM | POA: Diagnosis not present

## 2016-09-28 NOTE — Progress Notes (Signed)
Patient ID: Dawn [name], female   DOB: 07-Aug-1928, 82 y.o.   MRN: [id number]    Subjective: This patient presents today complaining of painful toenails when walking wearing shoes and she is requesting nail debridement. Patient wearing diabetic shoes states that they're not as comfortable as her athletic style shoes, however, she wears a diabetic shoes on a daily basis  Objective: DP pulses 2/4 bilaterally PT pulses 1/4 bilaterally Capillary reflex immediate bilaterally Sensation to 10 g monofilament wire intact 4/5 bilaterally Vibratory sensation nonreactive bilaterally Ankle reflex equal and reactive bilaterally Hammertoe 2-5 bilaterally No open skin lesions bilaterally No inflammatory areas noted bilaterally The toenails are elongated, brittle, incurvated, deformed and tender to direct palpation 6-10   Assessment: Symptomatic onychomycoses 6-10 Diabetic peripheral neuropathy Hammertoe deformities 2-5 bilaterally  Plan: Debridement toenails 6-10 mechanically electronically without any bleeding   Reappoint 3 months

## 2016-09-28 NOTE — Patient Instructions (Signed)
Diabetes and Foot Care Diabetes may cause you to have problems because of poor blood supply (circulation) to your feet and legs. This may cause the skin on your feet to become thinner, break easier, and heal more slowly. Your skin may become dry, and the skin may peel and crack. You may also have nerve damage in your legs and feet causing decreased feeling in them. You may not notice minor injuries to your feet that could lead to infections or more serious problems. Taking care of your feet is one of the most important things you can do for yourself.  HOME CARE INSTRUCTIONS  Wear shoes at all times, even in the house. Do not go barefoot. Bare feet are easily injured.  Check your feet daily for blisters, cuts, and redness. If you cannot see the bottom of your feet, use a mirror or ask someone for help.  Wash your feet with warm water (do not use hot water) and mild soap. Then pat your feet and the areas between your toes until they are completely dry. Do not soak your feet as this can dry your skin.  Apply a moisturizing lotion or petroleum jelly (that does not contain alcohol and is unscented) to the skin on your feet and to dry, brittle toenails. Do not apply lotion between your toes.  Trim your toenails straight across. Do not dig under them or around the cuticle. File the edges of your nails with an emery board or nail file.  Do not cut corns or calluses or try to remove them with medicine.  Wear clean socks or stockings every day. Make sure they are not too tight. Do not wear knee-high stockings since they may decrease blood flow to your legs.  Wear shoes that fit properly and have enough cushioning. To break in new shoes, wear them for just a few hours a day. This prevents you from injuring your feet. Always look in your shoes before you put them on to be sure there are no objects inside.  Do not cross your legs. This may decrease the blood flow to your feet.  If you find a minor scrape,  cut, or break in the skin on your feet, keep it and the skin around it clean and dry. These areas may be cleansed with mild soap and water. Do not cleanse the area with peroxide, alcohol, or iodine.  When you remove an adhesive bandage, be sure not to damage the skin around it.  If you have a wound, look at it several times a day to make sure it is healing.  Do not use heating pads or hot water bottles. They may burn your skin. If you have lost feeling in your feet or legs, you may not know it is happening until it is too late.  Make sure your health care provider performs a complete foot exam at least annually or more often if you have foot problems. Report any cuts, sores, or bruises to your health care provider immediately. SEEK MEDICAL CARE IF:   You have an injury that is not healing.  You have cuts or breaks in the skin.  You have an ingrown nail.  You notice redness on your legs or feet.  You feel burning or tingling in your legs or feet.  You have pain or cramps in your legs and feet.  Your legs or feet are numb.  Your feet always feel cold. SEEK IMMEDIATE MEDICAL CARE IF:   There is increasing redness,   swelling, or pain in or around a wound.  There is a red line that goes up your leg.  Pus is coming from a wound.  You develop a fever or as directed by your health care provider.  You notice a bad smell coming from an ulcer or wound.   This information is not intended to replace advice given to you by your health care provider. Make sure you discuss any questions you have with your health care provider.   Document Released: 11/25/2000 Document Revised: 07/31/2013 Document Reviewed: 05/07/2013 Elsevier Interactive Patient Education 2016 [employer]  

## 2016-10-03 ENCOUNTER — Encounter: Payer: Self-pay | Admitting: Internal Medicine

## 2016-10-03 ENCOUNTER — Ambulatory Visit (INDEPENDENT_AMBULATORY_CARE_PROVIDER_SITE_OTHER): Payer: Medicare Other | Admitting: Internal Medicine

## 2016-10-03 VITALS — BP 110/68 | HR 79 | Temp 98.6°F | Ht 65.0 in | Wt 178.0 lb

## 2016-10-03 DIAGNOSIS — M48062 Spinal stenosis, lumbar region with neurogenic claudication: Secondary | ICD-10-CM

## 2016-10-03 DIAGNOSIS — E1121 Type 2 diabetes mellitus with diabetic nephropathy: Secondary | ICD-10-CM

## 2016-10-03 DIAGNOSIS — N183 Chronic kidney disease, stage 3 unspecified: Secondary | ICD-10-CM

## 2016-10-03 DIAGNOSIS — I10 Essential (primary) hypertension: Secondary | ICD-10-CM

## 2016-10-03 DIAGNOSIS — K5901 Slow transit constipation: Secondary | ICD-10-CM | POA: Diagnosis not present

## 2016-10-03 NOTE — Patient Instructions (Addendum)
Please bring [place] a copy of your living will and health care power of attorney.  Try senokot when you get constipation.  Start with just one capsule.

## 2016-10-03 NOTE — Progress Notes (Signed)
Location:  [name] clinic Provider:  Marica Trentham L. Mariea Clonts, D.O., C.M.D.  Code Status: DNR Goals of Care:  Advanced Directives 06/02/2016  Does patient have an advance directive? No  Type of Advance Directive -  Does patient want to make changes to advanced directive? -  Copy of advanced directive(s) in chart? -  Would patient like information on creating an advanced directive? -   Chief Complaint  Patient presents with  . Medical Management of Chronic Issues    4 mth follow-up    HPI: Patient is a 82 y.o. [name] seen today for medical management of chronic diseases.    DMII:  Brought her cbgs and they are great--98 to 190 but mostly 98-130.  Notes if she is active before checking in the am, it will spike, but gets higher as day goes on.    Still has some pain in her leg, but not nearly as bad as it used to be.  Went to [facility] for a family member's funeral.  Lost her voice completely on the way home.  Was real hoarse, but throat not sore.  Took cough medicine, but eventually got alright.  Admits to talking a lot more than usual.  Had another funeral the week before.  No dysphagia, odynophagia.  No cold symptoms.  Did have some diarrhea when she was hoarse.    Had flu shot and tdap at [place].  Had her hearing eval at the [place].  Hearing much better today.  Asks about something gentle for her bowels.  miralax was too potent.  Agreeable to trying senokot.    Past Medical History:  Diagnosis Date  . Anemia, unspecified   . Anorexia   . Chronic kidney disease   . Chronic kidney disease, stage III (moderate)   . Cystocele, midline   . Disorder of bone and cartilage, unspecified   . Kyphosis associated with other condition   . Obesity, unspecified   . Osteoarthrosis, unspecified whether generalized or localized, unspecified site   . Other and unspecified hyperlipidemia   . Other hammer toe (acquired)   . Postmenopausal atrophic vaginitis   . PVC's (premature ventricular contractions) 01/29/2015  .  RBBB 01/29/2015  . Reflux esophagitis   . Type II or unspecified type diabetes mellitus without mention of complication, not stated as uncontrolled   . Unspecified constipation   . Unspecified disorder of kidney and ureter   . Unspecified essential hypertension   . Unspecified hereditary and idiopathic peripheral neuropathy   . Unspecified urinary incontinence     Past Surgical History:  Procedure Laterality Date  . BLADDER SURGERY     tie up in 2013  . TUBAL LIGATION      Allergies  Allergen Reactions  . Keflex [Cephalexin]   . Metronidazole   . Simvastatin     Leg cramsps      Medication List       Accurate as of 10/03/16 11:27 AM. Always use your most recent med list.          aspirin 81 MG tablet Take 81 mg by mouth daily.   calcium-vitamin D 500-200 MG-UNIT tablet Commonly known as:  OSCAL WITH D Take 1 tablet by mouth daily.   cholecalciferol 1000 units tablet Commonly known as:  VITAMIN D Take 1,000 Units by mouth daily.   desoximetasone 0.05 % cream Commonly known as:  TOPICORT Apply topically 2 (two) times daily.   gabapentin 300 MG capsule Commonly known as:  NEURONTIN Take one tablet  by mouth in the morning and one tablet by mouth in the evening for pains   glipiZIDE 5 MG tablet Commonly known as:  GLUCOTROL TAKE 1 TABLET DAILY (AS OF 05/07/14)   lisinopril-hydrochlorothiazide 20-12.5 MG tablet Commonly known as:  PRINZIDE,ZESTORETIC TAKE 1 TABLET DAILY TO CONTROL BLOOD PRESSURE   oxyCODONE-acetaminophen 5-325 MG tablet Commonly known as:  ROXICET Take 1 tablet by mouth every 8 (eight) hours as needed for severe pain.   pravastatin 20 MG tablet Commonly known as:  PRAVACHOL Take 1 tablet ( 20 mg ) by mouth with evening meal   TRADJENTA 5 MG Tabs tablet Generic drug:  linagliptin TAKE 1 TABLET DAILY   vitamin E 400 UNIT capsule Take 400 Units by mouth daily.       Review of Systems:  Review of Systems  Constitutional: Negative  for chills, fever and malaise/fatigue.  HENT: Negative for congestion and hearing loss.   Eyes: Negative for blurred vision.  Respiratory: Negative for cough and shortness of breath.   Cardiovascular: Negative for chest pain, palpitations and leg swelling.  Gastrointestinal: Positive for constipation. Negative for abdominal pain, blood in stool, diarrhea, melena, nausea and vomiting.  Genitourinary: Negative for dysuria, frequency and urgency.  Musculoskeletal: Positive for back pain. Negative for falls.  Skin: Negative for itching and rash.  Neurological: Negative for dizziness, loss of consciousness and weakness.  Endo/Heme/Allergies: Does not bruise/bleed easily.  Psychiatric/Behavioral: Negative for depression and memory loss. The patient is not nervous/anxious and does not have insomnia.     Health Maintenance  Topic Date Due  . INFLUENZA VACCINE  07/12/2016  . HEMOGLOBIN A1C  11/29/2016  . OPHTHALMOLOGY EXAM  03/22/2017  . FOOT EXAM  06/02/2017  . TETANUS/TDAP  03/12/2024  . DEXA SCAN  Completed  . ZOSTAVAX  Completed  . PNA vac Low Risk Adult  Completed    Physical Exam: Vitals:   10/03/16 1110  BP: 110/68  Pulse: 79  Temp: 98.6 F (37 C)  TempSrc: Oral  SpO2: 96%  Weight: 178 lb (80.7 kg)  Height: 5\' 5"  (1.651 m)   Body mass index is 29.62 kg/m. Physical Exam  Constitutional: She is oriented to person, place, and time. She appears well-developed and well-nourished. No distress.  Eyes:  glasses  Cardiovascular: Normal rate, regular rhythm, normal heart sounds and intact distal pulses.   Pulmonary/Chest: Effort normal and breath sounds normal. No respiratory distress.  Abdominal: Soft. Bowel sounds are normal.  Musculoskeletal: Normal range of motion.  Neurological: She is alert and oriented to person, place, and time.  Skin: Skin is warm and dry.  Psychiatric: She has a normal mood and affect.    Labs reviewed: Basic Metabolic Panel:  Recent Labs   01/29/16 0955 05/30/16 1009  NA 144 142  K 4.5 4.6  CL 104 104  CO2 23 23  GLUCOSE 132* 123*  BUN 29* 24  CREATININE 1.54* 1.54*  CALCIUM 9.4 9.5   Liver Function Tests:  Recent Labs  01/29/16 0955  AST 23  ALT 16  ALKPHOS 87  BILITOT 0.3  PROT 6.7  ALBUMIN 3.9   No results for input(s): LIPASE, AMYLASE in the last 8760 hours. No results for input(s): AMMONIA in the last 8760 hours. CBC:  Recent Labs  01/29/16 0955  WBC 7.6  NEUTROABS 4.6  HCT 35.2  MCV 85  PLT 192   Lipid Panel:  Recent Labs  01/29/16 0955 05/30/16 1009  CHOL 142 131  HDL 53 46  LDLCALC 71 59  TRIG 91 129  CHOLHDL 2.7 2.8   Lab Results  Component Value Date   HGBA1C 6.6 (H) 05/30/2016    Assessment/Plan 1. Type 2 diabetes mellitus with diabetic nephropathy, without long-term current use of insulin (HCC) - cont tradjenta, glipizide, no lows, cont lisinopril, pravachol - Hemoglobin A1c - COMPLETE METABOLIC PANEL WITH GFR - COMPLETE METABOLIC PANEL WITH GFR; Future - CBC with Differential/Platelet; Future - Hemoglobin A1c; Future - Lipid panel; Future  2. Spinal stenosis, lumbar region, with neurogenic claudication -cont percocet for pain which she has been using as needed for a couple of years  3. Chronic kidney disease, stage 3 (moderate) - stable, cont ace/hctz, avoid nsaids, monitor - COMPLETE METABOLIC PANEL WITH GFR - COMPLETE METABOLIC PANEL WITH GFR; Future  4. Essential hypertension, benign -bp at goal with current therapy, cont same and monitor - COMPLETE METABOLIC PANEL WITH GFR - COMPLETE METABOLIC PANEL WITH GFR; Future  5. Slow transit constipation -try senokot s b/c miralax was too much for her and stools are sometimes hard  Labs/tests ordered:   Orders Placed This Encounter  Procedures  . Hemoglobin A1c  . COMPLETE METABOLIC PANEL WITH GFR    [facility]  . COMPLETE METABOLIC PANEL WITH GFR    [facility]    Standing Status:   Future    Standing  Expiration Date:   06/03/2017  . CBC with Differential/Platelet    Standing Status:   Future    Standing Expiration Date:   06/03/2017  . Hemoglobin A1c    Standing Status:   Future    Standing Expiration Date:   06/03/2017  . Lipid panel    Standing Status:   Future    Standing Expiration Date:   06/03/2017    Order Specific Question:   Has the patient fasted?    Answer:   Yes   Next appt:  02/02/2017  Munachimso Rigdon L. Laurren Lepkowski, D.O. [facility] [address], [place] [address] Cell Phone (Mon-Fri 8am-5pm):  [phone] On Call:  [phone] & follow prompts after 5pm & weekends Office Phone:  [phone] Office Fax:  [phone]

## 2016-10-04 LAB — COMPLETE METABOLIC PANEL WITH GFR
ALT: 14 U/L (ref 6–29)
AST: 20 U/L (ref 10–35)
Albumin: 3.6 g/dL (ref 3.6–5.1)
Alkaline Phosphatase: 79 U/L (ref 33–130)
BUN: 23 mg/dL (ref 7–25)
CO2: 26 mmol/L (ref 20–31)
Calcium: 9.2 mg/dL (ref 8.6–10.4)
Chloride: 107 mmol/L (ref 98–110)
Creat: 1.49 mg/dL — ABNORMAL HIGH (ref 0.60–0.88)
GFR, Est African American: 36 mL/min — ABNORMAL LOW (ref >=60)
GFR, Est Non African American: 32 mL/min — ABNORMAL LOW (ref >=60)
Glucose, Bld: 101 mg/dL — ABNORMAL HIGH (ref 65–99)
Potassium: 4.3 mmol/L (ref 3.5–5.3)
Sodium: 144 mmol/L (ref 135–146)
Total Bilirubin: 0.4 mg/dL (ref 0.2–1.2)
Total Protein: 6.9 g/dL (ref 6.1–8.1)

## 2016-10-04 LAB — HEMOGLOBIN A1C
Hgb A1c MFr Bld: 6.4 % — ABNORMAL HIGH (ref <5.7)
Mean Plasma Glucose: 137 mg/dL

## 2016-10-06 ENCOUNTER — Encounter: Payer: Self-pay | Admitting: *Deleted

## 2016-10-12 LAB — HM DIABETES EYE EXAM

## 2016-10-24 ENCOUNTER — Other Ambulatory Visit: Payer: Self-pay

## 2016-10-24 ENCOUNTER — Other Ambulatory Visit: Payer: Self-pay | Admitting: *Deleted

## 2016-10-24 DIAGNOSIS — M48062 Spinal stenosis, lumbar region with neurogenic claudication: Secondary | ICD-10-CM

## 2016-10-24 MED ORDER — FREESTYLE FREEDOM LITE W/DEVICE KIT: PACK | 11 refills | Status: DC

## 2016-10-24 MED ORDER — OXYCODONE-ACETAMINOPHEN 5-325 MG PO TABS: 1.0000 | ORAL_TABLET | Freq: Three times a day (TID) | ORAL | 0 refills | Status: DC | PRN

## 2016-10-24 MED ORDER — FREESTYLE LANCETS MISC: 1.0000 | Freq: Every day | 11 refills | Status: DC

## 2016-10-24 MED ORDER — GLUCOSE BLOOD VI STRP: 1.0000 | ORAL_STRIP | Freq: Every day | 11 refills | Status: DC

## 2016-10-24 NOTE — Telephone Encounter (Signed)
Patient requested and will pick up 

## 2016-11-02 ENCOUNTER — Telehealth: Payer: Self-pay | Admitting: Internal Medicine

## 2016-11-02 NOTE — Telephone Encounter (Signed)
Pt requested urine test. I called her back and left a message asking the pt to let us know what kind of problems she has had before so we will know what to order/test for. VDM (DD)

## 2016-11-11 HISTORY — PX: EYE SURGERY: SHX253

## 2016-11-22 ENCOUNTER — Encounter: Payer: Self-pay | Admitting: *Deleted

## 2016-12-06 ENCOUNTER — Other Ambulatory Visit: Payer: Self-pay | Admitting: *Deleted

## 2016-12-06 DIAGNOSIS — M48062 Spinal stenosis, lumbar region with neurogenic claudication: Secondary | ICD-10-CM

## 2016-12-06 MED ORDER — OXYCODONE-ACETAMINOPHEN 5-325 MG PO TABS: 1.0000 | ORAL_TABLET | Freq: Three times a day (TID) | ORAL | 0 refills | Status: DC | PRN

## 2016-12-06 NOTE — Telephone Encounter (Signed)
Patient requested and will pick up 

## 2016-12-28 ENCOUNTER — Ambulatory Visit: Payer: Medicare Other | Admitting: Podiatry

## 2017-01-18 ENCOUNTER — Encounter: Payer: Self-pay | Admitting: Podiatry

## 2017-01-18 ENCOUNTER — Ambulatory Visit (INDEPENDENT_AMBULATORY_CARE_PROVIDER_SITE_OTHER): Payer: Medicare Other | Admitting: Podiatry

## 2017-01-18 VITALS — BP 119/71 | HR 78 | Resp 18

## 2017-01-18 DIAGNOSIS — E1142 Type 2 diabetes mellitus with diabetic polyneuropathy: Secondary | ICD-10-CM | POA: Diagnosis not present

## 2017-01-18 DIAGNOSIS — M79609 Pain in unspecified limb: Secondary | ICD-10-CM

## 2017-01-18 DIAGNOSIS — B351 Tinea unguium: Secondary | ICD-10-CM | POA: Diagnosis not present

## 2017-01-18 NOTE — Patient Instructions (Signed)

## 2017-01-18 NOTE — Progress Notes (Signed)
Patient ID: Dawn [name], female   DOB: 1928/06/26, 83 y.o.   MRN: [id number]    Subjective: This patient presents today complaining of painful toenails when walking wearing shoes and she is requesting nail debridement. Patient wearing diabetic shoes states that they're not as comfortable as her athletic style shoes, however, she wears a diabetic shoes on a daily basis. Patient is requesting a replacement diabetic shoes today  Objective: DP pulses 2/4 bilaterally PT pulses 1/4 bilaterally Capillary reflex immediate bilaterally Sensation to 10 g monofilament wire intact 4/5 bilaterally Vibratory sensation nonreactive bilaterally Ankle reflex equal and reactive bilaterally Hammertoe 2-5 bilaterally No open skin lesions bilaterally No inflammatory areas noted bilaterally The toenails are elongated, brittle, incurvated, deformed and tender to direct palpation 6-10   Assessment: Symptomatic onychomycoses 6-10 Diabetic peripheral neuropathy Hammertoe deformities 2-5 bilaterally  Plan: Debridement toenails 6-10 mechanically electronically without any bleeding  Submit certification for diabetic shoes Indication type II diabetic Diabetic peripheral neuropathy Hammertoe 2-5 bilaterally Decreased posterior tibial pulses bilaterally Notify patient upon receipt of certification for diabetic shoes  Reappoint 3 months for nail debridement

## 2017-01-23 ENCOUNTER — Other Ambulatory Visit: Payer: Self-pay | Admitting: *Deleted

## 2017-01-23 DIAGNOSIS — M48062 Spinal stenosis, lumbar region with neurogenic claudication: Secondary | ICD-10-CM

## 2017-01-23 MED ORDER — OXYCODONE-ACETAMINOPHEN 5-325 MG PO TABS: 1.0000 | ORAL_TABLET | Freq: Three times a day (TID) | ORAL | 0 refills | Status: DC | PRN

## 2017-01-23 NOTE — Telephone Encounter (Signed)
Patient requested and will pick up 

## 2017-01-24 ENCOUNTER — Other Ambulatory Visit: Payer: Self-pay | Admitting: *Deleted

## 2017-01-24 MED ORDER — FREESTYLE LANCETS MISC: 3 refills | Status: DC

## 2017-01-24 MED ORDER — GLUCOSE BLOOD VI STRP: ORAL_STRIP | 3 refills | Status: DC

## 2017-01-24 NOTE — Telephone Encounter (Signed)
[facility]

## 2017-01-31 ENCOUNTER — Other Ambulatory Visit: Payer: Medicare Other

## 2017-01-31 DIAGNOSIS — N183 Chronic kidney disease, stage 3 unspecified: Secondary | ICD-10-CM

## 2017-01-31 DIAGNOSIS — E1121 Type 2 diabetes mellitus with diabetic nephropathy: Secondary | ICD-10-CM

## 2017-01-31 DIAGNOSIS — I10 Essential (primary) hypertension: Secondary | ICD-10-CM

## 2017-01-31 LAB — CBC WITH DIFFERENTIAL/PLATELET
Basophils Absolute: 73 cells/uL (ref 0–200)
Basophils Relative: 1 %
Eosinophils Absolute: 438 cells/uL (ref 15–500)
Eosinophils Relative: 6 %
HCT: 34.1 % — ABNORMAL LOW (ref 35.0–45.0)
Hemoglobin: 10.8 g/dL — ABNORMAL LOW (ref 11.7–15.5)
Lymphocytes Relative: 27 %
Lymphs Abs: 1971 cells/uL (ref 850–3900)
MCH: 27.6 pg (ref 27.0–33.0)
MCHC: 31.7 g/dL — ABNORMAL LOW (ref 32.0–36.0)
MCV: 87 fL (ref 80.0–100.0)
MPV: 10.3 fL (ref 7.5–12.5)
Monocytes Absolute: 584 cells/uL (ref 200–950)
Monocytes Relative: 8 %
Neutro Abs: 4234 cells/uL (ref 1500–7800)
Neutrophils Relative %: 58 %
Platelets: 192 10*3/uL (ref 140–400)
RBC: 3.92 MIL/uL (ref 3.80–5.10)
RDW: 15.9 % — ABNORMAL HIGH (ref 11.0–15.0)
WBC: 7.3 10*3/uL (ref 3.8–10.8)

## 2017-01-31 LAB — COMPLETE METABOLIC PANEL WITH GFR
ALT: 11 U/L (ref 6–29)
AST: 20 U/L (ref 10–35)
Albumin: 3.6 g/dL (ref 3.6–5.1)
Alkaline Phosphatase: 73 U/L (ref 33–130)
BUN: 35 mg/dL — ABNORMAL HIGH (ref 7–25)
CO2: 27 mmol/L (ref 20–31)
Calcium: 9.4 mg/dL (ref 8.6–10.4)
Chloride: 106 mmol/L (ref 98–110)
Creat: 1.84 mg/dL — ABNORMAL HIGH (ref 0.60–0.88)
GFR, Est African American: 28 mL/min — ABNORMAL LOW (ref >=60)
GFR, Est Non African American: 24 mL/min — ABNORMAL LOW (ref >=60)
Glucose, Bld: 109 mg/dL — ABNORMAL HIGH (ref 65–99)
Potassium: 4.6 mmol/L (ref 3.5–5.3)
Sodium: 142 mmol/L (ref 135–146)
Total Bilirubin: 0.5 mg/dL (ref 0.2–1.2)
Total Protein: 6.5 g/dL (ref 6.1–8.1)

## 2017-01-31 LAB — LIPID PANEL
Cholesterol: 144 mg/dL (ref <200)
HDL: 46 mg/dL — ABNORMAL LOW (ref >50)
LDL Cholesterol: 72 mg/dL (ref <100)
Total CHOL/HDL Ratio: 3.1 Ratio (ref <5.0)
Triglycerides: 129 mg/dL (ref <150)
VLDL: 26 mg/dL (ref <30)

## 2017-02-01 ENCOUNTER — Telehealth: Payer: Self-pay | Admitting: Podiatry

## 2017-02-01 LAB — HEMOGLOBIN A1C
Hgb A1c MFr Bld: 6.8 % — ABNORMAL HIGH (ref <5.7)
Mean Plasma Glucose: 148 mg/dL

## 2017-02-01 NOTE — Telephone Encounter (Signed)
Pt wants to find out about diabetic shoes, have we received paperwork.

## 2017-02-02 ENCOUNTER — Ambulatory Visit (INDEPENDENT_AMBULATORY_CARE_PROVIDER_SITE_OTHER): Payer: Medicare Other | Admitting: Internal Medicine

## 2017-02-02 ENCOUNTER — Encounter: Payer: Self-pay | Admitting: Internal Medicine

## 2017-02-02 ENCOUNTER — Ambulatory Visit (INDEPENDENT_AMBULATORY_CARE_PROVIDER_SITE_OTHER): Payer: Medicare Other

## 2017-02-02 VITALS — BP 128/70 | HR 83 | Temp 98.2°F | Ht 65.0 in | Wt 179.2 lb

## 2017-02-02 VITALS — BP 128/70 | HR 83 | Temp 98.2°F | Ht 65.0 in | Wt 179.0 lb

## 2017-02-02 DIAGNOSIS — N183 Chronic kidney disease, stage 3 unspecified: Secondary | ICD-10-CM

## 2017-02-02 DIAGNOSIS — Z0000 Encounter for general adult medical examination without abnormal findings: Secondary | ICD-10-CM

## 2017-02-02 DIAGNOSIS — K5901 Slow transit constipation: Secondary | ICD-10-CM

## 2017-02-02 DIAGNOSIS — H259 Unspecified age-related cataract: Secondary | ICD-10-CM | POA: Diagnosis not present

## 2017-02-02 DIAGNOSIS — E1121 Type 2 diabetes mellitus with diabetic nephropathy: Secondary | ICD-10-CM | POA: Diagnosis not present

## 2017-02-02 DIAGNOSIS — M48062 Spinal stenosis, lumbar region with neurogenic claudication: Secondary | ICD-10-CM | POA: Diagnosis not present

## 2017-02-02 DIAGNOSIS — I10 Essential (primary) hypertension: Secondary | ICD-10-CM | POA: Diagnosis not present

## 2017-02-02 NOTE — Progress Notes (Signed)
Location:  [facility] clinic Provider:  Mahlet Jergens L. Mariea Clonts, D.O., C.M.D.  Code Status: DNR Goals of Care:  Advanced Directives 02/02/2017  Does Patient Have a Medical Advance Directive? Yes  Type of Paramedic of [place];Living will  Does patient want to make changes to medical advance directive? -  Copy of [place] in Chart? No - copy requested  Would patient like information on creating a medical advance directive? -   Chief Complaint  Patient presents with  . Medical Management of Chronic Issues    84mth follow-up    HPI: Patient is a 83 y.o. female seen today for medical management of chronic diseases.    Feels ok.    Reviewed labs with her.    She went to podiatry and asked him about diabetic shoes.  He was going to fill out a form to be sent over here.  Neuropathy in feet not too bad.  Uses her gabapentin.  More bothersome in her legs.    Had cataract surgery in both eyes and has new glasses.  Seeing much better.  Some pain, but not that much.  Uses her percocet and keeps moving just slower than she used to.    Has not been taking medication for bowels.  Uses prunes and has stools every 2-3 days.    No low sugars.    BP at ooal.  There are times if she gets up too fast, she might be dizzy.  Probably happens about 1x per month.    Past Medical History:  Diagnosis Date  . Anemia, unspecified   . Anorexia   . Chronic kidney disease   . Chronic kidney disease, stage III (moderate)   . Cystocele, midline   . Disorder of bone and cartilage, unspecified   . Kyphosis associated with other condition   . Obesity, unspecified   . Osteoarthrosis, unspecified whether generalized or localized, unspecified site   . Other and unspecified hyperlipidemia   . Other hammer toe (acquired)   . Postmenopausal atrophic vaginitis   . PVC's (premature ventricular contractions) 01/29/2015  . RBBB 01/29/2015  . Reflux esophagitis   . Type II or  unspecified type diabetes mellitus without mention of complication, not stated as uncontrolled   . Unspecified constipation   . Unspecified disorder of kidney and ureter   . Unspecified essential hypertension   . Unspecified hereditary and idiopathic peripheral neuropathy   . Unspecified urinary incontinence     Past Surgical History:  Procedure Laterality Date  . BLADDER SURGERY     tie up in 2013  . EYE SURGERY  11/2016   Bilateral Cataract Sx  . TUBAL LIGATION      Allergies  Allergen Reactions  . Keflex [Cephalexin]   . Metronidazole   . Simvastatin     Leg cramsps    Allergies as of 02/02/2017      Reactions   Keflex [cephalexin]    Metronidazole    Simvastatin    Leg cramsps      Medication List       Accurate as of 02/02/17  1:10 PM. Always use your most recent med list.          aspirin 81 MG tablet Take 81 mg by mouth daily.   calcium-vitamin D 500-200 MG-UNIT tablet Commonly known as:  OSCAL WITH D Take 1 tablet by mouth daily.   cholecalciferol 1000 units tablet Commonly known as:  VITAMIN D Take 1,000 Units by mouth  daily.   desoximetasone 0.05 % cream Commonly known as:  TOPICORT Apply topically 2 (two) times daily.   freestyle lancets Use to test blood sugar. Dx: E11.21   gabapentin 300 MG capsule Commonly known as:  NEURONTIN Take one tablet by mouth in the morning and one tablet by mouth in the evening for pains   glipiZIDE 5 MG tablet Commonly known as:  GLUCOTROL TAKE 1 TABLET DAILY (AS OF 05/07/14)   glucose blood test strip Commonly known as:  FREESTYLE LITE Use to test blood sugar. Dx: E11.21   lisinopril-hydrochlorothiazide 20-12.5 MG tablet Commonly known as:  PRINZIDE,ZESTORETIC TAKE 1 TABLET DAILY TO CONTROL BLOOD PRESSURE   oxyCODONE-acetaminophen 5-325 MG tablet Commonly known as:  ROXICET Take 1 tablet by mouth every 8 (eight) hours as needed for severe pain.   pravastatin 20 MG tablet Commonly known as:   PRAVACHOL Take 1 tablet ( 20 mg ) by mouth with evening meal   TRADJENTA 5 MG Tabs tablet Generic drug:  linagliptin TAKE 1 TABLET DAILY   vitamin E 400 UNIT capsule Take 400 Units by mouth daily.       Review of Systems:  Review of Systems  Constitutional: Positive for malaise/fatigue. Negative for chills and fever.  HENT: Negative for congestion and hearing loss.   Eyes: Negative for blurred vision.  Respiratory: Negative for cough and shortness of breath.   Cardiovascular: Negative for chest pain, palpitations and leg swelling.  Gastrointestinal: Negative for abdominal pain, blood in stool, constipation and melena.  Genitourinary: Positive for frequency and urgency. Negative for dysuria.  Musculoskeletal: Positive for back pain. Negative for falls and myalgias.  Skin: Negative for itching and rash.  Neurological: Positive for dizziness, tingling, sensory change and weakness. Negative for loss of consciousness.  Endo/Heme/Allergies: Does not bruise/bleed easily.  Psychiatric/Behavioral: Negative for depression and memory loss. The patient is not nervous/anxious and does not have insomnia.     Health Maintenance  Topic Date Due  . FOOT EXAM  06/02/2017  . HEMOGLOBIN A1C  07/31/2017  . OPHTHALMOLOGY EXAM  10/12/2017  . TETANUS/TDAP  10/01/2026  . INFLUENZA VACCINE  Completed  . DEXA SCAN  Completed  . PNA vac Low Risk Adult  Completed    Physical Exam: Vitals:   02/02/17 1301  BP: 128/70  Pulse: 83  Temp: 98.2 F (36.8 C)  TempSrc: Oral  Weight: 179 lb (81.2 kg)  Height: 5\' 5"  (1.651 m)   Body mass index is 29.79 kg/m. Physical Exam  Constitutional: She is oriented to person, place, and time. She appears well-developed and well-nourished. No distress.  HENT:  Head: Normocephalic and atraumatic.  Right Ear: External ear normal.  Left Ear: External ear normal.  Nose: Nose normal.  Mouth/Throat: Oropharynx is clear and moist. No oropharyngeal exudate.  Eyes:  EOM are normal. Pupils are equal, round, and reactive to light.  New glasses  Neck: Normal range of motion. Neck supple.  Cardiovascular: Normal rate, regular rhythm, normal heart sounds and intact distal pulses.   Pulmonary/Chest: Effort normal and breath sounds normal. No respiratory distress.  Abdominal: Soft. Bowel sounds are normal. She exhibits no distension. There is no tenderness.  Musculoskeletal: She exhibits tenderness.  Difficulty getting up out of chair even using arms, uses cane to ambulate, tenderness of lower back  Neurological: She is alert and oriented to person, place, and time. A sensory deficit is present. No cranial nerve deficit.  Skin: Skin is warm and dry. Capillary refill takes less than  2 seconds.  Psychiatric: She has a normal mood and affect. Her behavior is normal. Judgment and thought content normal.    Labs reviewed: Basic Metabolic Panel:  Recent Labs  05/30/16 1009 10/03/16 1155 01/31/17 0939  NA 142 144 142  K 4.6 4.3 4.6  CL 104 107 106  CO2 23 26 27   GLUCOSE 123* 101* 109*  BUN 24 23 35*  CREATININE 1.54* 1.49* 1.84*  CALCIUM 9.5 9.2 9.4   Liver Function Tests:  Recent Labs  10/03/16 1155 01/31/17 0939  AST 20 20  ALT 14 11  ALKPHOS 79 73  BILITOT 0.4 0.5  PROT 6.9 6.5  ALBUMIN 3.6 3.6   No results for input(s): LIPASE, AMYLASE in the last 8760 hours. No results for input(s): AMMONIA in the last 8760 hours. CBC:  Recent Labs  01/31/17 0939  WBC 7.3  NEUTROABS 4,234  HGB 10.8*  HCT 34.1*  MCV 87.0  PLT 192   Lipid Panel:  Recent Labs  05/30/16 1009 01/31/17 0939  CHOL 131 144  HDL 46 46*  LDLCALC 59 72  TRIG 129 129  CHOLHDL 2.8 3.1   Lab Results  Component Value Date   HGBA1C 6.8 (H) 01/31/2017    Assessment/Plan 1. Type 2 diabetes mellitus with diabetic nephropathy, without long-term current use of insulin (HCC) - continue tradjenta, glipizide, cont ace, statin - well controlled -CBC with  Differential/Platelet; Future - Basic metabolic panel; Future - Lipid panel; Future - Hemoglobin A1c; Future  2. Spinal stenosis, lumbar region, with neurogenic claudication -stable with percocet and gabapentin, uses cane to steady herself  3. Chronic kidney disease, stage 3 (moderate) - ongoing, slight bump in cr on labs--reports not drinking much water the night before or any at all the morning of labs--is trying to increase her fluid intake--emphasized importance for this and to help with dizziness on standing - CBC with Differential/Platelet; Future - Basic metabolic panel; Future  4. Essential hypertension, benign - well controlled on current regimen, cont same, hydrate well with hctz - Basic metabolic panel; Future  5. Slow transit constipation -cont prunes, fiber, increase water, rarely uses medications for this  6. Age-related cataract of both eyes, unspecified age-related cataract type - s/p extraction, seeing better and has new glasses  Labs/tests ordered:   Orders Placed This Encounter  Procedures  . CBC with Differential/Platelet    Standing Status:   Future    Standing Expiration Date:   10/02/2017  . Basic metabolic panel    Standing Status:   Future    Standing Expiration Date:   10/02/2017    Order Specific Question:   Has the patient fasted?    Answer:   Yes  . Lipid panel    Standing Status:   Future    Standing Expiration Date:   10/02/2017    Order Specific Question:   Has the patient fasted?    Answer:   Yes  . Hemoglobin A1c    Standing Status:   Future    Standing Expiration Date:   10/02/2017    Next appt:  Visit date not found   Deryl Giroux L. Zameer Borman, D.O. [facility] [address], [place] [address] Cell Phone (Mon-Fri 8am-5pm):  [phone] On Call:  [phone] & follow prompts after 5pm & weekends Office Phone:  [phone] Office Fax:  [phone]

## 2017-02-02 NOTE — Progress Notes (Signed)
Subjective:   Dawn [name] is a 83 y.o. female who presents for Medicare Annual (Subsequent) preventive examination.  Review of Systems:  Cardiac Risk Factors include: advanced age (>50mn, >>37women);dyslipidemia;diabetes mellitus;family history of premature cardiovascular disease;hypertension     Objective:     Vitals: BP 128/70 (BP Location: Right Arm, Patient Position: Sitting, Cuff Size: Normal)   Pulse 83   Temp 98.2 F (36.8 C) (Oral)   Ht 5' 5"  (1.651 m)   Wt 179 lb 3.2 oz (81.3 kg)   SpO2 95%   BMI 29.82 kg/m   Body mass index is 29.82 kg/m.   Tobacco History  Smoking Status  . Former Smoker  . Quit date: 12/12/1968  Smokeless Tobacco  . Never Used    Comment: quit in 1970     Counseling given: No   Past Medical History:  Diagnosis Date  . Anemia, unspecified   . Anorexia   . Chronic kidney disease   . Chronic kidney disease, stage III (moderate)   . Cystocele, midline   . Disorder of bone and cartilage, unspecified   . Kyphosis associated with other condition   . Obesity, unspecified   . Osteoarthrosis, unspecified whether generalized or localized, unspecified site   . Other and unspecified hyperlipidemia   . Other hammer toe (acquired)   . Postmenopausal atrophic vaginitis   . PVC's (premature ventricular contractions) 01/29/2015  . RBBB 01/29/2015  . Reflux esophagitis   . Type II or unspecified type diabetes mellitus without mention of complication, not stated as uncontrolled   . Unspecified constipation   . Unspecified disorder of kidney and ureter   . Unspecified essential hypertension   . Unspecified hereditary and idiopathic peripheral neuropathy   . Unspecified urinary incontinence    Past Surgical History:  Procedure Laterality Date  . BLADDER SURGERY     tie up in 2013  . EYE SURGERY  11/2016   Bilateral Cataract Sx  . TUBAL LIGATION     Family History  Problem Relation Age of Onset  . Heart disease Mother   . Heart disease  Sister   . Cancer Son   . Cancer Son    History  Sexual Activity  . Sexual activity: No    Outpatient Encounter Prescriptions as of 02/02/2017  Medication Sig  . aspirin 81 MG tablet Take 81 mg by mouth daily.  . Blood Glucose Monitoring Suppl (FREESTYLE FREEDOM LITE) w/Device KIT Check blood sugar once daily as directed E11.21  . calcium-vitamin D (OSCAL WITH D) 500-200 MG-UNIT per tablet Take 1 tablet by mouth daily.  . cholecalciferol (VITAMIN D) 1000 UNITS tablet Take 1,000 Units by mouth daily.  .Marland Kitchendesoximetasone (TOPICORT) 0.05 % cream Apply topically 2 (two) times daily.  .Marland Kitchengabapentin (NEURONTIN) 300 MG capsule Take one tablet by mouth in the morning and one tablet by mouth in the evening for pains  . glipiZIDE (GLUCOTROL) 5 MG tablet TAKE 1 TABLET DAILY (AS OF 05/07/14)  . glucose blood (FREESTYLE LITE) test strip Use to test blood sugar. Dx: E11.21  . Lancets (FREESTYLE) lancets Use to test blood sugar. Dx: E11.21  . lisinopril-hydrochlorothiazide (PRINZIDE,ZESTORETIC) 20-12.5 MG tablet TAKE 1 TABLET DAILY TO CONTROL BLOOD PRESSURE  . oxyCODONE-acetaminophen (ROXICET) 5-325 MG tablet Take 1 tablet by mouth every 8 (eight) hours as needed for severe pain.  . pravastatin (PRAVACHOL) 20 MG tablet Take 1 tablet ( 20 mg ) by mouth with evening meal  . TRADJENTA 5 MG TABS tablet TAKE  1 TABLET DAILY  . vitamin E 400 UNIT capsule Take 400 Units by mouth daily.   No facility-administered encounter medications on file as of 02/02/2017.     Activities of Daily Living In your present state of health, do you have any difficulty performing the following activities: 02/02/2017  Hearing? Y  Vision? N  Difficulty concentrating or making decisions? N  Walking or climbing stairs? N  Dressing or bathing? N  Doing errands, shopping? N  Preparing Food and eating ? N  Using the Toilet? N  In the past six months, have you accidently leaked urine? Y  Do you have problems with loss of bowel  control? N  Managing your Medications? N  Managing your Finances? N  Housekeeping or managing your Housekeeping? N  Some recent data might be hidden    Patient Care Team: Gayland Curry, DO as PCP - General (Geriatric Medicine) Clent Jacks, MD as Consulting Physician (Ophthalmology)    Assessment:     Exercise Activities and Dietary recommendations Current Exercise Habits: The patient does not participate in regular exercise at present  Goals    . <enter goal here>          Starting 02/02/17, I will maintain my current lifestyle.       Fall Risk Fall Risk  02/02/2017 10/03/2016 06/02/2016 02/01/2016 02/01/2016  Falls in the past year? No No No Yes Yes  Number falls in past yr: - - - 1 1  Injury with Fall? - - - No No  Risk for fall due to : - - - History of fall(s);Impaired mobility -  Follow up - - - Falls prevention discussed -   Depression Screen PHQ 2/9 Scores 02/02/2017 10/03/2016 06/02/2016 02/01/2016  PHQ - 2 Score 0 0 0 0     Cognitive Function MMSE - Mini Mental State Exam 02/01/2016 01/06/2015  Orientation to time 5 5  Orientation to Place 5 5  Registration 3 3  Attention/ Calculation 5 5  Recall 3 2  Language- name 2 objects 2 2  Language- repeat 1 1  Language- follow 3 step command 3 3  Language- read & follow direction 1 1  Write a sentence 1 1  Copy design 1 1  Total score 30 29        Immunization History  Administered Date(s) Administered  . Influenza,inj,Quad PF,36+ Mos 10/08/2013, 09/30/2014  . Influenza-Unspecified 10/17/2012, 09/12/2015, 10/01/2016  . Pneumococcal Conjugate-13 10/31/1997, 07/29/2014  . Pneumococcal Polysaccharide-23 07/30/2015  . Tdap 10/31/1997, 03/12/2014, 10/01/2016  . Zoster 07/30/2014   Screening Tests Health Maintenance  Topic Date Due  . FOOT EXAM  06/02/2017  . HEMOGLOBIN A1C  07/31/2017  . OPHTHALMOLOGY EXAM  10/12/2017  . TETANUS/TDAP  10/01/2026  . INFLUENZA VACCINE  Completed  . DEXA SCAN  Completed  .  PNA vac Low Risk Adult  Completed      Plan:    I have personally reviewed and addressed the Medicare Annual Wellness questionnaire and have noted the following in the patient's chart:  A. Medical and social history B. Use of alcohol, tobacco or illicit drugs  C. Current medications and supplements D. Functional ability and status E.  Nutritional status F.  Physical activity G. Advance directives H. List of other physicians I.  Hospitalizations, surgeries, and ER visits in previous 12 months J.  [place] to include hearing, vision, cognitive, depression L. Referrals and appointments - none  In addition, I have reviewed and discussed with patient  certain preventive protocols, quality metrics, and best practice recommendations. A written personalized care plan for preventive services as well as general preventive health recommendations were provided to patient.  See attached scanned questionnaire for additional information.   Signed,   Allyn Kenner, LPN Health Advisor

## 2017-02-02 NOTE — Patient Instructions (Addendum)
Dawn [name] , Thank you for taking time to come for your Medicare Wellness Visit. I appreciate your ongoing commitment to your health goals. Please review the following plan we discussed and let me know if I can assist you in the future.   These are the goals we discussed: Goals    . <enter goal here>          Starting 02/02/17, I will maintain my current lifestyle.        This is a list of the screening recommended for you and due dates:  Health Maintenance  Topic Date Due  . Complete foot exam   06/02/2017  . Hemoglobin A1C  07/31/2017  . Eye exam for diabetics  10/12/2017  . Tetanus Vaccine  10/01/2026  . Flu Shot  Completed  . DEXA scan (bone density measurement)  Completed  . Pneumonia vaccines  Completed  Preventive Care for Adults  A healthy lifestyle and preventive care can promote health and wellness. Preventive health guidelines for adults include the following key practices.  . A routine yearly physical is a good way to check with your health care provider about your health and preventive screening. It is a chance to share any concerns and updates on your health and to receive a thorough exam.  . Visit your dentist for a routine exam and preventive care every 6 months. Brush your teeth twice a day and floss once a day. Good oral hygiene prevents tooth decay and gum disease.  . The frequency of eye exams is based on your age, health, family medical history, use  of contact lenses, and other factors. Follow your health care provider's ecommendations for frequency of eye exams.  . Eat a healthy diet. Foods like vegetables, fruits, whole grains, low-fat dairy products, and lean protein foods contain the nutrients you need without too many calories. Decrease your intake of foods high in solid fats, added sugars, and salt. Eat the right amount of calories for you. Get information about a proper diet from your health care provider, if necessary.  . Regular physical exercise is  one of the most important things you can do for your health. Most adults should get at least 150 minutes of moderate-intensity exercise (any activity that increases your heart rate and causes you to sweat) each week. In addition, most adults need muscle-strengthening exercises on 2 or more days a week.  Silver Sneakers may be a benefit available to you. To determine eligibility, you may visit the website: www.silversneakers.com or contact program at 6032107787 Mon-Fri between 8AM-8PM.   . Maintain a healthy weight. The body mass index (BMI) is a screening tool to identify possible weight problems. It provides an estimate of body fat based on height and weight. Your health care provider can find your BMI and can help you achieve or maintain a healthy weight.   For adults 20 years and older: ? A BMI below 18.5 is considered underweight. ? A BMI of 18.5 to 24.9 is normal. ? A BMI of 25 to 29.9 is considered overweight. ? A BMI of 30 and above is considered obese.   . Maintain normal blood lipids and cholesterol levels by exercising and minimizing your intake of saturated fat. Eat a balanced diet with plenty of fruit and vegetables. Blood tests for lipids and cholesterol should begin at age 36 and be repeated every 5 years. If your lipid or cholesterol levels are high, you are over 50, or you are at high risk for heart  disease, you may need your cholesterol levels checked more frequently. Ongoing high lipid and cholesterol levels should be treated with medicines if diet and exercise are not working.  . If you smoke, find out from your health care provider how to quit. If you do not use tobacco, please do not start.  . If you choose to drink alcohol, please do not consume more than 2 drinks per day. One drink is considered to be 12 ounces (355 mL) of beer, 5 ounces (148 mL) of wine, or 1.5 ounces (44 mL) of liquor.  . If you are 27-82 years old, ask your health care provider if you should take  aspirin to prevent strokes.  . Use sunscreen. Apply sunscreen liberally and repeatedly throughout the day. You should seek shade when your shadow is shorter than you. Protect yourself by wearing long sleeves, pants, a wide-brimmed hat, and sunglasses year round, whenever you are outdoors.  . Once a month, do a whole body skin exam, using a mirror to look at the skin on your back. Tell your health care provider of new moles, moles that have irregular borders, moles that are larger than a pencil eraser, or moles that have changed in shape or color.

## 2017-02-02 NOTE — Progress Notes (Signed)
   I reviewed health advisor's note, was available for consultation and agree with the assessment and plan as written.  Pt did not ask me about the exercises.  Her podiatrist is supposed to be sending me the forms to get her diabetic shoes.    [name] L. [name], D.O. [facility] [address], [place] [address] Cell Phone (Mon-Fri 8am-5pm):  [phone] On Call:  [phone] & follow prompts after 5pm & weekends Office Phone:  [phone] Office Fax:  [phone]   Quick Notes   Health Maintenance:  None    Abnormal Screen: MMSE 29/30-Passed Clock Test    Patient Concerns:  Pt was wondering if you have any info about her getting her diabetic shoes. She also has some concerns with walking; states she can't walk as much as she used and wanted to know what type of exercises she can do to work on that.     Nurse Concerns:  None

## 2017-02-08 ENCOUNTER — Telehealth: Payer: Self-pay | Admitting: *Deleted

## 2017-02-08 NOTE — Telephone Encounter (Addendum)
Pt states she had an appt 01/18/2017 and she was to hear something back. I reviewed Labs, Imaging, CV Proc and I saw no orders or results for Dr. Amalia Hailey, pt had called concerning diabetic shoes. Routed note to M. Leslee Home to check concerning the shoes. 02/16/2017-Pt states she is trying to find out what she needs to do about getting her diabetic shoes.

## 2017-02-16 ENCOUNTER — Other Ambulatory Visit: Payer: Self-pay | Admitting: Internal Medicine

## 2017-02-16 DIAGNOSIS — E1121 Type 2 diabetes mellitus with diabetic nephropathy: Secondary | ICD-10-CM

## 2017-02-20 ENCOUNTER — Telehealth: Payer: Self-pay | Admitting: Podiatry

## 2017-02-20 NOTE — Telephone Encounter (Signed)
Finally reached pt by phone paperwork has been submitted but has not returned we will call when it arrives

## 2017-02-20 NOTE — Telephone Encounter (Signed)
Pt checking on diabetic shoes.Please call her back asap

## 2017-02-27 ENCOUNTER — Other Ambulatory Visit: Payer: Self-pay | Admitting: Internal Medicine

## 2017-02-27 DIAGNOSIS — E1121 Type 2 diabetes mellitus with diabetic nephropathy: Secondary | ICD-10-CM

## 2017-03-13 ENCOUNTER — Other Ambulatory Visit: Payer: Self-pay | Admitting: *Deleted

## 2017-03-13 DIAGNOSIS — M48062 Spinal stenosis, lumbar region with neurogenic claudication: Secondary | ICD-10-CM

## 2017-03-13 MED ORDER — OXYCODONE-ACETAMINOPHEN 5-325 MG PO TABS: 1.0000 | ORAL_TABLET | Freq: Three times a day (TID) | ORAL | 0 refills | Status: DC | PRN

## 2017-03-13 NOTE — Telephone Encounter (Signed)
Patient requested and will pick up 

## 2017-03-13 NOTE — Telephone Encounter (Signed)
Patient informed rx signed and ready for pickup

## 2017-04-04 ENCOUNTER — Encounter: Payer: Self-pay | Admitting: Internal Medicine

## 2017-04-19 ENCOUNTER — Ambulatory Visit (INDEPENDENT_AMBULATORY_CARE_PROVIDER_SITE_OTHER): Payer: Medicare Other | Admitting: Podiatry

## 2017-04-19 DIAGNOSIS — B351 Tinea unguium: Secondary | ICD-10-CM | POA: Diagnosis not present

## 2017-04-19 DIAGNOSIS — M79609 Pain in unspecified limb: Secondary | ICD-10-CM

## 2017-04-19 DIAGNOSIS — E1121 Type 2 diabetes mellitus with diabetic nephropathy: Secondary | ICD-10-CM | POA: Diagnosis not present

## 2017-04-19 NOTE — Patient Instructions (Signed)

## 2017-04-19 NOTE — Progress Notes (Signed)
Patient ID: Dawn [name], female   DOB: September 08, 1928, 83 y.o.   MRN: [id number]     Subjective: This patient presents today complaining of painful toenails when walking wearing shoes and she is requesting nail debridement. Patient wearing diabetic shoes states that they're not as comfortable as her athletic style shoes, however, she wears a diabetic shoes on a daily basis. Patient is requesting a replacement diabetic shoes today  Objective: DP pulses 2/4 bilaterally PT pulses 1/4 bilaterally Capillary reflex immediate bilaterally Sensation to 10 g monofilament wire intact 4/5 bilaterally Vibratory sensation nonreactive bilaterally Ankle reflex equal and reactive bilaterally HAV left Hammertoe 2-5 bilaterally No open skin lesions bilaterally No inflammatory areas noted bilaterally The toenails are elongated, brittle, incurvated, deformed and tender to direct palpation 6-10 Atrophic skin with absent hair growth bilaterally  Assessment: Symptomatic onychomycoses 6-10 Diabetic peripheral neuropathy Hammertoe deformities 2-5 bilaterally  Plan: Debridement toenails 6-10 mechanically electronically without any bleeding  Submit certification for diabetic shoes Indication type II diabetic Diabetic peripheral neuropathy Hammertoe 2-5 bilaterally Decreased posterior tibial pulses bilaterally Notify patient upon receipt of certification for diabetic shoes Resubmit certification incorrect provider Foam impression obtained today for diabetic shoes with custom insoles Reappoint 3 months for nail debridement  Notify patient upon receipt of diabetic shoes with custom insoles

## 2017-04-25 ENCOUNTER — Telehealth: Payer: Self-pay

## 2017-04-25 ENCOUNTER — Other Ambulatory Visit: Payer: Self-pay | Admitting: *Deleted

## 2017-04-25 DIAGNOSIS — M48062 Spinal stenosis, lumbar region with neurogenic claudication: Secondary | ICD-10-CM

## 2017-04-25 MED ORDER — OXYCODONE-ACETAMINOPHEN 5-325 MG PO TABS: 1.0000 | ORAL_TABLET | Freq: Three times a day (TID) | ORAL | 0 refills | Status: DC | PRN

## 2017-04-25 NOTE — Telephone Encounter (Signed)
I called patient to let her know that she has a prescription ready to be picked up at the office. I left a message on patient's voicemail to call the office if she has any questions.   Prescription was placed in filing cabinet at front desk.

## 2017-04-25 NOTE — Telephone Encounter (Signed)
Patient requested and will pick up 

## 2017-05-23 ENCOUNTER — Encounter: Payer: Self-pay | Admitting: Internal Medicine

## 2017-05-30 ENCOUNTER — Ambulatory Visit (INDEPENDENT_AMBULATORY_CARE_PROVIDER_SITE_OTHER): Payer: Medicare Other | Admitting: Orthotics

## 2017-05-30 DIAGNOSIS — M2041 Other hammer toe(s) (acquired), right foot: Secondary | ICD-10-CM

## 2017-05-30 DIAGNOSIS — E1142 Type 2 diabetes mellitus with diabetic polyneuropathy: Secondary | ICD-10-CM | POA: Diagnosis not present

## 2017-05-30 DIAGNOSIS — M2042 Other hammer toe(s) (acquired), left foot: Secondary | ICD-10-CM | POA: Diagnosis not present

## 2017-05-30 NOTE — Progress Notes (Signed)
Patient came in today to pick up diabetic shoes and custom inserts.  Same was well pleased with fit and function.   The foot ortheses offered full contact with plantar surface and contoured the arch well.   The shoes fit well with no heel slippage and areas of pressure concern.   Patient advised to contact us if any problems arise.  Shoe final definitive fitting:  Apex Petals [id number] 10.5 M  [id number] x 3 pair.

## 2017-06-06 ENCOUNTER — Other Ambulatory Visit: Payer: Self-pay | Admitting: *Deleted

## 2017-06-06 ENCOUNTER — Other Ambulatory Visit: Payer: Medicare Other

## 2017-06-06 DIAGNOSIS — M48062 Spinal stenosis, lumbar region with neurogenic claudication: Secondary | ICD-10-CM

## 2017-06-06 MED ORDER — OXYCODONE-ACETAMINOPHEN 5-325 MG PO TABS: 1.0000 | ORAL_TABLET | Freq: Three times a day (TID) | ORAL | 0 refills | Status: DC | PRN

## 2017-06-06 NOTE — Telephone Encounter (Signed)
Patient requested and will pick up 

## 2017-06-08 ENCOUNTER — Ambulatory Visit: Payer: Medicare Other | Admitting: Internal Medicine

## 2017-07-03 ENCOUNTER — Other Ambulatory Visit: Payer: Medicare Other

## 2017-07-06 ENCOUNTER — Ambulatory Visit: Payer: Medicare Other | Admitting: Internal Medicine

## 2017-07-10 ENCOUNTER — Other Ambulatory Visit: Payer: Medicare Other

## 2017-07-10 DIAGNOSIS — I10 Essential (primary) hypertension: Secondary | ICD-10-CM

## 2017-07-10 DIAGNOSIS — N183 Chronic kidney disease, stage 3 unspecified: Secondary | ICD-10-CM

## 2017-07-10 DIAGNOSIS — E1121 Type 2 diabetes mellitus with diabetic nephropathy: Secondary | ICD-10-CM

## 2017-07-10 LAB — LIPID PANEL
Cholesterol: 122 mg/dL (ref <200)
HDL: 30 mg/dL — ABNORMAL LOW (ref >50)
LDL Cholesterol: 55 mg/dL (ref <100)
Total CHOL/HDL Ratio: 4.1 Ratio (ref <5.0)
Triglycerides: 186 mg/dL — ABNORMAL HIGH (ref <150)
VLDL: 37 mg/dL — ABNORMAL HIGH (ref <30)

## 2017-07-10 LAB — CBC WITH DIFFERENTIAL/PLATELET
Basophils Absolute: 79 cells/uL (ref 0–200)
Basophils Relative: 1 %
Eosinophils Absolute: 632 cells/uL — ABNORMAL HIGH (ref 15–500)
Eosinophils Relative: 8 %
HCT: 34.6 % — ABNORMAL LOW (ref 35.0–45.0)
Hemoglobin: 10.8 g/dL — ABNORMAL LOW (ref 11.7–15.5)
Lymphocytes Relative: 30 %
Lymphs Abs: 2370 cells/uL (ref 850–3900)
MCH: 27.3 pg (ref 27.0–33.0)
MCHC: 31.2 g/dL — ABNORMAL LOW (ref 32.0–36.0)
MCV: 87.6 fL (ref 80.0–100.0)
MPV: 10.6 fL (ref 7.5–12.5)
Monocytes Absolute: 553 cells/uL (ref 200–950)
Monocytes Relative: 7 %
Neutro Abs: 4266 cells/uL (ref 1500–7800)
Neutrophils Relative %: 54 %
Platelets: 186 10*3/uL (ref 140–400)
RBC: 3.95 MIL/uL (ref 3.80–5.10)
RDW: 15.9 % — ABNORMAL HIGH (ref 11.0–15.0)
WBC: 7.9 10*3/uL (ref 3.8–10.8)

## 2017-07-10 LAB — BASIC METABOLIC PANEL
BUN: 24 mg/dL (ref 7–25)
CO2: 23 mmol/L (ref 20–31)
Calcium: 8.7 mg/dL (ref 8.6–10.4)
Chloride: 106 mmol/L (ref 98–110)
Creat: 1.53 mg/dL — ABNORMAL HIGH (ref 0.60–0.88)
Glucose, Bld: 85 mg/dL (ref 65–99)
Potassium: 4 mmol/L (ref 3.5–5.3)
Sodium: 142 mmol/L (ref 135–146)

## 2017-07-11 LAB — HEMOGLOBIN A1C
Hgb A1c MFr Bld: 6.6 % — ABNORMAL HIGH (ref <5.7)
Mean Plasma Glucose: 143 mg/dL

## 2017-07-13 ENCOUNTER — Encounter: Payer: Self-pay | Admitting: Internal Medicine

## 2017-07-13 ENCOUNTER — Ambulatory Visit (INDEPENDENT_AMBULATORY_CARE_PROVIDER_SITE_OTHER): Payer: Medicare Other | Admitting: Internal Medicine

## 2017-07-13 VITALS — BP 120/70 | HR 82 | Temp 97.7°F | Wt 174.0 lb

## 2017-07-13 DIAGNOSIS — E1122 Type 2 diabetes mellitus with diabetic chronic kidney disease: Secondary | ICD-10-CM | POA: Diagnosis not present

## 2017-07-13 DIAGNOSIS — E782 Mixed hyperlipidemia: Secondary | ICD-10-CM

## 2017-07-13 DIAGNOSIS — I10 Essential (primary) hypertension: Secondary | ICD-10-CM | POA: Diagnosis not present

## 2017-07-13 DIAGNOSIS — E1142 Type 2 diabetes mellitus with diabetic polyneuropathy: Secondary | ICD-10-CM | POA: Diagnosis not present

## 2017-07-13 DIAGNOSIS — M159 Polyosteoarthritis, unspecified: Secondary | ICD-10-CM

## 2017-07-13 DIAGNOSIS — B9789 Other viral agents as the cause of diseases classified elsewhere: Secondary | ICD-10-CM | POA: Diagnosis not present

## 2017-07-13 DIAGNOSIS — M48062 Spinal stenosis, lumbar region with neurogenic claudication: Secondary | ICD-10-CM | POA: Diagnosis not present

## 2017-07-13 DIAGNOSIS — K5903 Drug induced constipation: Secondary | ICD-10-CM | POA: Diagnosis not present

## 2017-07-13 DIAGNOSIS — N183 Chronic kidney disease, stage 3 unspecified: Secondary | ICD-10-CM

## 2017-07-13 DIAGNOSIS — J069 Acute upper respiratory infection, unspecified: Secondary | ICD-10-CM

## 2017-07-13 DIAGNOSIS — T402X5A Adverse effect of other opioids, initial encounter: Secondary | ICD-10-CM | POA: Diagnosis not present

## 2017-07-13 NOTE — Progress Notes (Signed)
Location:  [facility] clinic Provider:  Salimah Martinovich L. Mariea Clonts, D.O., C.M.D.  Code Status: DNR Goals of Care:  Advanced Directives 02/02/2017  Does Patient Have a Medical Advance Directive? Yes  Type of Paramedic of [place];Living will  Does patient want to make changes to medical advance directive? -  Copy of [place] in Chart? No - copy requested  Would patient like information on creating a medical advance directive? -     Chief Complaint  Patient presents with  . Medical Management of Chronic Issues    62mth follow-up    HPI: Patient is a 83 y.o. female seen today for medical management of chronic diseases.    Mobility is a problem.  Getting around is getting harder.  She's thought about getting a wheelchair.  Uses her cane mostly.  She does have a walker at home, but it seems awkward.  She's not sure if she knows how to use it.  She continues to have difficulty with pain in her legs from time to time.  Imaging had shown this stemmed from her back.  She cannot walk long distances w/o pain.    Has picked up a cold while traveling to [place], [place].  It was hot outside, but the [facility] was on in the hotel and she froze.  Has a stuffy head and cough.  No discolored mucus.  No sick contacts.  She went to a convention.    Constipation:  Still has to take medication occasionally, but it goes through her when she does.  She tries to eat better to avoid it and hydrate better.  DMII:  Has not had any known low sugars.  She will occasionally feel dizzy when she goes to stand, but very rare like once every 6 mos.  She has lost 5 lbs with her improved diet.  We discussed hypoglycemia.    Past Medical History:  Diagnosis Date  . Anemia, unspecified   . Anorexia   . Chronic kidney disease   . Chronic kidney disease, stage III (moderate)   . Cystocele, midline   . Disorder of bone and cartilage, unspecified   . Kyphosis associated with other condition   .  Obesity, unspecified   . Osteoarthrosis, unspecified whether generalized or localized, unspecified site   . Other and unspecified hyperlipidemia   . Other hammer toe (acquired)   . Postmenopausal atrophic vaginitis   . PVC's (premature ventricular contractions) 01/29/2015  . RBBB 01/29/2015  . Reflux esophagitis   . Type II or unspecified type diabetes mellitus without mention of complication, not stated as uncontrolled   . Unspecified constipation   . Unspecified disorder of kidney and ureter   . Unspecified essential hypertension   . Unspecified hereditary and idiopathic peripheral neuropathy   . Unspecified urinary incontinence     Past Surgical History:  Procedure Laterality Date  . BLADDER SURGERY     tie up in 2013  . EYE SURGERY  11/2016   Bilateral Cataract Sx  . TUBAL LIGATION      Allergies  Allergen Reactions  . Keflex [Cephalexin]   . Metronidazole   . Simvastatin     Leg cramsps    Allergies as of 07/13/2017      Reactions   Keflex [cephalexin]    Metronidazole    Simvastatin    Leg cramsps      Medication List       Accurate as of 07/13/17  8:09 AM. Always use  your most recent med list.          aspirin 81 MG tablet Take 81 mg by mouth daily.   calcium-vitamin D 500-200 MG-UNIT tablet Commonly known as:  OSCAL WITH D Take 1 tablet by mouth daily.   cholecalciferol 1000 units tablet Commonly known as:  VITAMIN D Take 1,000 Units by mouth daily.   desoximetasone 0.05 % cream Commonly known as:  TOPICORT Apply topically 2 (two) times daily.   freestyle lancets Use to test blood sugar. Dx: E11.21   gabapentin 300 MG capsule Commonly known as:  NEURONTIN Take one tablet by mouth in the morning and one tablet by mouth in the evening for pains   glipiZIDE 5 MG tablet Commonly known as:  GLUCOTROL TAKE 1 TABLET DAILY   glucose blood test strip Commonly known as:  FREESTYLE LITE Use to test blood sugar. Dx: E11.21     lisinopril-hydrochlorothiazide 20-12.5 MG tablet Commonly known as:  PRINZIDE,ZESTORETIC TAKE 1 TABLET DAILY TO CONTROL BLOOD PRESSURE   oxyCODONE-acetaminophen 5-325 MG tablet Commonly known as:  ROXICET Take 1 tablet by mouth every 8 (eight) hours as needed for severe pain.   pravastatin 20 MG tablet Commonly known as:  PRAVACHOL Take 1 tablet ( 20 mg ) by mouth with evening meal   TRADJENTA 5 MG Tabs tablet Generic drug:  linagliptin TAKE 1 TABLET DAILY   vitamin E 400 UNIT capsule Take 400 Units by mouth daily.       Review of Systems:  Review of Systems  Constitutional: Negative for chills, fever and malaise/fatigue.  HENT: Positive for congestion and hearing loss. Negative for sinus pain and sore throat.   Eyes: Negative for blurred vision.       Glasses  Respiratory: Positive for cough. Negative for shortness of breath and stridor.   Cardiovascular: Negative for chest pain and palpitations.  Gastrointestinal: Negative for abdominal pain, blood in stool, constipation and melena.  Genitourinary: Negative for dysuria.  Musculoskeletal: Negative for falls and myalgias.       Pain in legs with ambulation  Skin: Negative for itching and rash.  Neurological: Positive for tingling and sensory change. Negative for weakness.  Endo/Heme/Allergies: Does not bruise/bleed easily.  Psychiatric/Behavioral: Negative for depression and memory loss. The patient is not nervous/anxious and does not have insomnia.     Health Maintenance  Topic Date Due  . FOOT EXAM  06/02/2017  . INFLUENZA VACCINE  07/12/2017  . OPHTHALMOLOGY EXAM  10/12/2017  . HEMOGLOBIN A1C  01/10/2018  . TETANUS/TDAP  10/01/2026  . DEXA SCAN  Completed  . PNA vac Low Risk Adult  Completed    Physical Exam: Vitals:   07/13/17 0804  BP: 120/70  Pulse: 82  Temp: 97.7 F (36.5 C)  TempSrc: Oral  SpO2: 95%  Weight: 174 lb (78.9 kg)   Body mass index is 28.96 kg/m. Physical Exam  Constitutional: She  is oriented to person, place, and time. She appears well-developed and well-nourished. No distress.  HENT:  Head: Normocephalic and atraumatic.  Right Ear: External ear normal.  Left Ear: External ear normal.  Nose: Nose normal.  Mouth/Throat: Oropharyngeal exudate present.  Greenish post nasal drip  Eyes: Pupils are equal, round, and reactive to light. Conjunctivae and EOM are normal.  Neck: Normal range of motion. No JVD present. No thyromegaly present.  Cardiovascular: Normal rate, regular rhythm, normal heart sounds and intact distal pulses.   Pulmonary/Chest: Effort normal and breath sounds normal. No respiratory distress.  Abdominal: Bowel sounds are normal.  Musculoskeletal: Normal range of motion. She exhibits no tenderness.  Stooped posture, uses cane to ambulate, slow moving  Lymphadenopathy:    She has no cervical adenopathy.  Neurological: She is alert and oriented to person, place, and time.  Skin: Skin is warm and dry.  Psychiatric: She has a normal mood and affect.    Labs reviewed: Basic Metabolic Panel:  Recent Labs  10/03/16 1155 01/31/17 0939 07/10/17 0835  NA 144 142 142  K 4.3 4.6 4.0  CL 107 106 106  CO2 26 27 23   GLUCOSE 101* 109* 85  BUN 23 35* 24  CREATININE 1.49* 1.84* 1.53*  CALCIUM 9.2 9.4 8.7   Liver Function Tests:  Recent Labs  10/03/16 1155 01/31/17 0939  AST 20 20  ALT 14 11  ALKPHOS 79 73  BILITOT 0.4 0.5  PROT 6.9 6.5  ALBUMIN 3.6 3.6   No results for input(s): LIPASE, AMYLASE in the last 8760 hours. No results for input(s): AMMONIA in the last 8760 hours. CBC:  Recent Labs  01/31/17 0939 07/10/17 0835  WBC 7.3 7.9  NEUTROABS 4,234 4,266  HGB 10.8* 10.8*  HCT 34.1* 34.6*  MCV 87.0 87.6  PLT 192 186   Lipid Panel:  Recent Labs  01/31/17 0939 07/10/17 0835  CHOL 144 122  HDL 46* 30*  LDLCALC 72 55  TRIG 129 186*  CHOLHDL 3.1 4.1   Lab Results  Component Value Date   HGBA1C 6.6 (H) 07/10/2017    Assessment/Plan 1. DM type 2 with diabetic peripheral neuropathy (HCC) - well controlled, cont tradjenta, glipizide, statin, ace - Hemoglobin A1c; Future -saw podiatry for diabetic foot exam  2. controlled type 2 diabetes mellitus with stage 3 chronic kidney disease, without long-term current use of insulin ([place]) - see #1 - renal function improved this time with better hydration - Hemoglobin A1c; Future  3. Benign essential HTN - bp at goal with current therapy  - Basic metabolic panel; Future  4. Therapeutic opioid induced constipation -uses prn otc medications, but gets loose stools then so tries to regulate with diet and hydration which work better  5. Generalized OA -ongoing, progressive, having more difficulty walking long distances  6. Spinal stenosis, lumbar region, with neurogenic claudication -prescribed travel wheelchair for her b/c of weakness and difficulty walking long distances on her most recent trip  7. Mixed hyperlipidemia - cont pravachol - Lipid panel; Future  8. Viral upper respiratory tract infection with cough - manage conservatively with htn-cough syrup, hydration, rest - CBC with Differential/Platelet; Future  Labs/tests ordered:   Orders Placed This Encounter  Procedures  . CBC with Differential/Platelet    Standing Status:   Future    Standing Expiration Date:   03/13/2018  . Hemoglobin A1c    Standing Status:   Future    Standing Expiration Date:   03/13/2018  . Lipid panel    Standing Status:   Future    Standing Expiration Date:   03/13/2018    Order Specific Question:   Has the patient fasted?    Answer:   Yes  . Basic metabolic panel    Standing Status:   Future    Standing Expiration Date:   03/13/2018    Order Specific Question:   Has the patient fasted?    Answer:   Yes    Next appt:  11/16/2017  Rishon Thilges L. Tanyla Stege, D.O. [facility] [address]  [place], [place] [address] Cell Phone  (Mon-Fri 8am-5pm):  [phone] On Call:  [phone] & follow prompts after 5pm & weekends Office Phone:  [phone] Office Fax:  [phone]

## 2017-07-17 ENCOUNTER — Ambulatory Visit: Payer: Medicare Other | Admitting: Internal Medicine

## 2017-07-21 ENCOUNTER — Telehealth: Payer: Self-pay | Admitting: *Deleted

## 2017-07-21 DIAGNOSIS — M48062 Spinal stenosis, lumbar region with neurogenic claudication: Secondary | ICD-10-CM

## 2017-07-21 NOTE — Telephone Encounter (Signed)
Pt was giving a rx for a lightweight manual wheelchair and per pt [facility] stated she need a power wheelchair. I asked pt why and she states she can't walk very far and they suggested a power wheelchair. She gave me [phone] ext [phone] to call for more clarity.    Spoke to the supervisor Beth and she's not understanding who would have told the pt that but they don't have notes on any of this and asking that the original order be faxed to them at [phone].   You still want pt to have a lightweight manual wheelchair, correct?

## 2017-07-23 NOTE — Telephone Encounter (Signed)
We had only discussed the lightweight wheelchair for when she is traveling so I gave her the prescription for that.  She had just gone on a trip and found it hard to get around in the airport, for example.  There was no discussion of a power chair whatsoever, and, as you know, that requires a special appt and lots of documentation and help from [facility].  She ambulates with her walker for short distances.

## 2017-07-24 MED ORDER — OXYCODONE-ACETAMINOPHEN 5-325 MG PO TABS: 1.0000 | ORAL_TABLET | Freq: Three times a day (TID) | ORAL | 0 refills | Status: DC | PRN

## 2017-07-24 MED ORDER — GABAPENTIN 300 MG PO CAPS: ORAL_CAPSULE | ORAL | 3 refills | Status: DC

## 2017-07-24 NOTE — Telephone Encounter (Signed)
Spoke with patient and advised results, she really didn't understand why she couldn't get a power wheelchair, but after I explained it again she was ok.

## 2017-07-26 ENCOUNTER — Encounter: Payer: Self-pay | Admitting: Podiatry

## 2017-07-26 ENCOUNTER — Ambulatory Visit (INDEPENDENT_AMBULATORY_CARE_PROVIDER_SITE_OTHER): Payer: Medicare Other | Admitting: Podiatry

## 2017-07-26 DIAGNOSIS — M79609 Pain in unspecified limb: Secondary | ICD-10-CM | POA: Diagnosis not present

## 2017-07-26 DIAGNOSIS — E1142 Type 2 diabetes mellitus with diabetic polyneuropathy: Secondary | ICD-10-CM

## 2017-07-26 DIAGNOSIS — B351 Tinea unguium: Secondary | ICD-10-CM | POA: Diagnosis not present

## 2017-07-26 NOTE — Patient Instructions (Signed)

## 2017-07-26 NOTE — Progress Notes (Signed)
Patient ID: Dawn [name], female   DOB: January 02, 1928, 83 y.o.   MRN: [id number]    Subjective: This patient presents today complaining of painful toenails when walking wearing shoes and she is requesting nail debridement. Patient wearing diabetic shoes states that they're not as comfortable as her athletic style shoes, however, she wears a diabetic shoes on a daily basis.Patient is requesting a replacement diabetic shoes today  Objective: DP pulses 2/4 bilaterally PT pulses 1/4 bilaterally Capillary reflex immediate bilaterally Sensation to 10 g monofilament wire intact 4/5 bilaterally Vibratory sensation nonreactive bilaterally Ankle reflex equal and reactive bilaterally HAV left Hammertoe 2-5 bilaterally No open skin lesions bilaterally No inflammatory areas noted bilaterally The toenails are elongated, brittle, incurvated, deformed and tender to direct palpation 6-10 Atrophic skin with absent hair growth bilaterally  Assessment: Symptomatic onychomycoses 6-10 Diabetic peripheral neuropathy Hammertoe deformities 2-5 bilaterally  Plan: Debridement toenails 6-10 mechanically electronically without any bleeding Patient has received and is wearing diabetic shoes with custom insoles and comfort  Reappoint 3 months

## 2017-09-03 ENCOUNTER — Other Ambulatory Visit: Payer: Self-pay | Admitting: Internal Medicine

## 2017-09-03 DIAGNOSIS — E1121 Type 2 diabetes mellitus with diabetic nephropathy: Secondary | ICD-10-CM

## 2017-09-07 ENCOUNTER — Other Ambulatory Visit: Payer: Self-pay

## 2017-09-07 DIAGNOSIS — M48062 Spinal stenosis, lumbar region with neurogenic claudication: Secondary | ICD-10-CM

## 2017-09-07 MED ORDER — OXYCODONE-ACETAMINOPHEN 5-325 MG PO TABS: 1.0000 | ORAL_TABLET | Freq: Three times a day (TID) | ORAL | 0 refills | Status: DC | PRN

## 2017-09-07 NOTE — Telephone Encounter (Signed)
Patient called requesting refill on Oxycodone

## 2017-09-25 ENCOUNTER — Other Ambulatory Visit: Payer: Self-pay | Admitting: Internal Medicine

## 2017-10-23 ENCOUNTER — Ambulatory Visit (INDEPENDENT_AMBULATORY_CARE_PROVIDER_SITE_OTHER): Payer: Medicare Other | Admitting: Podiatry

## 2017-10-23 ENCOUNTER — Encounter: Payer: Self-pay | Admitting: Podiatry

## 2017-10-23 DIAGNOSIS — M79609 Pain in unspecified limb: Secondary | ICD-10-CM | POA: Diagnosis not present

## 2017-10-23 DIAGNOSIS — B351 Tinea unguium: Secondary | ICD-10-CM

## 2017-10-23 DIAGNOSIS — E1142 Type 2 diabetes mellitus with diabetic polyneuropathy: Secondary | ICD-10-CM | POA: Diagnosis not present

## 2017-10-23 NOTE — Patient Instructions (Signed)

## 2017-10-23 NOTE — Progress Notes (Signed)
Patient ID: Dawn [name], female   DOB: June 16, 1928, 83 y.o.   MRN: [id number]    Subjective: This patient presents today complaining of painful toenails when walking wearing shoes and she is requesting nail debridement. Patient wearing diabetic shoes states that they're not as comfortable as her athletic style shoes, however, she wears a diabetic shoes on a daily basis.Patient is requesting a replacement diabetic shoes today  Objective: DP pulses 2/4 bilaterally PT pulses 1/4 bilaterally Capillary reflex immediate bilaterally Sensation to 10 g monofilament wire intact 4/5 bilaterally Vibratory sensation nonreactive bilaterally Ankle reflex equal and reactive bilaterally HAV left Hammertoe 2-5 bilaterally No open skin lesions bilaterally No inflammatory areas noted bilaterally The toenails are elongated, brittle, incurvated, deformed and tender to direct palpation 6-10 Atrophic skin with absent hair growth bilaterally  Assessment: Symptomatic onychomycoses 6-10 Diabetic peripheral neuropathy Hammertoe deformities 2-5 bilaterally  Plan: Debridement toenails 6-10 mechanically electronically without any bleeding Patient has received and is wearing diabetic shoes with custom insoles which fit well and are comfortable  Reappoint 3 months

## 2017-10-26 ENCOUNTER — Other Ambulatory Visit: Payer: Self-pay | Admitting: *Deleted

## 2017-10-26 DIAGNOSIS — M48062 Spinal stenosis, lumbar region with neurogenic claudication: Secondary | ICD-10-CM

## 2017-10-26 MED ORDER — OXYCODONE-ACETAMINOPHEN 5-325 MG PO TABS
1.0000 | ORAL_TABLET | Freq: Three times a day (TID) | ORAL | 0 refills | Status: DC | PRN
Start: 2017-10-26 — End: 2017-12-11

## 2017-10-26 NOTE — Telephone Encounter (Signed)
Patient requested and will pick up [place] Database Verified.  

## 2017-10-27 LAB — HM DEXA SCAN

## 2017-11-07 ENCOUNTER — Encounter: Payer: Self-pay | Admitting: *Deleted

## 2017-11-13 ENCOUNTER — Telehealth: Payer: Self-pay | Admitting: *Deleted

## 2017-11-13 NOTE — Telephone Encounter (Signed)
Spoke with patient and advised results   Per Dr. Mariea Clonts " notify pt that she has osteopenia, decreased bone density, continue calcium with vit D and Vit D3 2000 units and weight bearing exercise"

## 2017-11-14 ENCOUNTER — Other Ambulatory Visit: Payer: Medicare Other

## 2017-11-14 DIAGNOSIS — J069 Acute upper respiratory infection, unspecified: Secondary | ICD-10-CM

## 2017-11-14 DIAGNOSIS — B9789 Other viral agents as the cause of diseases classified elsewhere: Principal | ICD-10-CM

## 2017-11-14 DIAGNOSIS — N183 Chronic kidney disease, stage 3 unspecified: Secondary | ICD-10-CM

## 2017-11-14 DIAGNOSIS — E1142 Type 2 diabetes mellitus with diabetic polyneuropathy: Secondary | ICD-10-CM

## 2017-11-14 DIAGNOSIS — I10 Essential (primary) hypertension: Secondary | ICD-10-CM

## 2017-11-14 DIAGNOSIS — E1122 Type 2 diabetes mellitus with diabetic chronic kidney disease: Secondary | ICD-10-CM

## 2017-11-14 DIAGNOSIS — E782 Mixed hyperlipidemia: Secondary | ICD-10-CM

## 2017-11-15 LAB — CBC WITH DIFFERENTIAL/PLATELET
Basophils Absolute: 38 cells/uL (ref 0–200)
Basophils Relative: 0.5 %
Eosinophils Absolute: 403 cells/uL (ref 15–500)
Eosinophils Relative: 5.3 %
HCT: 34.1 % — ABNORMAL LOW (ref 35.0–45.0)
Hemoglobin: 11.1 g/dL — ABNORMAL LOW (ref 11.7–15.5)
Lymphs Abs: 1687 cells/uL (ref 850–3900)
MCH: 27.4 pg (ref 27.0–33.0)
MCHC: 32.6 g/dL (ref 32.0–36.0)
MCV: 84.2 fL (ref 80.0–100.0)
MPV: 11.7 fL (ref 7.5–12.5)
Monocytes Relative: 7.4 %
Neutro Abs: 4910 cells/uL (ref 1500–7800)
Neutrophils Relative %: 64.6 %
Platelets: 137 10*3/uL — ABNORMAL LOW (ref 140–400)
RBC: 4.05 10*6/uL (ref 3.80–5.10)
RDW: 13.6 % (ref 11.0–15.0)
Total Lymphocyte: 22.2 %
WBC mixed population: 562 cells/uL (ref 200–950)
WBC: 7.6 10*3/uL (ref 3.8–10.8)

## 2017-11-15 LAB — HM DIABETES EYE EXAM

## 2017-11-15 LAB — LIPID PANEL
Cholesterol: 136 mg/dL (ref <200)
HDL: 51 mg/dL (ref >50)
LDL Cholesterol (Calc): 66 mg/dL (calc)
Non-HDL Cholesterol (Calc): 85 mg/dL (calc) (ref <130)
Total CHOL/HDL Ratio: 2.7 (calc) (ref <5.0)
Triglycerides: 105 mg/dL (ref <150)

## 2017-11-15 LAB — BASIC METABOLIC PANEL
BUN/Creatinine Ratio: 17 (calc) (ref 6–22)
BUN: 26 mg/dL — ABNORMAL HIGH (ref 7–25)
CO2: 28 mmol/L (ref 20–32)
Calcium: 9.6 mg/dL (ref 8.6–10.4)
Chloride: 106 mmol/L (ref 98–110)
Creat: 1.57 mg/dL — ABNORMAL HIGH (ref 0.60–0.88)
Glucose, Bld: 120 mg/dL — ABNORMAL HIGH (ref 65–99)
Potassium: 4.5 mmol/L (ref 3.5–5.3)
Sodium: 140 mmol/L (ref 135–146)

## 2017-11-15 LAB — HEMOGLOBIN A1C
Hgb A1c MFr Bld: 6.5 % of total Hgb — ABNORMAL HIGH (ref <5.7)
Mean Plasma Glucose: 140 (calc)
eAG (mmol/L): 7.7 (calc)

## 2017-11-16 ENCOUNTER — Encounter: Payer: Self-pay | Admitting: Internal Medicine

## 2017-11-16 ENCOUNTER — Ambulatory Visit (INDEPENDENT_AMBULATORY_CARE_PROVIDER_SITE_OTHER): Payer: Medicare Other | Admitting: Internal Medicine

## 2017-11-16 VITALS — BP 110/70 | HR 80 | Temp 97.3°F | Wt 176.0 lb

## 2017-11-16 DIAGNOSIS — K5903 Drug induced constipation: Secondary | ICD-10-CM | POA: Diagnosis not present

## 2017-11-16 DIAGNOSIS — M150 Primary generalized (osteo)arthritis: Secondary | ICD-10-CM

## 2017-11-16 DIAGNOSIS — M48062 Spinal stenosis, lumbar region with neurogenic claudication: Secondary | ICD-10-CM | POA: Diagnosis not present

## 2017-11-16 DIAGNOSIS — E1122 Type 2 diabetes mellitus with diabetic chronic kidney disease: Secondary | ICD-10-CM | POA: Diagnosis not present

## 2017-11-16 DIAGNOSIS — I10 Essential (primary) hypertension: Secondary | ICD-10-CM

## 2017-11-16 DIAGNOSIS — Z7189 Other specified counseling: Secondary | ICD-10-CM

## 2017-11-16 DIAGNOSIS — T402X5A Adverse effect of other opioids, initial encounter: Secondary | ICD-10-CM

## 2017-11-16 DIAGNOSIS — M8949 Other hypertrophic osteoarthropathy, multiple sites: Secondary | ICD-10-CM

## 2017-11-16 DIAGNOSIS — N183 Chronic kidney disease, stage 3 (moderate): Secondary | ICD-10-CM | POA: Diagnosis not present

## 2017-11-16 DIAGNOSIS — M159 Polyosteoarthritis, unspecified: Secondary | ICD-10-CM

## 2017-11-16 DIAGNOSIS — E1142 Type 2 diabetes mellitus with diabetic polyneuropathy: Secondary | ICD-10-CM | POA: Diagnosis not present

## 2017-11-16 NOTE — Patient Instructions (Signed)
Give some more thought to your code status and let me know what you decide.  If you would like a DNR form, I can complete it for you.

## 2017-11-16 NOTE — Progress Notes (Signed)
Location:  [facility] clinic Provider:  Krithi Bray L. Mariea Clonts, D.O., C.M.D.  Code Status: discussed code status, CPR with her and she wants to think about it and discuss it with her children  Goals of Care:  Advanced Directives 02/02/2017  Does Patient Have a Medical Advance Directive? Yes  Type of Paramedic of [place];Living will  Does patient want to make changes to medical advance directive? -  Copy of [place] in Chart? No - copy requested  Would patient like information on creating a medical advance directive? -   Chief Complaint  Patient presents with  . Medical Management of Chronic Issues    76mth follow-up, discuss labs    HPI: Patient is a 83 y.o. female seen today for medical management of chronic diseases.    Still having the same pains in her feet.  Saw podiatry in august and November.  She received her new diabetic shoes.  Takes the gabapentin 300mg  twice a day for the neuropathic pain.    Saw her eye doctor yesterday for her annual visit--no retinopathy noted.    DMII:  Control slightly improved since last visit with hba1c down to 6.5.  Anemia:  A bit better with h/h up into 11 range.    Hyperlipidemia:  All cholesterol values are improved.  She notes she is moving slower.  Says she guesses for her age, she is moving faster than many younger than her.  She does not do as much as she used to.   Likes to cook, but not doing that much anymore.  Buys prepared foods, eats cereal for breakfast.    Gets healthy choice or lean cuisine.  Tries to stay away from sweets.  Also avoids pork and beef.     A little pain in her one hip at night if she sleeps on it, but otherwise doing ok.  Only takes oxycodone/apap twice a day.    Past Medical History:  Diagnosis Date  . Anemia, unspecified   . Anorexia   . Chronic kidney disease   . Chronic kidney disease, stage III (moderate) (HCC)   . Cystocele, midline   . Disorder of bone and cartilage,  unspecified   . Kyphosis associated with other condition   . Obesity, unspecified   . Osteoarthrosis, unspecified whether generalized or localized, unspecified site   . Other and unspecified hyperlipidemia   . Other hammer toe (acquired)   . Postmenopausal atrophic vaginitis   . PVC's (premature ventricular contractions) 01/29/2015  . RBBB 01/29/2015  . Reflux esophagitis   . Type II or unspecified type diabetes mellitus without mention of complication, not stated as uncontrolled   . Unspecified constipation   . Unspecified disorder of kidney and ureter   . Unspecified essential hypertension   . Unspecified hereditary and idiopathic peripheral neuropathy   . Unspecified urinary incontinence     Past Surgical History:  Procedure Laterality Date  . BLADDER SURGERY     tie up in 2013  . EYE SURGERY  11/2016   Bilateral Cataract Sx  . TUBAL LIGATION      Allergies  Allergen Reactions  . Keflex [Cephalexin]   . Metronidazole   . Simvastatin     Leg cramsps    Outpatient Encounter Medications as of 11/16/2017  Medication Sig  . aspirin 81 MG tablet Take 81 mg by mouth daily.  . calcium-vitamin D (OSCAL WITH D) 500-200 MG-UNIT per tablet Take 1 tablet by mouth daily.  Marland Kitchen  cholecalciferol (VITAMIN D) 1000 UNITS tablet Take 1,000 Units by mouth daily.  Marland Kitchen desoximetasone (TOPICORT) 0.05 % cream Apply topically 2 (two) times daily.  Marland Kitchen gabapentin (NEURONTIN) 300 MG capsule Take one tablet by mouth in the morning and one tablet by mouth in the evening for pains  . glipiZIDE (GLUCOTROL) 5 MG tablet TAKE 1 TABLET DAILY  . glucose blood (FREESTYLE LITE) test strip Use to test blood sugar. Dx: E11.21  . Lancets (FREESTYLE) lancets Use to test blood sugar. Dx: E11.21  . lisinopril-hydrochlorothiazide (PRINZIDE,ZESTORETIC) 20-12.5 MG tablet TAKE 1 TABLET DAILY TO CONTROL BLOOD PRESSURE  . oxyCODONE-acetaminophen (ROXICET) 5-325 MG tablet Take 1 tablet every 8 (eight) hours as needed by mouth for  severe pain.  . pravastatin (PRAVACHOL) 20 MG tablet TAKE 1 TABLET WITH EVENING MEAL  . TRADJENTA 5 MG TABS tablet TAKE 1 TABLET DAILY  . vitamin E 400 UNIT capsule Take 400 Units by mouth daily.   No facility-administered encounter medications on file as of 11/16/2017.     Review of Systems:  Review of Systems  Constitutional: Positive for malaise/fatigue. Negative for chills and fever.  HENT: Negative for congestion.   Eyes: Negative for blurred vision.  Respiratory: Negative for cough and shortness of breath.   Cardiovascular: Negative for chest pain, palpitations and leg swelling.  Gastrointestinal: Positive for constipation. Negative for abdominal pain, blood in stool and melena.  Genitourinary: Negative for dysuria.  Musculoskeletal: Positive for back pain and joint pain. Negative for falls.  Skin: Negative for itching and rash.  Neurological: Negative for dizziness, loss of consciousness and weakness.  Endo/Heme/Allergies:       Diabetes  Psychiatric/Behavioral: Negative for depression and memory loss. The patient is not nervous/anxious.     Health Maintenance  Topic Date Due  . FOOT EXAM  06/02/2017  . INFLUENZA VACCINE  07/12/2017  . OPHTHALMOLOGY EXAM  10/12/2017  . HEMOGLOBIN A1C  05/15/2018  . TETANUS/TDAP  10/01/2026  . DEXA SCAN  Completed  . PNA vac Low Risk Adult  Completed    Physical Exam: Vitals:   11/16/17 1313  BP: 110/70  Pulse: 80  Temp: (!) 97.3 F (36.3 C)  TempSrc: Oral  SpO2: 97%  Weight: 176 lb (79.8 kg)   Body mass index is 29.29 kg/m. Physical Exam  Constitutional: She is oriented to person, place, and time. She appears well-developed and well-nourished. No distress.  HENT:  Head: Normocephalic and atraumatic.  Cardiovascular: Normal rate, regular rhythm, normal heart sounds and intact distal pulses.  Pulmonary/Chest: Effort normal and breath sounds normal. No respiratory distress.  Abdominal: Soft. Bowel sounds are normal.    Musculoskeletal: Normal range of motion.  Walks slower than before, some low back tenderness; uses walker  Neurological: She is alert and oriented to person, place, and time. No cranial nerve deficit.  Skin: Skin is warm and dry. Capillary refill takes less than 2 seconds.  Psychiatric: She has a normal mood and affect.    Labs reviewed: Basic Metabolic Panel: Recent Labs    01/31/17 0939 07/10/17 0835 11/14/17 1039  NA 142 142 140  K 4.6 4.0 4.5  CL 106 106 106  CO2 27 23 28   GLUCOSE 109* 85 120*  BUN 35* 24 26*  CREATININE 1.84* 1.53* 1.57*  CALCIUM 9.4 8.7 9.6   Liver Function Tests: Recent Labs    01/31/17 0939  AST 20  ALT 11  ALKPHOS 73  BILITOT 0.5  PROT 6.5  ALBUMIN 3.6   No  results for input(s): LIPASE, AMYLASE in the last 8760 hours. No results for input(s): AMMONIA in the last 8760 hours. CBC: Recent Labs    01/31/17 0939 07/10/17 0835 11/14/17 1039  WBC 7.3 7.9 7.6  NEUTROABS 4,234 4,266 4,910  HGB 10.8* 10.8* 11.1*  HCT 34.1* 34.6* 34.1*  MCV 87.0 87.6 84.2  PLT 192 186 137*   Lipid Panel: Recent Labs    01/31/17 0939 07/10/17 0835 11/14/17 1039  CHOL 144 122 136  HDL 46* 30* 51  LDLCALC 72 55  --   TRIG 129 186* 105  CHOLHDL 3.1 4.1 2.7   Lab Results  Component Value Date   HGBA1C 6.5 (H) 11/14/2017    Assessment/Plan 1. Benign essential HTN -bp at goal with current therapy, cont same regimen and monitor  2. Therapeutic opioid induced constipation -on percocet for back pain -cont home regimen otc and holistic approaches including hydration and fiber  3. DM type 2 with diabetic peripheral neuropathy ([place]) -well controlled, cont glipizide, tradjenta and pravachol, watch out for hypoglycemia  4. Type 2 diabetes mellitus with stage 3 chronic kidney disease, without long-term current use of insulin ([place]) -see #3, Avoid nephrotoxic agents like nsaids, dose adjust renally excreted meds, hydrate.  5. Primary osteoarthritis  involving multiple joints -cont percocet for severe pain, does not abuse  6. Spinal stenosis, lumbar region, with neurogenic claudication -cont gabapentin for neuropathic component and percocet for bony part, tolerating well  7.  Advance care planning -17 mins spent discussing CPR process, data about out of hospital arrests, pt's personal religious preferences, her current medical conditions, age, goals, pt thinks she would want to have a DNR form but wants to discuss with her children--she would not want to put them in a position where they had to make a difficult decision if she was on a machine that she would not survive if it were stopped -she will discuss with them and we will review this again next visit (included reminder in AVS)  Labs/tests ordered: No orders of the defined types were placed in this encounter.  Next appt:  02/08/2018   Saralee Bolick L. Lucinda Spells, D.O. [facility] [address], [place] [address] Cell Phone (Mon-Fri 8am-5pm):  [phone] On Call:  [phone] & follow prompts after 5pm & weekends Office Phone:  [phone] Office Fax:  [phone]

## 2017-11-20 DIAGNOSIS — Z7189 Other specified counseling: Secondary | ICD-10-CM | POA: Insufficient documentation

## 2017-11-24 ENCOUNTER — Encounter: Payer: Self-pay | Admitting: *Deleted

## 2017-12-11 ENCOUNTER — Other Ambulatory Visit: Payer: Self-pay | Admitting: Internal Medicine

## 2017-12-11 ENCOUNTER — Other Ambulatory Visit: Payer: Self-pay

## 2017-12-11 DIAGNOSIS — M48062 Spinal stenosis, lumbar region with neurogenic claudication: Secondary | ICD-10-CM

## 2017-12-11 MED ORDER — OXYCODONE-ACETAMINOPHEN 5-325 MG PO TABS: 1.0000 | ORAL_TABLET | Freq: Three times a day (TID) | ORAL | 0 refills | Status: DC | PRN

## 2017-12-11 NOTE — Telephone Encounter (Signed)
Patient called to request a refill of oxycodone/apap 5-325 mg tablets. [place] Database was checked for last fill date. Pharmacy was verified with patient.

## 2018-01-22 ENCOUNTER — Ambulatory Visit: Payer: Medicare Other | Admitting: Podiatry

## 2018-01-22 ENCOUNTER — Other Ambulatory Visit: Payer: Self-pay | Admitting: *Deleted

## 2018-01-22 DIAGNOSIS — M48062 Spinal stenosis, lumbar region with neurogenic claudication: Secondary | ICD-10-CM

## 2018-01-22 NOTE — Telephone Encounter (Signed)
Patient requested.  [place] [facility] Confirmed Pended Rx and sent to Dr. Mariea Clonts for approval.

## 2018-01-23 MED ORDER — OXYCODONE-ACETAMINOPHEN 5-325 MG PO TABS: 1.0000 | ORAL_TABLET | Freq: Three times a day (TID) | ORAL | 0 refills | Status: DC | PRN

## 2018-01-24 ENCOUNTER — Encounter: Payer: Self-pay | Admitting: Podiatry

## 2018-01-24 ENCOUNTER — Ambulatory Visit (INDEPENDENT_AMBULATORY_CARE_PROVIDER_SITE_OTHER): Payer: Medicare Other | Admitting: Podiatry

## 2018-01-24 DIAGNOSIS — M79609 Pain in unspecified limb: Secondary | ICD-10-CM | POA: Diagnosis not present

## 2018-01-24 DIAGNOSIS — B351 Tinea unguium: Secondary | ICD-10-CM

## 2018-01-25 NOTE — Progress Notes (Signed)
Subjective:   Patient ID: Dawn [name], female   DOB: 84 y.o.   MRN: [id number]   HPI Patient presents with thick incurvated nailbeds 1-5 both feet that she cannot cut and there is painful in shoe gear   ROS      Objective:  Physical Exam  Neurovascular status intact with thick yellow brittle nailbeds 1-5 both feet that are painful     Assessment:  Chronic mycotic nail infection with pain 1-5 both feet     Plan:  Debridement painful nailbeds 1-5 both feet with no iatrogenic bleeding noted

## 2018-02-08 ENCOUNTER — Ambulatory Visit (INDEPENDENT_AMBULATORY_CARE_PROVIDER_SITE_OTHER): Payer: Medicare Other | Admitting: Internal Medicine

## 2018-02-08 ENCOUNTER — Encounter: Payer: Self-pay | Admitting: Internal Medicine

## 2018-02-08 VITALS — BP 110/60 | HR 72 | Temp 97.7°F | Ht 65.0 in | Wt 176.0 lb

## 2018-02-08 DIAGNOSIS — M48062 Spinal stenosis, lumbar region with neurogenic claudication: Secondary | ICD-10-CM

## 2018-02-08 DIAGNOSIS — M150 Primary generalized (osteo)arthritis: Secondary | ICD-10-CM

## 2018-02-08 DIAGNOSIS — M8949 Other hypertrophic osteoarthropathy, multiple sites: Secondary | ICD-10-CM

## 2018-02-08 DIAGNOSIS — I10 Essential (primary) hypertension: Secondary | ICD-10-CM | POA: Diagnosis not present

## 2018-02-08 DIAGNOSIS — Z0000 Encounter for general adult medical examination without abnormal findings: Secondary | ICD-10-CM | POA: Diagnosis not present

## 2018-02-08 DIAGNOSIS — M159 Polyosteoarthritis, unspecified: Secondary | ICD-10-CM

## 2018-02-08 DIAGNOSIS — E1122 Type 2 diabetes mellitus with diabetic chronic kidney disease: Secondary | ICD-10-CM

## 2018-02-08 DIAGNOSIS — N183 Chronic kidney disease, stage 3 unspecified: Secondary | ICD-10-CM

## 2018-02-08 DIAGNOSIS — E1142 Type 2 diabetes mellitus with diabetic polyneuropathy: Secondary | ICD-10-CM

## 2018-02-08 MED ORDER — DESOXIMETASONE 0.05 % EX CREA: TOPICAL_CREAM | Freq: Two times a day (BID) | CUTANEOUS | 1 refills | Status: DC

## 2018-02-08 MED ORDER — FREESTYLE LANCETS MISC: 3 refills | Status: DC

## 2018-02-08 NOTE — Progress Notes (Signed)
Location:  [facility] clinic Provider: Yocelyn Brocious L. Mariea Clonts, D.O., C.M.D.  Patient Care Team: Gayland Curry, DO as PCP - General (Geriatric Medicine) Clent Jacks, MD as Consulting Physician (Ophthalmology)  Extended Emergency Contact Information Primary Emergency Contact: Mankey-Johnson,Lori Address: [address]          [place]nette [place] [employer] Phone: [phone] Mobile Phone: [phone] Relation: None  Code Status: full code; has living will and healthcare POA, but not on file Goals of Care: Advanced Directive information Advanced Directives 02/02/2017  Does Patient Have a Medical Advance Directive? Yes  Type of Paramedic of [place];Living will  Does patient want to make changes to medical advance directive? -  Copy of [place] in Chart? No - copy requested  Would patient like information on creating a medical advance directive? -     Chief Complaint  Patient presents with  . Medicare Wellness    wellness exam, MMSE 30/30, passed clock  . ACP    no ACP    HPI: Patient is a 84 y.o. [name] seen in today for an annual wellness exam.  She's doing fairly well.  Continues to have some aching in her back and fatigue with prolonged standing or walking and exercise is now walking around the grocery store 3 times once a week.  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    Depression screen [facility] 2/9 11/16/2017 07/13/2017 02/02/2017 10/03/2016 06/02/2016  Decreased Interest 0 0 0 0 0  Down, Depressed, Hopeless 0 0 0 0 0  PHQ - 2 Score 0 0 0 0 0    Fall Risk  11/16/2017 07/13/2017 02/02/2017 10/03/2016 06/02/2016  Falls in the past year? No No No No No  Number falls in past yr: - - - - -  Injury with Fall? - - - - -  [place] for fall due to : - - - - -  Follow up - - - - -   MMSE - [place] Exam 02/08/2018  02/02/2017 02/01/2016 01/06/2015  Orientation to time 5 5 5 5   Orientation to Place 5 5 5 5   Registration 3 3 3 3   Attention/ Calculation 5 5 5 5   Recall 3 3 3 2   Language- name 2 objects 2 2 2 2   Language- repeat 1 1 1 1   Language- follow 3 step command 3 3 3 3   Language- read & follow direction 1 1 1 1   Write a sentence 1 1 1 1   Copy design 1 1 1 1   Total score 30 30 30 29      Health Maintenance  Topic Date Due  . HEMOGLOBIN A1C  05/15/2018  . OPHTHALMOLOGY EXAM  11/15/2018  . FOOT EXAM  11/16/2018  . TETANUS/TDAP  10/01/2026  . INFLUENZA VACCINE  Completed  . DEXA SCAN  Completed  . PNA vac Low Risk Adult  Completed     Functional Status Survey: Is the patient deaf or have difficulty hearing?: Yes(has hearing aids, but doesn't wear them after one year) Does the patient have difficulty seeing, even when wearing glasses/contacts?: Yes(glasses, magnifiers for macular degeneration) Does the patient have difficulty concentrating, remembering, or making decisions?: No(some word finding difficulty) Does the patient have difficulty walking or climbing stairs?: Yes Does the patient have difficulty dressing or bathing?: Yes(fatigue when bathing, has to rest  between) Does the patient have difficulty doing errands alone such as visiting a doctor's office or shopping?: No(goes wherever she wants, but no over 2 hrs away) Current Exercise Habits: The patient does not participate in regular exercise at present(walking around the store only 2-3x) Exercise limited by: orthopedic condition(s)  Diet?  Low carb Vision: Follows with ophtho and had cataracts removed and lens implants bilaterally Hearing:  Some loss, but does not use her hearing aids Dentition:  Doing ok, no problems chewing or swallowing  Past Medical History:  Diagnosis Date  . Anemia, unspecified   . Anorexia   . Chronic kidney disease   . Chronic kidney disease, stage III (moderate) (HCC)   . Cystocele, midline   .  Disorder of bone and cartilage, unspecified   . Kyphosis associated with other condition   . Obesity, unspecified   . Osteoarthrosis, unspecified whether generalized or localized, unspecified site   . Other and unspecified hyperlipidemia   . Other hammer toe (acquired)   . Postmenopausal atrophic vaginitis   . PVC's (premature ventricular contractions) 01/29/2015  . RBBB 01/29/2015  . Reflux esophagitis   . Type II or unspecified type diabetes mellitus without mention of complication, not stated as uncontrolled   . Unspecified constipation   . Unspecified disorder of kidney and ureter   . Unspecified essential hypertension   . Unspecified hereditary and idiopathic peripheral neuropathy   . Unspecified urinary incontinence     Past Surgical History:  Procedure Laterality Date  . BLADDER SURGERY     tie up in 2013  . EYE SURGERY  11/2016   Bilateral Cataract Sx  . TUBAL LIGATION      Family History  Problem Relation Age of Onset  . Heart disease Mother   . Heart disease Sister   . Cancer Son   . Cancer Son     Social History   Socioeconomic History  . Marital status: Widowed    Spouse name: None  . Number of children: 5  . Years of education: 16  . Highest education level: Bachelor's degree (e.g., BA, AB, BS)  Social Needs  . Financial resource strain: Not hard at all  . Food insecurity - worry: Never true  . Food insecurity - inability: Never true  . Transportation needs - medical: No  . Transportation needs - non-medical: No  Occupational History  . Occupation: retired Occupational psychologist  Tobacco Use  . Smoking status: Former Smoker    Last attempt to quit: 12/12/1968    Years since quitting: 49.1  . Smokeless tobacco: Never Used  . Tobacco comment: quit in 1970  Substance and Sexual Activity  . Alcohol use: No    Alcohol/week: 0.0 oz  . Drug use: No  . Sexual activity: No  Other Topics Concern  . None  Social History Narrative   Widow - husband died  54   Lives one story house -son and grandson lives with her   Former smoker -stopped 1970   Alcohol none   Exercise -some walking   POA, Living Will    reports that she quit smoking about 49 years ago. she has never used smokeless tobacco. She reports that she does not drink alcohol or use drugs.  Allergies  Allergen Reactions  . Keflex [Cephalexin]   . Metronidazole   . Simvastatin     Leg cramsps    Outpatient Encounter Medications as of 02/08/2018  Medication Sig  . aspirin 81 MG tablet Take  81 mg by mouth daily.  . calcium-vitamin D (OSCAL WITH D) 500-200 MG-UNIT per tablet Take 1 tablet by mouth daily.  . cholecalciferol (VITAMIN D) 1000 UNITS tablet Take 1,000 Units by mouth daily.  Marland Kitchen desoximetasone (TOPICORT) 0.05 % cream Apply topically 2 (two) times daily.  Marland Kitchen gabapentin (NEURONTIN) 300 MG capsule Take one tablet by mouth in the morning and one tablet by mouth in the evening for pains  . glipiZIDE (GLUCOTROL) 5 MG tablet TAKE 1 TABLET DAILY  . glucose blood (FREESTYLE LITE) test strip Use to test blood sugar. Dx: E11.21  . Lancets (FREESTYLE) lancets Use to test blood sugar. Dx: E11.21  . lisinopril-hydrochlorothiazide (PRINZIDE,ZESTORETIC) 20-12.5 MG tablet TAKE 1 TABLET DAILY TO CONTROL BLOOD PRESSURE  . oxyCODONE-acetaminophen (ROXICET) 5-325 MG tablet Take 1 tablet by mouth every 8 (eight) hours as needed for severe pain.  . pravastatin (PRAVACHOL) 20 MG tablet TAKE 1 TABLET WITH EVENING MEAL  . TRADJENTA 5 MG TABS tablet TAKE 1 TABLET DAILY  . vitamin E 400 UNIT capsule Take 400 Units by mouth daily.  . [DISCONTINUED] desoximetasone (TOPICORT) 0.05 % cream Apply topically 2 (two) times daily.  . [DISCONTINUED] Lancets (FREESTYLE) lancets Use to test blood sugar. Dx: E11.21   No facility-administered encounter medications on file as of 02/08/2018.     Review of Systems:  ROS  Physical Exam: Vitals:   02/08/18 1341  BP: 110/60  Pulse: 72  Temp: 97.7 F (36.5  C)  TempSrc: Oral  SpO2: 95%  Weight: 176 lb (79.8 kg)  Height: 5\' 5"  (1.651 m)   Body mass index is 29.29 kg/m. Physical Exam  Constitutional: She is oriented to person, place, and time. She appears well-developed and well-nourished.  Increasingly frail  HENT:  Head: Normocephalic and atraumatic.  Right Ear: External ear normal.  Left Ear: External ear normal.  Nose: Nose normal.  Mouth/Throat: Oropharynx is clear and moist. No oropharyngeal exudate.  Eyes: Conjunctivae and EOM are normal. Pupils are equal, round, and reactive to light.  glasses  Neck: Normal range of motion. Neck supple. No JVD present. No thyromegaly present.  Cardiovascular: Normal rate, regular rhythm, normal heart sounds and intact distal pulses.  Pulmonary/Chest: Effort normal and breath sounds normal. No respiratory distress.  Abdominal: Soft. Bowel sounds are normal. She exhibits no distension and no mass. There is no tenderness. There is no guarding.  Musculoskeletal: Normal range of motion.  Kyphotic posture  Lymphadenopathy:    She has no cervical adenopathy.  Neurological: She is alert and oriented to person, place, and time. No cranial nerve deficit.  Skin: Skin is warm and dry. Capillary refill takes less than 2 seconds.  Psychiatric: She has a normal mood and affect.    Labs reviewed: Basic Metabolic Panel: Recent Labs    07/10/17 0835 11/14/17 1039  NA 142 140  K 4.0 4.5  CL 106 106  CO2 23 28  GLUCOSE 85 120*  BUN 24 26*  CREATININE 1.53* 1.57*  CALCIUM 8.7 9.6   Liver Function Tests: No results for input(s): AST, ALT, ALKPHOS, BILITOT, PROT, ALBUMIN in the last 8760 hours. No results for input(s): LIPASE, AMYLASE in the last 8760 hours. No results for input(s): AMMONIA in the last 8760 hours. CBC: Recent Labs    07/10/17 0835 11/14/17 1039  WBC 7.9 7.6  NEUTROABS 4,266 4,910  HGB 10.8* 11.1*  HCT 34.6* 34.1*  MCV 87.6 84.2  PLT 186 137*   Lipid Panel: Recent Labs  07/10/17 0835 11/14/17 1039  CHOL 122 136  HDL 30* Dawn  LDLCALC 55  --   TRIG 186* 105  CHOLHDL 4.1 2.7   Lab Results  Component Value Date   HGBA1C 6.5 (H) 11/14/2017    Assessment/Plan 1. Medicare annual wellness visit, subsequent -see flow sheets and above  2. Benign essential HTN -bp at goal with current regimen - EKG 12-Lead with rBBB   3. DM type 2 with diabetic peripheral neuropathy (HCC) -controlled with current regimen, cont same Avoid nephrotoxic agents like nsaids, dose adjust renally excreted meds, hydrate.  4. Type 2 diabetes mellitus with stage 3 chronic kidney disease, without long-term current use of insulin (HCC) -controlled with current regimen, cont same  5. Primary osteoarthritis involving multiple joints -ongoing, uses percocet for severe pain and rests, as this worsens, she's considering getting her a power chair to get around--she'll let us know if she wants an appt for mobility assessment  6. Spinal stenosis, lumbar region, with neurogenic claudication -also progressing, limits her activity some and having to use percocet up to three times a day when more active  Labs/tests ordered:   Orders Placed This Encounter  Procedures  . CBC with Differential/Platelet    Standing Status:   Future    Standing Expiration Date:   10/08/2018  . COMPLETE METABOLIC PANEL WITH GFR    Standing Status:   Future    Standing Expiration Date:   10/08/2018  . Hemoglobin A1c    Standing Status:   Future    Standing Expiration Date:   10/08/2018  . EKG 12-Lead    Next appt:  06/11/2018 med mgt, labs before  Germaine Ripp L. Francoise Chojnowski, D.O. [facility] [address], [place] [address] Cell Phone (Mon-Fri 8am-5pm):  [phone] On Call:  [phone] & follow prompts after 5pm & weekends Office Phone:  [phone] Office Fax:  [phone]

## 2018-02-13 ENCOUNTER — Other Ambulatory Visit: Payer: Self-pay | Admitting: *Deleted

## 2018-02-13 MED ORDER — DESOXIMETASONE 0.05 % EX CREA: TOPICAL_CREAM | Freq: Two times a day (BID) | CUTANEOUS | 1 refills | Status: DC

## 2018-02-13 NOTE — Telephone Encounter (Signed)
[facility] 

## 2018-03-06 ENCOUNTER — Other Ambulatory Visit: Payer: Self-pay | Admitting: *Deleted

## 2018-03-06 DIAGNOSIS — M48062 Spinal stenosis, lumbar region with neurogenic claudication: Secondary | ICD-10-CM

## 2018-03-06 MED ORDER — OXYCODONE-ACETAMINOPHEN 5-325 MG PO TABS: 1.0000 | ORAL_TABLET | Freq: Three times a day (TID) | ORAL | 0 refills | Status: DC | PRN

## 2018-03-06 NOTE — Telephone Encounter (Signed)
Patient called requesting pain medication [place] Database Verified LR: 01/23/2018 Pharmacy Confirmed Pended Rx and sent to Dr. Mariea Clonts for approval

## 2018-03-17 ENCOUNTER — Other Ambulatory Visit: Payer: Self-pay | Admitting: Internal Medicine

## 2018-03-17 DIAGNOSIS — E1121 Type 2 diabetes mellitus with diabetic nephropathy: Secondary | ICD-10-CM

## 2018-03-23 ENCOUNTER — Other Ambulatory Visit: Payer: Self-pay | Admitting: Internal Medicine

## 2018-03-23 DIAGNOSIS — E1121 Type 2 diabetes mellitus with diabetic nephropathy: Secondary | ICD-10-CM

## 2018-04-23 ENCOUNTER — Other Ambulatory Visit: Payer: Medicare Other

## 2018-04-24 ENCOUNTER — Other Ambulatory Visit: Payer: Medicare Other

## 2018-04-24 ENCOUNTER — Other Ambulatory Visit: Payer: Self-pay | Admitting: *Deleted

## 2018-04-24 DIAGNOSIS — M48062 Spinal stenosis, lumbar region with neurogenic claudication: Secondary | ICD-10-CM

## 2018-04-24 NOTE — Telephone Encounter (Signed)
Received a call from Mrs. Heitman stating that she needs a refill on Oxycodone. [place] Database verified that the last fill date was 03/06/18. Please advise.

## 2018-04-24 NOTE — Telephone Encounter (Signed)
Patient requested Refill [place] Verified LR: 03/06/2018 Pharmacy confirmed Pended Rx and sent to Dr. Mariea Clonts for approval.

## 2018-04-25 MED ORDER — OXYCODONE-ACETAMINOPHEN 5-325 MG PO TABS: 1.0000 | ORAL_TABLET | Freq: Three times a day (TID) | ORAL | 0 refills | Status: DC | PRN

## 2018-04-27 ENCOUNTER — Other Ambulatory Visit: Payer: Medicare Other

## 2018-04-30 ENCOUNTER — Ambulatory Visit (INDEPENDENT_AMBULATORY_CARE_PROVIDER_SITE_OTHER): Payer: Medicare Other | Admitting: Podiatry

## 2018-04-30 DIAGNOSIS — M79609 Pain in unspecified limb: Secondary | ICD-10-CM | POA: Diagnosis not present

## 2018-04-30 DIAGNOSIS — B351 Tinea unguium: Secondary | ICD-10-CM | POA: Diagnosis not present

## 2018-04-30 DIAGNOSIS — M2042 Other hammer toe(s) (acquired), left foot: Secondary | ICD-10-CM

## 2018-04-30 DIAGNOSIS — E1121 Type 2 diabetes mellitus with diabetic nephropathy: Secondary | ICD-10-CM | POA: Diagnosis not present

## 2018-04-30 DIAGNOSIS — L84 Corns and callosities: Secondary | ICD-10-CM

## 2018-04-30 DIAGNOSIS — M2041 Other hammer toe(s) (acquired), right foot: Secondary | ICD-10-CM

## 2018-04-30 DIAGNOSIS — E1142 Type 2 diabetes mellitus with diabetic polyneuropathy: Secondary | ICD-10-CM

## 2018-05-02 NOTE — Progress Notes (Signed)
Subjective:   Patient ID: Dawn [name], female   DOB: 84 y.o.   MRN: [id number]   HPI Patient presents with elongated nailbeds 1-5 both feet that are thick yellow brittle and hard for the patient to wear shoe gear with   ROS      Objective:  Physical Exam  Neurovascular status intact with thick yellow brittle nailbeds 1-5 both feet      Assessment:  Mycotic nail infection 1-5 both feet     Plan:  Debride painful nailbeds 1-5 both feet with no iatrogenic bleeding noted

## 2018-05-04 ENCOUNTER — Other Ambulatory Visit: Payer: Self-pay | Admitting: Internal Medicine

## 2018-06-08 ENCOUNTER — Other Ambulatory Visit: Payer: Self-pay | Admitting: *Deleted

## 2018-06-08 ENCOUNTER — Other Ambulatory Visit: Payer: Medicare Other

## 2018-06-08 DIAGNOSIS — N183 Chronic kidney disease, stage 3 (moderate): Secondary | ICD-10-CM

## 2018-06-08 DIAGNOSIS — M48062 Spinal stenosis, lumbar region with neurogenic claudication: Secondary | ICD-10-CM

## 2018-06-08 DIAGNOSIS — E1142 Type 2 diabetes mellitus with diabetic polyneuropathy: Secondary | ICD-10-CM

## 2018-06-08 DIAGNOSIS — E1122 Type 2 diabetes mellitus with diabetic chronic kidney disease: Secondary | ICD-10-CM

## 2018-06-08 MED ORDER — OXYCODONE-ACETAMINOPHEN 5-325 MG PO TABS: 1.0000 | ORAL_TABLET | Freq: Three times a day (TID) | ORAL | 0 refills | Status: DC | PRN

## 2018-06-08 NOTE — Telephone Encounter (Signed)
Database checked and verified Last filled 04/25/2018

## 2018-06-09 LAB — COMPLETE METABOLIC PANEL WITH GFR
AG Ratio: 1.3 (calc) (ref 1.0–2.5)
ALT: 11 U/L (ref 6–29)
AST: 16 U/L (ref 10–35)
Albumin: 3.7 g/dL (ref 3.6–5.1)
Alkaline phosphatase (APISO): 78 U/L (ref 33–130)
BUN/Creatinine Ratio: 16 (calc) (ref 6–22)
BUN: 29 mg/dL — ABNORMAL HIGH (ref 7–25)
CO2: 27 mmol/L (ref 20–32)
Calcium: 9.4 mg/dL (ref 8.6–10.4)
Chloride: 106 mmol/L (ref 98–110)
Creat: 1.83 mg/dL — ABNORMAL HIGH (ref 0.60–0.88)
GFR, Est African American: 28 mL/min/{1.73_m2} — ABNORMAL LOW (ref >=60)
GFR, Est Non African American: 24 mL/min/{1.73_m2} — ABNORMAL LOW (ref >=60)
Globulin: 2.9 g/dL (calc) (ref 1.9–3.7)
Glucose, Bld: 123 mg/dL — ABNORMAL HIGH (ref 65–99)
Potassium: 4.2 mmol/L (ref 3.5–5.3)
Sodium: 142 mmol/L (ref 135–146)
Total Bilirubin: 0.4 mg/dL (ref 0.2–1.2)
Total Protein: 6.6 g/dL (ref 6.1–8.1)

## 2018-06-09 LAB — CBC WITH DIFFERENTIAL/PLATELET
Basophils Absolute: 73 cells/uL (ref 0–200)
Basophils Relative: 1 %
Eosinophils Absolute: 380 cells/uL (ref 15–500)
Eosinophils Relative: 5.2 %
HCT: 33.8 % — ABNORMAL LOW (ref 35.0–45.0)
Hemoglobin: 10.8 g/dL — ABNORMAL LOW (ref 11.7–15.5)
Lymphs Abs: 1803 cells/uL (ref 850–3900)
MCH: 27.8 pg (ref 27.0–33.0)
MCHC: 32 g/dL (ref 32.0–36.0)
MCV: 86.9 fL (ref 80.0–100.0)
MPV: 11.3 fL (ref 7.5–12.5)
Monocytes Relative: 8.5 %
Neutro Abs: 4424 cells/uL (ref 1500–7800)
Neutrophils Relative %: 60.6 %
Platelets: 141 10*3/uL (ref 140–400)
RBC: 3.89 10*6/uL (ref 3.80–5.10)
RDW: 14.1 % (ref 11.0–15.0)
Total Lymphocyte: 24.7 %
WBC mixed population: 621 cells/uL (ref 200–950)
WBC: 7.3 10*3/uL (ref 3.8–10.8)

## 2018-06-09 LAB — HEMOGLOBIN A1C
Hgb A1c MFr Bld: 6.3 % of total Hgb — ABNORMAL HIGH (ref <5.7)
Mean Plasma Glucose: 134 (calc)
eAG (mmol/L): 7.4 (calc)

## 2018-06-11 ENCOUNTER — Ambulatory Visit (INDEPENDENT_AMBULATORY_CARE_PROVIDER_SITE_OTHER): Payer: Medicare Other | Admitting: Internal Medicine

## 2018-06-11 ENCOUNTER — Encounter: Payer: Self-pay | Admitting: Internal Medicine

## 2018-06-11 VITALS — BP 122/70 | HR 76 | Temp 98.1°F | Ht 65.0 in | Wt 174.0 lb

## 2018-06-11 DIAGNOSIS — R2681 Unsteadiness on feet: Secondary | ICD-10-CM

## 2018-06-11 DIAGNOSIS — M159 Polyosteoarthritis, unspecified: Secondary | ICD-10-CM | POA: Diagnosis not present

## 2018-06-11 DIAGNOSIS — K8689 Other specified diseases of pancreas: Secondary | ICD-10-CM

## 2018-06-11 DIAGNOSIS — E1122 Type 2 diabetes mellitus with diabetic chronic kidney disease: Secondary | ICD-10-CM | POA: Diagnosis not present

## 2018-06-11 DIAGNOSIS — R197 Diarrhea, unspecified: Secondary | ICD-10-CM | POA: Diagnosis not present

## 2018-06-11 DIAGNOSIS — N184 Chronic kidney disease, stage 4 (severe): Secondary | ICD-10-CM | POA: Diagnosis not present

## 2018-06-11 DIAGNOSIS — E1142 Type 2 diabetes mellitus with diabetic polyneuropathy: Secondary | ICD-10-CM | POA: Diagnosis not present

## 2018-06-11 MED ORDER — PANCRELIPASE (LIP-PROT-AMYL) 36000-114000 UNITS PO CPEP
36000.0000 [IU] | ORAL_CAPSULE | Freq: Three times a day (TID) | ORAL | 3 refills | Status: DC
Start: 2018-06-11 — End: 2018-06-11

## 2018-06-11 MED ORDER — PANCRELIPASE (LIP-PROT-AMYL) 36000-114000 UNITS PO CPEP: 36000.0000 [IU] | ORAL_CAPSULE | Freq: Three times a day (TID) | ORAL | 3 refills | Status: DC

## 2018-06-11 NOTE — Progress Notes (Signed)
Location:  [facility] clinic Provider:  Niyonna Betsill L. Mariea Clonts, D.O., C.M.D.  Goals of Care:  Advanced Directives 02/02/2017  Does Patient Have a Medical Advance Directive? Yes  Type of Paramedic of [place];Living will  Does patient want to make changes to medical advance directive? -  Copy of [place] in Chart? No - copy requested  Would patient like information on creating a medical advance directive? -   Chief Complaint  Patient presents with  . Medical Management of Chronic Issues    68mth follow-up, diarrhea comes and goes    HPI: Patient is a 84 y.o. female seen today for medical management of chronic diseases.    She continues to have diarrhea that comes and goes.  She's had two bms this morning.   Not taking any medications to manage her bowels.  Coming and going over couple days at a time.  She is having regular bms in between.  Watery bowels.  She can't smell to tell if it's a particularly bad smell.  No blood seen.  It's a brown color.  No pain, but irritates her bottom.  The off and on is for a couple of months.  Has had some incontinence--a little bit, not a full bm.  This morning's bm was right after a meal, but she's not sure if that's always the case (had not paid attention to correlation before this).  Appetite good.  Weight stable in the mid-170s.    Had two sons with cancer, but type she does not recall.  She is slowing down and wonders about getting another assistive device that she could use.  She is going on a trip the last of the month. She is using her cane more than last time.  She's not as steady. Uses the cane all of the time outside of her home for balance.  No back pain.    Reviewed labs with her. Discussed worsening renal function.  Encouraged hydration.  Tries to drink extra water with her pills.  Not really getting more in with her diarrhea.  Past Medical History:  Diagnosis Date  . Anemia, unspecified   . Anorexia   .  Chronic kidney disease   . Chronic kidney disease, stage III (moderate) (HCC)   . Cystocele, midline   . Disorder of bone and cartilage, unspecified   . Kyphosis associated with other condition   . Obesity, unspecified   . Osteoarthrosis, unspecified whether generalized or localized, unspecified site   . Other and unspecified hyperlipidemia   . Other hammer toe (acquired)   . Postmenopausal atrophic vaginitis   . PVC's (premature ventricular contractions) 01/29/2015  . RBBB 01/29/2015  . Reflux esophagitis   . Type II or unspecified type diabetes mellitus without mention of complication, not stated as uncontrolled   . Unspecified constipation   . Unspecified disorder of kidney and ureter   . Unspecified essential hypertension   . Unspecified hereditary and idiopathic peripheral neuropathy   . Unspecified urinary incontinence     Past Surgical History:  Procedure Laterality Date  . BLADDER SURGERY     tie up in 2013  . EYE SURGERY  11/2016   Bilateral Cataract Sx  . TUBAL LIGATION      Allergies  Allergen Reactions  . Keflex [Cephalexin]   . Metronidazole   . Simvastatin     Leg cramsps    Outpatient Encounter Medications as of 06/11/2018  Medication Sig  . aspirin 81 MG tablet  Take 81 mg by mouth daily.  . calcium-vitamin D (OSCAL WITH D) 500-200 MG-UNIT per tablet Take 1 tablet by mouth daily.  . cholecalciferol (VITAMIN D) 1000 UNITS tablet Take 1,000 Units by mouth daily.  Marland Kitchen desoximetasone (TOPICORT) 0.05 % cream Apply topically 2 (two) times daily.  Marland Kitchen gabapentin (NEURONTIN) 300 MG capsule Take one tablet by mouth in the morning and one tablet by mouth in the evening for pains  . glipiZIDE (GLUCOTROL) 5 MG tablet TAKE 1 TABLET DAILY  . glucose blood (FREESTYLE LITE) test strip Use to test blood sugar. Dx: E11.21  . Lancets (FREESTYLE) lancets Use to test blood sugar. Dx: E11.21  . lisinopril-hydrochlorothiazide (PRINZIDE,ZESTORETIC) 20-12.5 MG tablet TAKE 1 TABLET DAILY  TO CONTROL BLOOD PRESSURE  . oxyCODONE-acetaminophen (ROXICET) 5-325 MG tablet Take 1 tablet by mouth every 8 (eight) hours as needed for severe pain.  . pravastatin (PRAVACHOL) 20 MG tablet TAKE 1 TABLET WITH EVENING MEAL  . TRADJENTA 5 MG TABS tablet TAKE 1 TABLET DAILY  . vitamin E 400 UNIT capsule Take 400 Units by mouth daily.   No facility-administered encounter medications on file as of 06/11/2018.     Review of Systems:  Review of Systems  Constitutional: Positive for malaise/fatigue. Negative for chills and fever.  Eyes:       Glasses  Respiratory: Negative for cough and shortness of breath.   Cardiovascular: Negative for chest pain, palpitations and leg swelling.  Gastrointestinal: Positive for diarrhea. Negative for abdominal pain, blood in stool, constipation and melena.  Musculoskeletal: Positive for joint pain. Negative for falls.       Walking with cane, less steady and moving slower  Neurological: Negative for dizziness and loss of consciousness.  Endo/Heme/Allergies: Bruises/bleeds easily.  Psychiatric/Behavioral: Negative for memory loss.    Health Maintenance  Topic Date Due  . INFLUENZA VACCINE  07/12/2018  . OPHTHALMOLOGY EXAM  11/15/2018  . FOOT EXAM  11/16/2018  . HEMOGLOBIN A1C  12/08/2018  . TETANUS/TDAP  10/01/2026  . DEXA SCAN  Completed  . PNA vac Low Risk Adult  Completed    Physical Exam: Vitals:   06/11/18 1303  BP: 122/70  Pulse: 76  Temp: 98.1 F (36.7 C)  TempSrc: Oral  SpO2: 96%  Weight: 174 lb (78.9 kg)  Height: 5\' 5"  (1.651 m)   Body mass index is 28.96 kg/m. Physical Exam  Constitutional: She is oriented to person, place, and time. She appears well-developed and well-nourished. No distress.  HENT:  Head: Normocephalic and atraumatic.  Cardiovascular: Normal rate, regular rhythm, normal heart sounds and intact distal pulses.  Pulmonary/Chest: Effort normal and breath sounds normal. No respiratory distress.  Abdominal: Soft. She  exhibits no distension and no mass. There is no tenderness. There is no guarding.  Hyperactive BS  Genitourinary: Rectal exam shows guaiac negative stool.  Musculoskeletal: Normal range of motion.  Walking with cane for balance  Neurological: She is alert and oriented to person, place, and time.  Skin: Skin is warm and dry. Capillary refill takes less than 2 seconds.  Psychiatric: She has a normal mood and affect.    Labs reviewed: Basic Metabolic Panel: Recent Labs    07/10/17 0835 11/14/17 1039 06/08/18 0909  NA 142 140 142  K 4.0 4.5 4.2  CL 106 106 106  CO2 23 28 27   GLUCOSE 85 120* 123*  BUN 24 26* 29*  CREATININE 1.53* 1.57* 1.83*  CALCIUM 8.7 9.6 9.4   Liver Function Tests: Recent Labs  06/08/18 0909  AST 16  ALT 11  BILITOT 0.4  PROT 6.6   No results for input(s): LIPASE, AMYLASE in the last 8760 hours. No results for input(s): AMMONIA in the last 8760 hours. CBC: Recent Labs    07/10/17 0835 11/14/17 1039 06/08/18 0909  WBC 7.9 7.6 7.3  NEUTROABS 4,266 4,910 4,424  HGB 10.8* 11.1* 10.8*  HCT 34.6* 34.1* 33.8*  MCV 87.6 84.2 86.9  PLT 186 137* 141   Lipid Panel: Recent Labs    07/10/17 0835 11/14/17 1039  CHOL 122 136  HDL 30* 51  LDLCALC 55 66  TRIG 186* 105  CHOLHDL 4.1 2.7   Lab Results  Component Value Date   HGBA1C 6.3 (H) 06/08/2018    Assessment/Plan 1. Unsteady gait -worsening over time; has neuropathy and lumbar spinal stenosis - Ambulatory referral to Physical Therapy for balance exercises, strengthening and evaluation for the best assistive device for her travel plans (?rollator)  2. DM type 2 with diabetic peripheral neuropathy (HCC) -well controlled  3. Type 2 diabetes mellitus with stage 4 chronic kidney disease, without long-term current use of insulin (HCC) -well controlled with this current regimen  4. Generalized OA -knees, hands - Ambulatory referral to Physical Therapy as above  5. Chronic kidney disease,  stage 4 (severe) (HCC) -progressing, suspect somewhat due to volume depletion from frequent loose stools  6. Diarrhea, unspecified type - suspect due to #7, was heme negative (done due to change in bowel habits for her) - lipase/protease/amylase (CREON) 36000 UNITS CPEP capsule; Take 1 capsule (36,000 Units total) by mouth 3 (three) times daily with meals. And snacks  Dispense: 270 capsule; Refill: 3  7. Pancreatic insufficiency - try creon with meals and snacks for next couple of weeks then f/u with me to see if better; if not, plan on GI referral - lipase/protease/amylase (CREON) 36000 UNITS CPEP capsule; Take 1 capsule (36,000 Units total) by mouth 3 (three) times daily with meals. And snacks  Dispense: 270 capsule; Refill: 3  Labs/tests ordered:   Orders Placed This Encounter  Procedures  . Ambulatory referral to Physical Therapy    Referral Priority:   Routine    Referral Type:   Physical Medicine    Referral Reason:   Specialty Services Required    Requested Specialty:   Physical Therapy    Number of Visits Requested:   1   Next appt:  07/02/2018 f/u on bowels  Takashi Korol L. Amoura Ransier, D.O. [facility] [address], [place] [address] Cell Phone (Mon-Fri 8am-5pm):  [phone] On Call:  [phone] & follow prompts after 5pm & weekends Office Phone:  [phone] Office Fax:  [phone]

## 2018-06-11 NOTE — Patient Instructions (Signed)
Start taking creon one capsule with each meal or snack.  This should bulk up your stools.

## 2018-06-26 ENCOUNTER — Telehealth: Payer: Self-pay | Admitting: *Deleted

## 2018-06-26 NOTE — Telephone Encounter (Signed)
I called [facility] to check the status of referral, I was told they are behind on referrals (short-staffed). It was recommended that I call the patient and give her the number to outpatient rehab center [phone].  I called patient and gave her the number as advised to schedule PT appointment with outpatient rehab.  Patient then stated someone was suppose to call her to schedule a 2 week follow-up or check status of how she is doing on creon  I reviewed last ov note and patient was instructed to schedule a 2 week follow-up which she scheduled on her way out at last appointment. Patient had no recollection that she had schedule appointment.   Patient was given appointment date/time and plans to be seen at future appointment with Dr.Reed

## 2018-06-26 NOTE — Telephone Encounter (Signed)
Patient is calling requesting status on referral. Stated that she called last week and was told it was being worked. Patient is concerned because she hasn't heard anything yet and she was in office 06/11/18. Status is Pending Review.  Please Advise.

## 2018-07-02 ENCOUNTER — Ambulatory Visit: Payer: Medicare Other | Attending: Internal Medicine | Admitting: Physical Therapy

## 2018-07-02 ENCOUNTER — Encounter: Payer: Self-pay | Admitting: Internal Medicine

## 2018-07-02 ENCOUNTER — Ambulatory Visit (INDEPENDENT_AMBULATORY_CARE_PROVIDER_SITE_OTHER): Payer: Medicare Other | Admitting: Internal Medicine

## 2018-07-02 VITALS — BP 130/80 | HR 76 | Temp 97.4°F | Ht 65.0 in | Wt 172.0 lb

## 2018-07-02 DIAGNOSIS — R2681 Unsteadiness on feet: Secondary | ICD-10-CM

## 2018-07-02 DIAGNOSIS — E1122 Type 2 diabetes mellitus with diabetic chronic kidney disease: Secondary | ICD-10-CM | POA: Diagnosis not present

## 2018-07-02 DIAGNOSIS — N184 Chronic kidney disease, stage 4 (severe): Secondary | ICD-10-CM | POA: Diagnosis not present

## 2018-07-02 DIAGNOSIS — R197 Diarrhea, unspecified: Secondary | ICD-10-CM

## 2018-07-02 DIAGNOSIS — M6281 Muscle weakness (generalized): Secondary | ICD-10-CM | POA: Insufficient documentation

## 2018-07-02 DIAGNOSIS — R2689 Other abnormalities of gait and mobility: Secondary | ICD-10-CM | POA: Insufficient documentation

## 2018-07-02 NOTE — Therapy (Signed)
[facility] [address] [place], [place], [address] Phone: [phone]   Fax:  [phone]  Physical Therapy Evaluation  Patient Details  Name: Dawn [name] MRN: [id number] Date of Birth: [birth year] Referring Provider: Hollace Kinnier   Encounter Date: 07/02/2018  PT End of Session - 07/02/18 0906    Visit Number  1    Number of Visits  10    Date for PT Re-Evaluation  08/31/18    Authorization Type  Medicare and Tricare for Life-will require 10th visit progress note    PT Start Time  0803    PT Stop Time  0848    PT Time Calculation (min)  45 min    Activity Tolerance  Patient tolerated treatment well    Behavior During Therapy  [facility] for tasks assessed/performed       Past Medical History:  Diagnosis Date  . Anemia, unspecified   . Anorexia   . Chronic kidney disease   . Chronic kidney disease, stage III (moderate) (HCC)   . Cystocele, midline   . Disorder of bone and cartilage, unspecified   . Kyphosis associated with other condition   . Obesity, unspecified   . Osteoarthrosis, unspecified whether generalized or localized, unspecified site   . Other and unspecified hyperlipidemia   . Other hammer toe (acquired)   . Postmenopausal atrophic vaginitis   . PVC's (premature ventricular contractions) 01/29/2015  . RBBB 01/29/2015  . Reflux esophagitis   . Type II or unspecified type diabetes mellitus without mention of complication, not stated as uncontrolled   . Unspecified constipation   . Unspecified disorder of kidney and ureter   . Unspecified essential hypertension   . Unspecified hereditary and idiopathic peripheral neuropathy   . Unspecified urinary incontinence     Past Surgical History:  Procedure Laterality Date  . BLADDER SURGERY     tie up in 2013  . EYE SURGERY  11/2016   Bilateral Cataract Sx  . TUBAL LIGATION      There were no vitals filed for this visit.   Subjective Assessment -  07/02/18 0806    Subjective  Pt reports she may need a walker for better mobility when she is travelling.  Over time, I feel my balance has changed.  No falls.  She uses a cane, and has used for about 6 months.    Patient Stated Goals  To look into a walker for traveling and to stay independent    Currently in Pain?  No/denies         [facility] PT Assessment - 07/02/18 6256      Assessment   Medical Diagnosis  Unsteady gait    Referring Provider  Tiffany Reed    Onset Date/Surgical Date  06/11/18 MD visit      Precautions   Precautions  Fall      Balance Screen   Has the patient fallen in the past 6 months  No    Has the patient had a decrease in activity level because of a fear of falling?   Yes    Is the patient reluctant to leave their home because of a fear of falling?   No      Home Environment   Living Environment  Private residence    Living Arrangements  Children Son lives with her    Home Access  Stairs to enter she goes in back which is level entry    Technical brewer of Steps  2    Entrance Stairs-Rails  Right;Left;Can reach both    Home Layout  One level Basement, but doesn't go down    [employer] - single point small quad tip rubber base      Prior Function   Level of Independence  Independent    Leisure  Enjoys traveling (going on a trip to [place] later in July)      Observation/Other Assessments   Focus on Therapeutic Outcomes (FOTO)   NA      Posture/Postural Control   Posture/Postural Control  Postural limitations    Postural Limitations  Forward head;Rounded Shoulders      ROM / Strength   AROM / PROM / Strength  Strength      Strength   Overall Strength Comments  Grossly tested hip flexion, quads, hamstrings at least 4+/5; ankle dorsiflexion bilaterally 4/5      Transfers   Transfers  Sit to Stand;Stand to Sit    Sit to Stand  6: Modified independent (Device/Increase time);With upper extremity assist;With armrests;From  chair/3-in-1 definite use of UE support    Stand to Sit  6: Modified independent (Device/Increase time);With upper extremity assist;With armrests;To chair/3-in-1      Ambulation/Gait   Ambulation/Gait  Yes    Ambulation/Gait Assistance  5: Supervision    Ambulation/Gait Assistance Details  Slowed gait pattern    Ambulation Distance (Feet)  180 Feet    Assistive device  Straight cane;Rolling walker;Rollator with small quad rubber tip    Gait Pattern  Step-through pattern;Decreased step length - right;Decreased step length - left;Decreased dorsiflexion - right;Decreased dorsiflexion - left;Narrow base of support forward flexed head-looks at ground with gait    Gait velocity  47.31 sec with cane (0.69 ft/sec) 48.13 sec RW (0.68 ft/sec); 40.17 rollator (0.82 ft/sec)    Gait Comments  Trialed RW and rollator walker during evaluation-see gt velocities above      Standardized Balance Assessment   Standardized Balance Assessment  Timed Up and Go Test      Timed Up and Go Test   Normal TUG (seconds)  54.41 with cane    TUG Comments  Scores >13.5 sec indicate increased fall risk; scores >30 seconds indicate increased difficulty with ADLs in the home.                Objective measurements completed on examination: See above findings.            Self Care:  Began fall prevention education-discussed need for plenty of light, awareness of surroundings to avoid obstacles, use of appropriate device for safety. Provided list of DME providers in the area-[facility], [facility], [place]  PT Education - 07/02/18 [phone]    Education Details  Benefits/limitiations of RW and rollator; pt feels most comfortable with rollator; discussed use and safety with rollator, locking brakes, sit<>stand, how to obtain, including giving order to patient for MD to sign    Person(s) Educated  Patient    Methods  Explanation;Demonstration;Handout     Comprehension  Verbalized understanding;Returned demonstration          PT Long Term Goals - 07/02/18 0913      PT LONG TERM GOAL #1   Title  Pt will be independent with HEP for improved balance and gait.  TARGET 08/03/18    Time  5    Period  Weeks    Status  New    Target Date  08/03/18  PT LONG TERM GOAL #2   Title  Pt will improve gait velocity to at least 1.3 ft/sec for improved gait efficiency and safety.    Time  5    Period  Weeks    Status  New    Target Date  08/03/18      PT LONG TERM GOAL #3   Title  Pt will improve TUG score to less than or equal to 40 seconds for decreased fall risk.    Time  5    Period  Weeks    Status  New    Target Date  08/03/18      PT LONG TERM GOAL #4   Title  Berg Balance score to be completed, and goal written as appropriate.    Time  5    Period  Weeks    Status  New    Target Date  08/03/18      PT LONG TERM GOAL #5   Title  Pt will verbalize understanding of fall prevention in home environment.    Time  5    Period  Weeks    Status  New    Target Date  08/03/18      Additional Long Term Goals   Additional Long Term Goals  Yes      PT LONG TERM GOAL #6   Title  Pt will ambulate at least 500 ft, indoor and outdoor distances, modified independently with rollator, for improved community gait.    Time  5    Period  Weeks    Status  New    Target Date  08/03/18             Plan - 07/02/18 0907    Clinical Impression Statement  Pt is an 84 year old female who presents to OP PT for balance, unsteady gait, and recommendations on appropriate walker for upcoming trip/long distance walking.  Pt presents with decreased strength, abnormal posture, decreased balance, decreased safety and independence with gait.  Pt is at fall risk per gait velocity of 0.69 ft/sec and per TUG score of 54.41 sec.  Pt would benefit from skilled PT to address the above stated deficits to decrease fall risk and improve functional mobility and  independence.  Trialed rolling walker and rollator walker, with pt having improvement in gait velocity with rollator and noting improved ease of movement with rollator; order requested for rollator for patient (written order given to patient who has MD visit later today).    History and Personal Factors relevant to plan of care:  PMH > 3 co-morbidities, slowed mobility    Clinical Presentation  Stable    Clinical Presentation due to:  fall risk per TUG and gait velocity scores    Clinical Decision Making  Low    Rehab Potential  Good    PT Frequency  2x / week 1x/wk (eval week), then 2x/wk 4 weeks    PT Duration  Other (comment) plus eval week (5 weeks total POC)    PT Treatment/Interventions  ADLs/Self Care Home Management;DME Instruction;Balance training;Therapeutic exercise;Therapeutic activities;Functional mobility training;Gait training;Neuromuscular re-education;Patient/family education    PT Next Visit Plan  Berg Balance test; initiate HEP, any further rollator walker training (pt should have obtained her own); complete fall prevention education    Consulted and Agree with Plan of Care  Patient       Patient will benefit from skilled therapeutic intervention in order to improve the following deficits and impairments:  Abnormal  gait, Decreased balance, Decreased mobility, Decreased strength, Difficulty walking, Postural dysfunction  Visit Diagnosis: Other abnormalities of gait and mobility  Unsteadiness on feet  Muscle weakness (generalized)     Problem List Patient Active Problem List   Diagnosis Date Noted  . Advance care planning 11/20/2017  . PVC's (premature ventricular contractions) 01/29/2015  . RBBB 01/29/2015  . Hyperlipidemia 01/06/2015  . Osteopenia 01/06/2015  . Benign essential HTN 07/29/2014  . Weight gain 07/29/2014  . Therapeutic opioid induced constipation 07/29/2014  . Spinal stenosis, lumbar region, with neurogenic claudication 04/30/2014  . Neuropathic  pain 03/05/2014  . Bilateral leg pain 02/05/2014  . DM type 2 with diabetic peripheral neuropathy ([place]) 02/05/2014  . Unspecified constipation 01/08/2014  . Leg pain 01/08/2014  . Routine general medical examination at a health care facility 01/08/2014  . DM type 2, uncontrolled, with renal complications ([place]) 18/86/7737  . Hypertensive renal disease 10/08/2013  . Need for prophylactic vaccination and inoculation against influenza 10/08/2013  . Generalized OA 07/23/2013  . UTI (urinary tract infection) 06/19/2013  . Hypoglycemia 06/19/2013  . Diabetes mellitus with nephropathy ([place]) 03/21/2013  . Osteoarthritis   . Other and unspecified hyperlipidemia   . Chronic kidney disease     Min Collymore W. 07/02/2018, 9:17 AM Frazier Butt., PT  [facility] [address] [place], [place], [address] Phone: [phone]   Fax:  [phone]  Name: Blia Totman MRN: [id number] Date of Birth: 1928/01/22

## 2018-07-02 NOTE — Progress Notes (Signed)
Location:  [facility] clinic Provider:  Conn Trombetta L. Mariea Clonts, D.O., C.M.D.  Goals of Care:  Advanced Directives 07/02/2018  Does Patient Have a Medical Advance Directive? Yes  Type of [place]amed[place] of [place];Living will  Does patient want to make changes to medical advance directive? -  Copy of [place] in Chart? -  Would patient like information on creating a medical advance directive? -     Chief Complaint  Patient presents with  . Follow-up    2week follow-up on bowels    HPI: Patient is a 84 y.o. female seen today for f/u on loose stools.  I had started her on creon at her last visit for suspected pancreatic insufficiency.    She also brought a prescription from [facility] for a 4-wheeled rollator walker.  I completed this.   Even when she took three a day, she still had loose stools--not as loose,more bulky.  BMs are still more often than they should be.   With two capsules per day, she's still going 2-3 times per day.  Hasn't had a day w/o a bm so far.  She was going more when she was on 3 capsules per day.  Weight is trending down.  She had heme-negative stool at the last appointment.  I explained that each capsule should be with a meal, but it sounds like doing that did not solve the problem anyway. No more incontinence since last visit.  Past Medical History:  Diagnosis Date  . Anemia, unspecified   . Anorexia   . Chronic kidney disease   . Chronic kidney disease, stage III (moderate) (HCC)   . Cystocele, midline   . Disorder of bone and cartilage, unspecified   . Kyphosis associated with other condition   . Obesity, unspecified   . Osteoarthrosis, unspecified whether generalized or localized, unspecified site   . Other and unspecified hyperlipidemia   . Other hammer toe (acquired)   . Postmenopausal atrophic vaginitis   . PVC's (premature ventricular contractions) 01/29/2015  . RBBB 01/29/2015  . Reflux esophagitis   . Type II  or unspecified type diabetes mellitus without mention of complication, not stated as uncontrolled   . Unspecified constipation   . Unspecified disorder of kidney and ureter   . Unspecified essential hypertension   . Unspecified hereditary and idiopathic peripheral neuropathy   . Unspecified urinary incontinence     Past Surgical History:  Procedure Laterality Date  . BLADDER SURGERY     tie up in 2013  . EYE SURGERY  11/2016   Bilateral Cataract Sx  . TUBAL LIGATION      Allergies  Allergen Reactions  . Keflex [Cephalexin]   . Metronidazole   . Simvastatin     Leg cramsps    Outpatient Encounter Medications as of 07/02/2018  Medication Sig  . aspirin 81 MG tablet Take 81 mg by mouth daily.  . calcium-vitamin D (OSCAL WITH D) 500-200 MG-UNIT per tablet Take 1 tablet by mouth daily.  . cholecalciferol (VITAMIN D) 1000 UNITS tablet Take 1,000 Units by mouth daily.  Marland Kitchen desoximetasone (TOPICORT) 0.05 % cream Apply topically 2 (two) times daily.  Marland Kitchen gabapentin (NEURONTIN) 300 MG capsule Take one tablet by mouth in the morning and one tablet by mouth in the evening for pains  . glipiZIDE (GLUCOTROL) 5 MG tablet TAKE 1 TABLET DAILY  . glucose blood (FREESTYLE LITE) test strip Use to test blood sugar. Dx: E11.21  . Lancets (FREESTYLE) lancets  Use to test blood sugar. Dx: E11.21  . lipase/protease/amylase (CREON) 36000 UNITS CPEP capsule Take 1 capsule (36,000 Units total) by mouth 3 (three) times daily with meals. And snacks  . lisinopril-hydrochlorothiazide (PRINZIDE,ZESTORETIC) 20-12.5 MG tablet TAKE 1 TABLET DAILY TO CONTROL BLOOD PRESSURE  . oxyCODONE-acetaminophen (ROXICET) 5-325 MG tablet Take 1 tablet by mouth every 8 (eight) hours as needed for severe pain.  . pravastatin (PRAVACHOL) 20 MG tablet TAKE 1 TABLET WITH EVENING MEAL  . TRADJENTA 5 MG TABS tablet TAKE 1 TABLET DAILY  . vitamin E 400 UNIT capsule Take 400 Units by mouth daily.   No facility-administered encounter  medications on file as of 07/02/2018.     Review of Systems:  Review of Systems  Constitutional: Negative for chills and fever.  Eyes: Negative for blurred vision.  Respiratory: Negative for shortness of breath.   Cardiovascular: Negative for chest pain, palpitations and leg swelling.  Gastrointestinal: Positive for diarrhea. Negative for abdominal pain, blood in stool, constipation, heartburn, nausea and vomiting.  Genitourinary: Negative for dysuria.  Musculoskeletal: Positive for back pain. Negative for falls.  Neurological: Negative for dizziness.  Endo/Heme/Allergies: Does not bruise/bleed easily.  Psychiatric/Behavioral: Negative for memory loss.    Health Maintenance  Topic Date Due  . INFLUENZA VACCINE  07/12/2018  . OPHTHALMOLOGY EXAM  11/15/2018  . FOOT EXAM  11/16/2018  . HEMOGLOBIN A1C  12/08/2018  . TETANUS/TDAP  10/01/2026  . DEXA SCAN  Completed  . PNA vac Low Risk Adult  Completed    Physical Exam: Vitals:   07/02/18 1110  BP: 130/80  Pulse: 76  Temp: (!) 97.4 F (36.3 C)  TempSrc: Oral  SpO2: 96%  Weight: 172 lb (78 kg)  Height: 5\' 5"  (1.651 m)   Body mass index is 28.62 kg/m. Physical Exam  Constitutional: She is oriented to person, place, and time. She appears well-developed. No distress.  HENT:  Head: Normocephalic and atraumatic.  Eyes:  glasses  Cardiovascular: Normal rate, regular rhythm, normal heart sounds and intact distal pulses.  Abdominal: Soft. Bowel sounds are normal. She exhibits no distension. There is no tenderness.  Musculoskeletal:  Unsteady gait with cane (getting walker)  Neurological: She is alert and oriented to person, place, and time.  Skin: Skin is warm and dry.  Psychiatric: She has a normal mood and affect.    Labs reviewed: Basic Metabolic Panel: Recent Labs    07/10/17 0835 11/14/17 1039 06/08/18 0909  NA 142 140 142  K 4.0 4.5 4.2  CL 106 106 106  CO2 23 28 27   GLUCOSE 85 120* 123*  BUN 24 26* 29*    CREATININE 1.53* 1.57* 1.83*  CALCIUM 8.7 9.6 9.4   Liver Function Tests: Recent Labs    06/08/18 0909  AST 16  ALT 11  BILITOT 0.4  PROT 6.6   No results for input(s): LIPASE, AMYLASE in the last 8760 hours. No results for input(s): AMMONIA in the last 8760 hours. CBC: Recent Labs    07/10/17 0835 11/14/17 1039 06/08/18 0909  WBC 7.9 7.6 7.3  NEUTROABS 4,266 4,910 4,424  HGB 10.8* 11.1* 10.8*  HCT 34.6* 34.1* 33.8*  MCV 87.6 84.2 86.9  PLT 186 137* 141   Lipid Panel: Recent Labs    07/10/17 0835 11/14/17 1039  CHOL 122 136  HDL 30* 51  LDLCALC 55 66  TRIG 186* 105  CHOLHDL 4.1 2.7   Lab Results  Component Value Date   HGBA1C 6.3 (H) 06/08/2018  Assessment/Plan 1. Diarrhea, unspecified type - frequent loose bms--still going 3-4 times per day even with creon with meals - I had suspected pancreatic insufficiency based on fatty appearance to stools and her longstanding DM - the stools are bulkier than they were, but still too frequent and pt losing weight which worries me - Ambulatory referral to Gastroenterology to evaluate change in bowel habits -hemoccult negative 2 weeks ago  2. Type 2 diabetes mellitus with stage 4 chronic kidney disease, without long-term current use of insulin ([place]) - well controlled, but has had diabetes for many years putting her at higher risk for pancreatic insufficiency, but has not responded that well to creon  - CBC with Differential/Platelet; Future - COMPLETE METABOLIC PANEL WITH GFR; Future - Hemoglobin A1c; Future  3. Unsteady gait -persists, getting therapy at neurorehab and I filled in a Rx for her rollator walker with the therapy information  Labs/tests ordered:   Orders Placed This Encounter  Procedures  . CBC with Differential/Platelet    Standing Status:   Future    Standing Expiration Date:   03/03/2019  . COMPLETE METABOLIC PANEL WITH GFR    Standing Status:   Future    Standing Expiration Date:   03/03/2019   . Hemoglobin A1c    Standing Status:   Future    Standing Expiration Date:   03/03/2019  . Ambulatory referral to Gastroenterology    Referral Priority:   Routine    Referral Type:   Consultation    Referral Reason:   Specialty Services Required    Number of Visits Requested:   1   Next appt:  11/15/2018 med mgt, labs before   [place]. Clare Casto, D.O. [facility] [address], [place] [address] Cell Phone (Mon-Fri 8am-5pm):  [phone] On Call:  [phone] & follow prompts after 5pm & weekends Office Phone:  [phone] Office Fax:  [phone]

## 2018-07-02 NOTE — Patient Instructions (Signed)
Medical supply stores: [facility] on [address] [phone]  [facility] on [address] or in [place] [phone] for both locations  [facility] on [address] [phone] 

## 2018-07-03 ENCOUNTER — Encounter: Payer: Self-pay | Admitting: Gastroenterology

## 2018-07-23 ENCOUNTER — Encounter: Payer: Self-pay | Admitting: Physical Therapy

## 2018-07-23 ENCOUNTER — Ambulatory Visit: Payer: Medicare Other | Attending: Internal Medicine | Admitting: Physical Therapy

## 2018-07-23 DIAGNOSIS — M6281 Muscle weakness (generalized): Secondary | ICD-10-CM | POA: Diagnosis present

## 2018-07-23 DIAGNOSIS — R2681 Unsteadiness on feet: Secondary | ICD-10-CM | POA: Diagnosis not present

## 2018-07-23 DIAGNOSIS — R2689 Other abnormalities of gait and mobility: Secondary | ICD-10-CM | POA: Diagnosis present

## 2018-07-23 NOTE — Therapy (Signed)
[facility] [address] [place], [place], [address] Phone: [phone]   Fax:  [phone]  Physical Therapy Treatment  Patient Details  Name: Dawn [name] MRN: [id number] Date of Birth: 1928-08-01 Referring Provider: Hollace Kinnier   Encounter Date: 07/23/2018  PT End of Session - 07/23/18 1318    Visit Number  2    Number of Visits  10    Date for PT Re-Evaluation  08/31/18    Authorization Type  Medicare and Tricare for Life-will require 10th visit progress note    PT Start Time  7824    PT Stop Time  1315    PT Time Calculation (min)  40 min    Activity Tolerance  Patient tolerated treatment well    Behavior During Therapy  [facility] for tasks assessed/performed       Past Medical History:  Diagnosis Date  . Anemia, unspecified   . Anorexia   . Chronic kidney disease   . Chronic kidney disease, stage III (moderate) (HCC)   . Cystocele, midline   . Disorder of bone and cartilage, unspecified   . Kyphosis associated with other condition   . Obesity, unspecified   . Osteoarthrosis, unspecified whether generalized or localized, unspecified site   . Other and unspecified hyperlipidemia   . Other hammer toe (acquired)   . Postmenopausal atrophic vaginitis   . PVC's (premature ventricular contractions) 01/29/2015  . RBBB 01/29/2015  . Reflux esophagitis   . Type II or unspecified type diabetes mellitus without mention of complication, not stated as uncontrolled   . Unspecified constipation   . Unspecified disorder of kidney and ureter   . Unspecified essential hypertension   . Unspecified hereditary and idiopathic peripheral neuropathy   . Unspecified urinary incontinence     Past Surgical History:  Procedure Laterality Date  . BLADDER SURGERY     tie up in 2013  . EYE SURGERY  11/2016   Bilateral Cataract Sx  . TUBAL LIGATION      There were no vitals filed for this visit.  Subjective Assessment - 07/23/18  1239    Subjective  Went on the trip and had a lot of help-no problems.  Been trying to get used to the walker.  Use it when I go out of the house.    Patient Stated Goals  To look into a walker for traveling and to stay independent    Currently in Pain?  No/denies                       [facility] Adult PT Treatment/Exercise - 07/23/18 1247      Ambulation/Gait   Ambulation/Gait  Yes    Ambulation/Gait Assistance  5: Supervision    Ambulation/Gait Assistance Details  Cues provided for upright posture, staying close to rollator, for locking and unlocking brakes and full turn to sit.    Ambulation Distance (Feet)  60 Feet   120   Assistive device  Rollator    Gait Pattern  Step-through pattern;Decreased step length - right;Decreased step length - left;Decreased dorsiflexion - right;Decreased dorsiflexion - left;Narrow base of support      Posture/Postural Control   Posture Comments  Cues throughout standing exercises and gait for upright posture, looking ahead      Standardized Balance Assessment   Standardized Balance Assessment  Berg Balance Test   Berg score 26/56 (Scores <45/56 indicate fall risk)     [employer]  Sit to Stand  Able to stand  independently using hands    Standing Unsupported  Able to stand 2 minutes with supervision    Sitting with Back Unsupported but Feet Supported on Floor or Stool  Able to sit safely and securely 2 minutes    Stand to Sit  Controls descent by using hands    Transfers  Able to transfer with verbal cueing and /or supervision    Standing Unsupported with Eyes Closed  Able to stand 10 seconds with supervision    Standing Ubsupported with Feet Together  Needs help to attain position but able to stand for 30 seconds with feet together    From Standing, Reach Forward with Outstretched Arm  Can reach forward >12 cm safely (5")    From Standing Position, Pick up Object from Floor  Unable to try/needs assist to keep balance    From  Standing Position, Turn to Look Behind Over each Shoulder  Turn sideways only but maintains balance    Turn 360 Degrees  Needs assistance while turning    Standing Unsupported, Alternately Place Feet on Step/Stool  Needs assistance to keep from falling or unable to try    Standing Unsupported, One Foot in Front  Able to take small step independently and hold 30 seconds    Standing on One Leg  Unable to try or needs assist to prevent fall    Total Score  26      High Level Balance   High Level Balance Comments  alternating hip kicks side, x 10 reps, hip kicks alternating back x 8 reps      Self-Care   Self-Care  Other Self-Care Comments    Other Self-Care Comments   Fall prevention education provided; discussed Merrilee Jansky Balance score as fall risk and recommendation to use rollator based on Berg score of 26/56.      Exercises   Exercises  Ankle;Knee/Hip      Knee/Hip Exercises: Seated   Long Arc Quad  Strengthening;Right;Left;1 set;10 reps    Marching  Strengthening;Right;Left;1 set;10 reps      Ankle Exercises: Seated   Ankle Circles/Pumps  Strengthening;Right;Left;10 reps   Ankle pumps            PT Education - 07/23/18 1317    Education Details  Fall prevention education; fall risk based on Berg score and recommendation to use rollator walker    Person(s) Educated  Patient    Methods  Explanation;Demonstration;Handout    Comprehension  Verbalized understanding;Returned demonstration          PT Long Term Goals - 07/02/18 0913      PT LONG TERM GOAL #1   Title  Pt will be independent with HEP for improved balance and gait.  TARGET 08/03/18    Time  5    Period  Weeks    Status  New    Target Date  08/03/18      PT LONG TERM GOAL #2   Title  Pt will improve gait velocity to at least 1.3 ft/sec for improved gait efficiency and safety.    Time  5    Period  Weeks    Status  New    Target Date  08/03/18      PT LONG TERM GOAL #3   Title  Pt will improve TUG score  to less than or equal to 40 seconds for decreased fall risk.    Time  5    Period  Weeks  Status  New    Target Date  08/03/18      PT LONG TERM GOAL #4   Title  Berg Balance score to be completed, and goal written as appropriate.    Time  5    Period  Weeks    Status  New    Target Date  08/03/18      PT LONG TERM GOAL #5   Title  Pt will verbalize understanding of fall prevention in home environment.    Time  5    Period  Weeks    Status  New    Target Date  08/03/18      Additional Long Term Goals   Additional Long Term Goals  Yes      PT LONG TERM GOAL #6   Title  Pt will ambulate at least 500 ft, indoor and outdoor distances, modified independently with rollator, for improved community gait.    Time  5    Period  Weeks    Status  New    Target Date  08/03/18            Plan - 07/23/18 1318    Clinical Impression Statement  Pt has been out of town with family trip and returns today for first visit following evaluation.  Completed Berg Balance score, with score 26/56 indicating fall risk  and indicating use of rollator for optimal safety with gait.  Initiated standing balance exercises, with pt needing several rest breaks during standing exercises and Berg balance test.  Pt will benefit from continued skilled PT to address balance, strength, and gait.    Rehab Potential  Good    PT Frequency  2x / week   1x/wk (eval week), then 2x/wk 4 weeks   PT Duration  Other (comment)   plus eval week (5 weeks total POC)   PT Treatment/Interventions  ADLs/Self Care Home Management;DME Instruction;Balance training;Therapeutic exercise;Therapeutic activities;Functional mobility training;Gait training;Neuromuscular re-education;Patient/family education    PT Next Visit Plan  initiate HEP-seated lower extremity strengthening, standing balance exercises    Consulted and Agree with Plan of Care  Patient       Patient will benefit from skilled therapeutic intervention in order to  improve the following deficits and impairments:  Abnormal gait, Decreased balance, Decreased mobility, Decreased strength, Difficulty walking, Postural dysfunction  Visit Diagnosis: Unsteadiness on feet  Other abnormalities of gait and mobility     Problem List Patient Active Problem List   Diagnosis Date Noted  . Advance care planning 11/20/2017  . PVC's (premature ventricular contractions) 01/29/2015  . RBBB 01/29/2015  . Hyperlipidemia 01/06/2015  . Osteopenia 01/06/2015  . Benign essential HTN 07/29/2014  . Weight gain 07/29/2014  . Therapeutic opioid induced constipation 07/29/2014  . Spinal stenosis, lumbar region, with neurogenic claudication 04/30/2014  . Neuropathic pain 03/05/2014  . Bilateral leg pain 02/05/2014  . DM type 2 with diabetic peripheral neuropathy ([place]) 02/05/2014  . Unspecified constipation 01/08/2014  . Leg pain 01/08/2014  . Routine general medical examination at a health care facility 01/08/2014  . DM type 2, uncontrolled, with renal complications ([place]) 41/66/0630  . Hypertensive renal disease 10/08/2013  . Need for prophylactic vaccination and inoculation against influenza 10/08/2013  . Generalized OA 07/23/2013  . UTI (urinary tract infection) 06/19/2013  . Hypoglycemia 06/19/2013  . Diabetes mellitus with nephropathy ([place]) 03/21/2013  . Osteoarthritis   . Other and unspecified hyperlipidemia   . Chronic kidney disease     Dawn [name] W. 07/23/2018,  1:22 PM  Frazier Butt., PT   [facility] [address] [place], [place], [address] Phone: [phone]   Fax:  [phone]  Name: Dawn [name] MRN: [id number] Date of Birth: March 18, 1928

## 2018-07-23 NOTE — Patient Instructions (Signed)

## 2018-07-25 ENCOUNTER — Ambulatory Visit: Payer: Medicare Other | Admitting: Physical Therapy

## 2018-07-25 ENCOUNTER — Encounter: Payer: Self-pay | Admitting: Physical Therapy

## 2018-07-25 DIAGNOSIS — M6281 Muscle weakness (generalized): Secondary | ICD-10-CM

## 2018-07-25 DIAGNOSIS — R2681 Unsteadiness on feet: Secondary | ICD-10-CM | POA: Diagnosis not present

## 2018-07-25 NOTE — Patient Instructions (Addendum)
HIP: Abduction - Standing    Squeeze glutes (bottom cheeks). Raise leg out to the side, then bring to the floor.  Alternate legs, kicking to the side. _5-10__ reps per set, _2__ sets per day, _5__ days per week Hold onto the support of the counter.  Copyright  VHI. All rights reserved.  Hip Extension, Knee Straight    Stand facing the counter, holding on for support.  Move right straight leg back, then bring into starting position.  Alternate kicking legs in backward direction. Repeat __5-10__ times per session. Do __5_ sessions per week.  Copyright  VHI. All rights reserved.  PRE GAIT: Marching    Stand beside the counter and March in place by lifting left leg, then right. Alternate. _10__ reps per set, _1-2__ sets per day, __5_ days per week .  Copyright  VHI. All rights reserved.  Side-Stepping    Face the counter and hold on for support.  Walk to left side with eyes open. Take even steps, leading with same foot. Make sure each foot lifts off the floor. Repeat in opposite direction. Repeat for _3___ times each direction, length of counter, per session. Do _1-2___ sessions per day.   Copyright  VHI. All rights reserved.

## 2018-07-26 NOTE — Therapy (Signed)
[facility] [address] [place], [place], [address] Phone: [phone]   Fax:  [phone]  Physical Therapy Treatment  Patient Details  Name: Dawn [name] MRN: [id number] Date of Birth: 01/04/1928 Referring Provider: Hollace Kinnier   Encounter Date: 07/25/2018  PT End of Session - 07/26/18 0805    Visit Number  3    Number of Visits  10    Date for PT Re-Evaluation  08/31/18    Authorization Type  Medicare and Tricare for Life-will require 10th visit progress note    PT Start Time  1024    PT Stop Time  1102    PT Time Calculation (min)  38 min    Activity Tolerance  Patient tolerated treatment well   Requires several seated rest breaks during standing exercises   Behavior During Therapy  [facility] for tasks assessed/performed       Past Medical History:  Diagnosis Date  . Anemia, unspecified   . Anorexia   . Chronic kidney disease   . Chronic kidney disease, stage III (moderate) (HCC)   . Cystocele, midline   . Disorder of bone and cartilage, unspecified   . Kyphosis associated with other condition   . Obesity, unspecified   . Osteoarthrosis, unspecified whether generalized or localized, unspecified site   . Other and unspecified hyperlipidemia   . Other hammer toe (acquired)   . Postmenopausal atrophic vaginitis   . PVC's (premature ventricular contractions) 01/29/2015  . RBBB 01/29/2015  . Reflux esophagitis   . Type II or unspecified type diabetes mellitus without mention of complication, not stated as uncontrolled   . Unspecified constipation   . Unspecified disorder of kidney and ureter   . Unspecified essential hypertension   . Unspecified hereditary and idiopathic peripheral neuropathy   . Unspecified urinary incontinence     Past Surgical History:  Procedure Laterality Date  . BLADDER SURGERY     tie up in 2013  . EYE SURGERY  11/2016   Bilateral Cataract Sx  . TUBAL LIGATION      There were no  vitals filed for this visit.  Subjective Assessment - 07/25/18 1025    Subjective  Nothing new since last visit.    Patient Stated Goals  To look into a walker for traveling and to stay independent    Currently in Pain?  No/denies                       [facility] Adult PT Treatment/Exercise - 07/26/18 0800      Ambulation/Gait   Ambulation/Gait  Yes    Ambulation/Gait Assistance  5: Supervision    Ambulation/Gait Assistance Details  Cues provided for upright posture, staying close to rollator with gait    Ambulation Distance (Feet)  100 Feet   x 2, 60 ft x 3   Assistive device  Rollator    Gait Pattern  Step-through pattern;Decreased step length - right;Decreased step length - left;Decreased dorsiflexion - right;Decreased dorsiflexion - left;Narrow base of support    Gait Comments  Cues for increased foot clearance/heelstrike with gait      Exercises   Exercises  Knee/Hip;Ankle      Knee/Hip Exercises: Aerobic   Nustep  Level 2, lower extremities x 7:30, for lower extremity strengthening      Knee/Hip Exercises: Standing   Heel Raises  Both;1 set;10 reps    Knee Flexion  AROM;Strengthening;Right;Left;1 set;10 reps    Hip Flexion  Stengthening;Right;Left;1  set;10 reps;Knee bent   Marching   Hip Abduction  Stengthening;Right;Left;1 set;10 reps    Hip Extension  Stengthening;Right;Left;1 set;10 reps      Knee/Hip Exercises: Seated   Long Arc Quad  Strengthening;Right;Left;1 set;10 reps    Marching  Strengthening;Right;Left;1 set;10 reps      Ankle Exercises: Seated   Ankle Circles/Pumps  Strengthening;Right;Left;10 reps   Ankle pumps         Balance Exercises - 07/25/18 1041      Balance Exercises: Standing   Tandem Stance  Eyes open;Upper extremity support 1;3 reps;10 secs    SLS  Eyes open;Solid surface;Upper extremity support 2;3 reps;10 secs    Sidestepping  Upper extremity support;2 reps   R and L along counter       PT Education - 07/26/18 0805     Education Details  HEP- see instructions    Person(s) Educated  Patient    Methods  Explanation;Demonstration;Handout    Comprehension  Verbalized understanding;Returned demonstration;Verbal cues required          PT Long Term Goals - 07/02/18 0913      PT LONG TERM GOAL #1   Title  Pt will be independent with HEP for improved balance and gait.  TARGET 08/03/18    Time  5    Period  Weeks    Status  New    Target Date  08/03/18      PT LONG TERM GOAL #2   Title  Pt will improve gait velocity to at least 1.3 ft/sec for improved gait efficiency and safety.    Time  5    Period  Weeks    Status  New    Target Date  08/03/18      PT LONG TERM GOAL #3   Title  Pt will improve TUG score to less than or equal to 40 seconds for decreased fall risk.    Time  5    Period  Weeks    Status  New    Target Date  08/03/18      PT LONG TERM GOAL #4   Title  Berg Balance score to be completed, and goal written as appropriate.    Time  5    Period  Weeks    Status  New    Target Date  08/03/18      PT LONG TERM GOAL #5   Title  Pt will verbalize understanding of fall prevention in home environment.    Time  5    Period  Weeks    Status  New    Target Date  08/03/18      Additional Long Term Goals   Additional Long Term Goals  Yes      PT LONG TERM GOAL #6   Title  Pt will ambulate at least 500 ft, indoor and outdoor distances, modified independently with rollator, for improved community gait.    Time  5    Period  Weeks    Status  New    Target Date  08/03/18            Plan - 07/26/18 0806    Clinical Impression Statement  Worked again on seated and standing exercises and initiated HEP.  Pt requires several seated rest breaks during exercises, but overall tolerates exercises well.  Will continue to benefit from skilled PT to address balance, strength, and gait.    Rehab Potential  Good    PT Frequency  2x / week   1x/wk (eval week), then 2x/wk 4 weeks   PT  Duration  Other (comment)   plus eval week (5 weeks total POC)   PT Treatment/Interventions  ADLs/Self Care Home Management;DME Instruction;Balance training;Therapeutic exercise;Therapeutic activities;Functional mobility training;Gait training;Neuromuscular re-education;Patient/family education    PT Next Visit Plan  Review HEP-seated lower extremity strengthening, standing balance exercises    Consulted and Agree with Plan of Care  Patient       Patient will benefit from skilled therapeutic intervention in order to improve the following deficits and impairments:  Abnormal gait, Decreased balance, Decreased mobility, Decreased strength, Difficulty walking, Postural dysfunction  Visit Diagnosis: Muscle weakness (generalized)  Unsteadiness on feet     Problem List Patient Active Problem List   Diagnosis Date Noted  . Advance care planning 11/20/2017  . PVC's (premature ventricular contractions) 01/29/2015  . RBBB 01/29/2015  . Hyperlipidemia 01/06/2015  . Osteopenia 01/06/2015  . Benign essential HTN 07/29/2014  . Weight gain 07/29/2014  . Therapeutic opioid induced constipation 07/29/2014  . Spinal stenosis, lumbar region, with neurogenic claudication 04/30/2014  . Neuropathic pain 03/05/2014  . Bilateral leg pain 02/05/2014  . DM type 2 with diabetic peripheral neuropathy ([place]) 02/05/2014  . Unspecified constipation 01/08/2014  . Leg pain 01/08/2014  . Routine general medical examination at a health care facility 01/08/2014  . DM type 2, uncontrolled, with renal complications ([place]) 33/83/2919  . Hypertensive renal disease 10/08/2013  . Need for prophylactic vaccination and inoculation against influenza 10/08/2013  . Generalized OA 07/23/2013  . UTI (urinary tract infection) 06/19/2013  . Hypoglycemia 06/19/2013  . Diabetes mellitus with nephropathy ([place]) 03/21/2013  . Osteoarthritis   . Other and unspecified hyperlipidemia   . Chronic kidney disease     Jayd Cadieux  W. 07/26/2018, 8:24 AM  Frazier Butt., PT   [facility] [address] [place], [place], [address] Phone: [phone]   Fax:  [phone]  Name: Dawn [name] MRN: [id number] Date of Birth: 05/30/1928

## 2018-07-27 ENCOUNTER — Other Ambulatory Visit: Payer: Self-pay | Admitting: *Deleted

## 2018-07-27 DIAGNOSIS — M48062 Spinal stenosis, lumbar region with neurogenic claudication: Secondary | ICD-10-CM

## 2018-07-27 MED ORDER — OXYCODONE-ACETAMINOPHEN 5-325 MG PO TABS: 1.0000 | ORAL_TABLET | Freq: Three times a day (TID) | ORAL | 0 refills | Status: DC | PRN

## 2018-07-27 NOTE — Telephone Encounter (Signed)
Patient calling to check status of refill request. Patient states she called at 10 am this morning and expected that her medication would be filled by now.

## 2018-07-27 NOTE — Addendum Note (Signed)
Addended by: Logan Bores on: 07/27/2018 04:51 PM   Modules accepted: Orders

## 2018-07-27 NOTE — Telephone Encounter (Signed)
Patient requested refill [facility] Database Verified LR: 06/08/2018 Pended Rx and sent to Dr. Lyndel Safe for approval. (Dr. Cyndi Lennert patient. Dr. Mariea Clonts is off, Dr. Lyndel Safe covering.)

## 2018-07-27 NOTE — Addendum Note (Signed)
Addended by: Logan Bores on: 07/27/2018 04:38 PM   Modules accepted: Orders

## 2018-07-27 NOTE — Addendum Note (Signed)
Addended by: Logan Bores on: 07/27/2018 04:59 PM   Modules accepted: Orders

## 2018-07-30 ENCOUNTER — Ambulatory Visit: Payer: Medicare Other | Admitting: Physical Therapy

## 2018-07-30 ENCOUNTER — Other Ambulatory Visit: Payer: Medicare Other

## 2018-08-01 ENCOUNTER — Ambulatory Visit: Payer: Medicare Other

## 2018-08-01 DIAGNOSIS — R2681 Unsteadiness on feet: Secondary | ICD-10-CM

## 2018-08-01 DIAGNOSIS — R2689 Other abnormalities of gait and mobility: Secondary | ICD-10-CM

## 2018-08-01 DIAGNOSIS — M6281 Muscle weakness (generalized): Secondary | ICD-10-CM

## 2018-08-01 NOTE — Therapy (Signed)
[facility] [address] [place], [place], [address] Phone: [phone]   Fax:  [phone]  Physical Therapy Treatment  Patient Details  Name: Dawn [name] MRN: [id number] Date of Birth: 04/05/1928 Referring Provider: Hollace Kinnier   Encounter Date: 08/01/2018  PT End of Session - 08/01/18 1544    Visit Number  4    Number of Visits  10    Date for PT Re-Evaluation  08/31/18    Authorization Type  Medicare and Tric[employer] for Life-will require 10th visit progress note    PT Start Time  1412   pt late   PT Stop Time  1450    PT Time Calculation (min)  38 min    Equipment Utilized During Treatment  --   min guard to S prn   Activity Tolerance  Patient tolerated treatment well    Behavior During Therapy  [facility] for tasks assessed/performed       Past Medical History:  Diagnosis Date  . Anemia, unspecified   . Anorexia   . Chronic kidney disease   . Chronic kidney disease, stage III (moderate) (HCC)   . Cystocele, midline   . Disorder of bone and cartilage, unspecified   . Kyphosis associated with other condition   . Obesity, unspecified   . Osteoarthrosis, unspecified whether generalized or localized, unspecified site   . Other and unspecified hyperlipidemia   . Other hammer toe (acquired)   . Postmenopausal atrophic vaginitis   . PVC's (premature ventricular contractions) 01/29/2015  . RBBB 01/29/2015  . Reflux esophagitis   . Type II or unspecified type diabetes mellitus without mention of complication, not stated as uncontrolled   . Unspecified constipation   . Unspecified disorder of kidney and ureter   . Unspecified essential hypertension   . Unspecified hereditary and idiopathic peripheral neuropathy   . Unspecified urinary incontinence     Past Surgical History:  Procedure Laterality Date  . BLADDER SURGERY     tie up in 2013  . EYE SURGERY  11/2016   Bilateral Cataract Sx  . TUBAL LIGATION       There were no vitals filed for this visit.  Subjective Assessment - 08/01/18 1415    Subjective  Pt denied falls or change since last visit.     Patient Stated Goals  To look into a walker for traveling and to stay independent    Currently in Pain?  Yes    Pain Score  2     Pain Location  Leg    Pain Orientation  Left    Pain Descriptors / Indicators  Aching    Pain Frequency  Intermittent    Aggravating Factors   Unsure    Pain Relieving Factors  rest                       [facility] Adult PT Treatment/Exercise - 08/01/18 1418      Ambulation/Gait   Ambulation/Gait  Yes    Ambulation/Gait Assistance  5: Supervision    Ambulation/Gait Assistance Details  Cues to improve upright posture, improved stride length, and to sequence properly with rollator when amb. back to a chair for safety.     Ambulation Distance (Feet)  300 Feet    Assistive device  Rollator    Gait Pattern  Step-through pattern;Decreased step length - right;Decreased step length - left;Decreased dorsiflexion - right;Decreased dorsiflexion - left;Narrow base of support    Ambulation Surface  Level;Indoor    Gait velocity  1.53f/sec. and 1.7451fsec. with rollator      Standardized Balance Assessment   Standardized Balance Assessment  Berg Balance Test;Timed Up and Go Test      Berg Balance Test   Sit to Stand  Able to stand without using hands and stabilize independently    Standing Unsupported  Able to stand 2 minutes with supervision    Sitting with Back Unsupported but Feet Supported on Floor or Stool  Able to sit safely and securely 2 minutes    Stand to Sit  Sits safely with minimal use of hands    Transfers  Able to transfer safely, minor use of hands    Standing Unsupported with Eyes Closed  Able to stand 10 seconds with supervision    Standing Ubsupported with Feet Together  Able to place feet together independently and stand for 1 minute with supervision    From Standing, Reach Forward with  Outstretched Arm  Can reach forward >12 cm safely (5")    From Standing Position, Pick up Object from Floor  Able to pick up shoe, needs supervision    From Standing Position, Turn to Look Behind Over each Shoulder  Looks behind one side only/other side shows less weight shift    Turn 360 Degrees  Needs close supervision or verbal cueing    Standing Unsupported, Alternately Place Feet on Step/Stool  Able to complete >2 steps/needs minimal assist    Standing Unsupported, One Foot in Front  Able to take small step independently and hold 30 seconds    Standing on One Leg  Tries to lift leg/unable to hold 3 seconds but remains standing independently   L SLS 2 sec.   Total Score  39      Timed Up and Go Test   TUG  Normal TUG    Normal TUG (seconds)  27.82   and 27.99 sec. with rollator            Self care: PT Education - 08/01/18 1543    Education Details  PT provided pt with falls prevention handout. PT also educated pt on outcome measures and goal progress.     Person(s) Educated  Patient    Methods  Explanation;Handout    Comprehension  Verbalized understanding          PT Long Term Goals - 08/01/18 1548      PT LONG TERM GOAL #1   Title  Pt will be independent with HEP for improved balance and gait.  TARGET 08/03/18    Time  5    Period  Weeks    Status  New      PT LONG TERM GOAL #2   Title  Pt will improve gait velocity to at least 2.62 ft/sec for improved gait efficiency and safety.    Baseline  1.51f4fec with rollator on 08/01/18, first goal was 1.3ft50fc.     Time  5    Period  Weeks    Status  Revised      PT LONG TERM GOAL #3   Title  Pt will improve TUG score to less than or equal to 20 seconds for decreased fall risk.    Baseline  27.82sec. with rollator on 08/01/18    Time  5    Period  Weeks    Status  Revised      PT LONG TERM GOAL #4   Title  Berg Balance score to be completed, and  goal written as appropriate.    Baseline  39/56 on 08/01/18    Time   5    Period  Weeks    Status  New      PT LONG TERM GOAL #5   Title  Pt will verbalize understanding of fall prevention in home environment.    Time  5    Period  Weeks    Status  Not Met      Additional Long Term Goals   Additional Long Term Goals  Yes      PT LONG TERM GOAL #6   Title  Pt will ambulate at least 500 ft, indoor and outdoor distances, modified independently with rollator, for improved community gait.    Time  5    Period  Weeks    Status  New      PT LONG TERM GOAL #7   Title  Pt will improve BERG score to >/=45/56 to decr. falls risk.     Status  New            Plan - 08/01/18 1545    Clinical Impression Statement  Pt demonstrated progress as she met LTGs 2 and 3. Pt did not meet LTG 5 and PT will add a BERG LTG to pt's POC. PT will finish assessing pt's remaining goals next session and update the goal target date as appropriate, as pt's continues to be at a high risk for falls.     Rehab Potential  Good    PT Frequency  2x / week   1x/wk (eval week), then 2x/wk 4 weeks   PT Duration  Other (comment)   plus eval week (5 weeks total POC)   PT Treatment/Interventions  ADLs/Self Care Home Management;DME Instruction;Balance training;Therapeutic exercise;Therapeutic activities;Functional mobility training;Gait training;Neuromuscular re-education;Patient/family education    PT Next Visit Plan  Review HEP goal and LTG 6. -seated lower extremity strengthening, standing balance exercises    Consulted and Agree with Plan of Care  Patient       Patient will benefit from skilled therapeutic intervention in order to improve the following deficits and impairments:  Abnormal gait, Decreased balance, Decreased mobility, Decreased strength, Difficulty walking, Postural dysfunction  Visit Diagnosis: Other abnormalities of gait and mobility  Unsteadiness on feet  Muscle weakness (generalized)     Problem List Patient Active Problem List   Diagnosis Date Noted   . Advance care planning 11/20/2017  . PVC's (premature ventricular contractions) 01/29/2015  . RBBB 01/29/2015  . Hyperlipidemia 01/06/2015  . Osteopenia 01/06/2015  . Benign essential HTN 07/29/2014  . Weight gain 07/29/2014  . Therapeutic opioid induced constipation 07/29/2014  . Spinal stenosis, lumbar region, with neurogenic claudication 04/30/2014  . Neuropathic pain 03/05/2014  . Bilateral leg pain 02/05/2014  . DM type 2 with diabetic peripheral neuropathy ([place]) 02/05/2014  . Unspecified constipation 01/08/2014  . Leg pain 01/08/2014  . Routine general medical examination at a health care facility 01/08/2014  . DM type 2, uncontrolled, with renal complications ([place]) 57/32/2025  . Hypertensive renal disease 10/08/2013  . Need for prophylactic vaccination and inoculation against influenza 10/08/2013  . Generalized OA 07/23/2013  . UTI (urinary tract infection) 06/19/2013  . Hypoglycemia 06/19/2013  . Diabetes mellitus with nephropathy ([place]) 03/21/2013  . Osteoarthritis   . Other and unspecified hyperlipidemia   . Chronic kidney disease     Roby Spalla L 08/01/2018, 3:51 PM  [facility] [address] [place], [place], [address]  Phone: [phone]   Fax:  [phone]  Name: Dawn [name] MRN: [id number] Date of Birth: 08-20-1928  Geoffry Paradise, PT,DPT 08/01/18 3:52 PM Phone: [phone] Fax: [phone]

## 2018-08-01 NOTE — Patient Instructions (Signed)
Fall Prevention in the Home Falls can cause injuries and can affect people from all age groups. There are many simple things that you can do to make your home safe and to help prevent falls. What can I do on the outside of my home?  Regularly repair the edges of walkways and driveways and fix any cracks.  Remove high doorway thresholds.  Trim any shrubbery on the main path into your home.  Use bright outdoor lighting.  Clear walkways of debris and clutter, including tools and rocks.  Regularly check that handrails are securely fastened and in good repair. Both sides of any steps should have handrails.  Install guardrails along the edges of any raised decks or porches.  Have leaves, snow, and ice cleared regularly.  Use sand or salt on walkways during winter months.  In the garage, clean up any spills right away, including grease or oil spills. What can I do in the bathroom?  Use night lights.  Install grab bars by the toilet and in the tub and shower. Do not use towel bars as grab bars.  Use non-skid mats or decals on the floor of the tub or shower.  If you need to sit down while you are in the shower, use a plastic, non-slip stool.  Keep the floor dry. Immediately clean up any water that spills on the floor.  Remove soap buildup in the tub or shower on a regular basis.  Attach bath mats securely with double-sided non-slip rug tape.  Remove throw rugs and other tripping hazards from the floor. What can I do in the bedroom?  Use night lights.  Make sure that a bedside light is easy to reach.  Do not use oversized bedding that drapes onto the floor.  Have a firm chair that has side arms to use for getting dressed.  Remove throw rugs and other tripping hazards from the floor. What can I do in the kitchen?  Clean up any spills right away.  Avoid walking on wet floors.  Place frequently used items in easy-to-reach places.  If you need to reach for something above  you, use a sturdy step stool that has a grab bar.  Keep electrical cables out of the way.  Do not use floor polish or wax that makes floors slippery. If you have to use wax, make sure that it is non-skid floor wax.  Remove throw rugs and other tripping hazards from the floor. What can I do in the stairways?  Do not leave any items on the stairs.  Make sure that there are handrails on both sides of the stairs. Fix handrails that are broken or loose. Make sure that handrails are as long as the stairways.  Check any carpeting to make sure that it is firmly attached to the stairs. Fix any carpet that is loose or worn.  Avoid having throw rugs at the top or bottom of stairways, or secure the rugs with carpet tape to prevent them from moving.  Make sure that you have a light switch at the top of the stairs and the bottom of the stairs. If you do not have them, have them installed. What are some other fall prevention tips?  Wear closed-toe shoes that fit well and support your feet. Wear shoes that have rubber soles or low heels.  When you use a stepladder, make sure that it is completely opened and that the sides are firmly locked. Have someone hold the ladder while you are using   it. Do not climb a closed stepladder.  Add color or contrast paint or tape to grab bars and handrails in your home. Place contrasting color strips on the first and last steps.  Use mobility aids as needed, such as canes, walkers, scooters, and crutches.  Turn on lights if it is dark. Replace any light bulbs that burn out.  Set up furniture so that there are clear paths. Keep the furniture in the same spot.  Fix any uneven floor surfaces.  Choose a carpet design that does not hide the edge of steps of a stairway.  Be aware of any and all pets.  Review your medicines with your healthcare provider. Some medicines can cause dizziness or changes in blood pressure, which increase your risk of falling. Talk with  your health care provider about other ways that you can decrease your risk of falls. This may include working with a physical therapist or trainer to improve your strength, balance, and endurance. This information is not intended to replace advice given to you by your health care provider. Make sure you discuss any questions you have with your health care provider. Document Released: 11/18/2002 Document Revised: 04/26/2016 Document Reviewed: 01/02/2015 Elsevier Interactive Patient Education  2018 Elsevier Inc.  

## 2018-08-06 ENCOUNTER — Encounter: Payer: Self-pay | Admitting: Physical Therapy

## 2018-08-06 ENCOUNTER — Ambulatory Visit: Payer: Medicare Other | Admitting: Physical Therapy

## 2018-08-06 DIAGNOSIS — R2681 Unsteadiness on feet: Secondary | ICD-10-CM | POA: Diagnosis not present

## 2018-08-06 DIAGNOSIS — R2689 Other abnormalities of gait and mobility: Secondary | ICD-10-CM

## 2018-08-06 NOTE — Patient Instructions (Addendum)
SINGLE LIMB STANCE    Stance: single leg on floor, holding onto the counter. Raise leg. Hold 10___ seconds.  Stand tall and make sure to lighten up your support as you are able.  Repeat with other leg. _3__ reps per set, _1__ set per day, _5__ days per week  Copyright  VHI. All rights reserved.  Tandem Stance    Right foot in front of left, heel touching toe both feet "straight ahead". Stand tall and hold with support as lightly as possible.  Balance in this position _10__ seconds. Do with left foot in front of right.  Repeat 3 reps each foot position.  Copyright  VHI. All rights reserved.

## 2018-08-06 NOTE — Therapy (Signed)
[facility] [address] [place], [place], [address] Phone: [phone]   Fax:  [phone]  Physical Therapy Treatment  Patient Details  Name: Dawn [name] MRN: [id number] Date of Birth: 08-11-1928 Referring Provider: Hollace Kinnier   Encounter Date: 08/06/2018  PT End of Session - 08/06/18 2207    Visit Number  5    Number of Visits  10    Date for PT Re-Evaluation  08/31/18    Authorization Type  Medicare and Tricare for Life-will require 10th visit progress note    PT Start Time  1318    PT Stop Time  1403    PT Time Calculation (min)  45 min    Equipment Utilized During Treatment  --   min guard to S prn   Activity Tolerance  Patient tolerated treatment well    Behavior During Therapy  [facility] for tasks assessed/performed       Past Medical History:  Diagnosis Date  . Anemia, unspecified   . Anorexia   . Chronic kidney disease   . Chronic kidney disease, stage III (moderate) (HCC)   . Cystocele, midline   . Disorder of bone and cartilage, unspecified   . Kyphosis associated with other condition   . Obesity, unspecified   . Osteoarthrosis, unspecified whether generalized or localized, unspecified site   . Other and unspecified hyperlipidemia   . Other hammer toe (acquired)   . Postmenopausal atrophic vaginitis   . PVC's (premature ventricular contractions) 01/29/2015  . RBBB 01/29/2015  . Reflux esophagitis   . Type II or unspecified type diabetes mellitus without mention of complication, not stated as uncontrolled   . Unspecified constipation   . Unspecified disorder of kidney and ureter   . Unspecified essential hypertension   . Unspecified hereditary and idiopathic peripheral neuropathy   . Unspecified urinary incontinence     Past Surgical History:  Procedure Laterality Date  . BLADDER SURGERY     tie up in 2013  . EYE SURGERY  11/2016   Bilateral Cataract Sx  . TUBAL LIGATION      There were no  vitals filed for this visit.  Subjective Assessment - 08/06/18 1322    Subjective  I think I'm feeling better-hard to say for sure what has improved, but I feel that I have improved.    Patient Stated Goals  To look into a walker for traveling and to stay independent    Currently in Pain?  No/denies                       [facility] Adult PT Treatment/Exercise - 08/06/18 0001      Ambulation/Gait   Ambulation/Gait  Yes    Ambulation/Gait Assistance  5: Supervision    Ambulation/Gait Assistance Details  Cues to stay close within rollator BOS and for improved upright posture.    Ambulation Distance (Feet)  500 Feet   100 ft x 3   Assistive device  Rollator    Gait Pattern  Step-through pattern;Decreased step length - right;Decreased step length - left;Decreased dorsiflexion - right;Decreased dorsiflexion - left;Narrow base of support    Ambulation Surface  Level;Indoor      Knee/Hip Exercises: Standing   Knee Flexion  AROM;Strengthening;Right;Left;1 set;10 reps    Hip Flexion  Stengthening;Right;Left;1 set;10 reps;Knee bent    Hip Abduction  Stengthening;Right;Left;1 set;10 reps   2nd set alternating leg kicks   Hip Extension  Stengthening;Right;Left;1 set;10 reps  2nd set alternating leg kicks    Sit to stand 5 reps with UE support     Balance Exercises - 08/06/18 1351      Balance Exercises: Standing   Tandem Stance  Eyes open;Upper extremity support 1;3 reps;10 secs    SLS  Eyes open;Solid surface;Upper extremity support 2;3 reps;10 secs    SLS with Vectors  Solid surface;Upper extremity assist 2   Alternating step taps to 6" step, 2 sets x 10 reps   Sidestepping  Upper extremity support;2 reps    Heel Raises Limitations  10 reps    Toe Raise Limitations  10 reps        PT Education - 08/06/18 2207    Education Details  Updates to HEP-see instructions    Person(s) Educated  Patient    Methods  Explanation;Demonstration;Handout    Comprehension   Verbalized understanding;Returned demonstration          PT Long Term Goals - 08/06/18 2208      PT LONG TERM GOAL #1   Title  Pt will be independent with HEP for improved balance and gait.  TARGET (updated 08/10/18)-due to week remaining in POC    Time  5    Period  Weeks    Status  On-going      PT LONG TERM GOAL #2   Title  Pt will improve gait velocity to at least 2.62 ft/sec for improved gait efficiency and safety.    Baseline  1.42f/sec with rollator on 08/01/18, first goal was 1.330fsec.     Time  5    Period  Weeks    Status  Revised      PT LONG TERM GOAL #3   Title  Pt will improve TUG score to less than or equal to 20 seconds for decreased fall risk.    Baseline  27.82sec. with rollator on 08/01/18    Time  5    Period  Weeks    Status  Revised      PT LONG TERM GOAL #4   Title  Berg Balance score to be completed, and goal written as appropriate.    Baseline  39/56 on 08/01/18    Time  5    Period  Weeks    Status  Deferred   See BeMerrilee Janskyoal below     PT LONG TERM GOAL #5   Title  Pt will verbalize understanding of fall prevention in home environment.    Time  5    Period  Weeks    Status  Not Met      PT LONG TERM GOAL #6   Title  Pt will ambulate at least 500 ft, indoor and outdoor distances, modified independently with rollator, for improved community gait.    Time  5    Period  Weeks    Status  Partially Met      PT LONG TERM GOAL #7   Title  Pt will improve BERG score to >/=45/56 to decr. falls risk.     Status  On-going            Plan - 08/06/18 2210    Clinical Impression Statement  Pt has partially met LTG for outdoor long distance gait, as patient is able to ambulate 500 ft, but requires supervision with rollator and occasional cues for upright posture.  Adjusted date on LTGs to reflect additional week that remains in POC; however, pt feels she may be ready for d/c next  visit.  Will plan to furhter assess goals and discuss d/c plans next  visit.    Rehab Potential  Good    PT Frequency  2x / week   1x/wk (eval week), then 2x/wk 4 weeks   PT Duration  Other (comment)   plus eval week (5 weeks total POC)   PT Treatment/Interventions  ADLs/Self Care Home Management;DME Instruction;Balance training;Therapeutic exercise;Therapeutic activities;Functional mobility training;Gait training;Neuromuscular re-education;Patient/family education    PT Next Visit Plan  Review HEP, review remaining goals (and discuss d/c plan as pt feels she may want to d/c next visit)    Consulted and Agree with Plan of Care  Patient       Patient will benefit from skilled therapeutic intervention in order to improve the following deficits and impairments:  Abnormal gait, Decreased balance, Decreased mobility, Decreased strength, Difficulty walking, Postural dysfunction  Visit Diagnosis: Other abnormalities of gait and mobility  Unsteadiness on feet     Problem List Patient Active Problem List   Diagnosis Date Noted  . Advance care planning 11/20/2017  . PVC's (premature ventricular contractions) 01/29/2015  . RBBB 01/29/2015  . Hyperlipidemia 01/06/2015  . Osteopenia 01/06/2015  . Benign essential HTN 07/29/2014  . Weight gain 07/29/2014  . Therapeutic opioid induced constipation 07/29/2014  . Spinal stenosis, lumbar region, with neurogenic claudication 04/30/2014  . Neuropathic pain 03/05/2014  . Bilateral leg pain 02/05/2014  . DM type 2 with diabetic peripheral neuropathy ([place]) 02/05/2014  . Unspecified constipation 01/08/2014  . Leg pain 01/08/2014  . Routine general medical examination at a health care facility 01/08/2014  . DM type 2, uncontrolled, with renal complications ([place]) 92/10/9416  . Hypertensive renal disease 10/08/2013  . Need for prophylactic vaccination and inoculation against influenza 10/08/2013  . Generalized OA 07/23/2013  . UTI (urinary tract infection) 06/19/2013  . Hypoglycemia 06/19/2013  . Diabetes mellitus  with nephropathy ([place]) 03/21/2013  . Osteoarthritis   . Other and unspecified hyperlipidemia   . Chronic kidney disease     Enna Warwick W. 08/06/2018, 10:14 PM  Frazier Butt., PT   [facility] [address] [place], [place], [address] Phone: [phone]   Fax:  [phone]  Name: Dawn [name] MRN: [id number] Date of Birth: 13-Feb-1928

## 2018-08-07 ENCOUNTER — Ambulatory Visit (INDEPENDENT_AMBULATORY_CARE_PROVIDER_SITE_OTHER): Payer: Medicare Other | Admitting: Sports Medicine

## 2018-08-07 ENCOUNTER — Encounter: Payer: Self-pay | Admitting: Sports Medicine

## 2018-08-07 DIAGNOSIS — M2142 Flat foot [pes planus] (acquired), left foot: Secondary | ICD-10-CM

## 2018-08-07 DIAGNOSIS — M2042 Other hammer toe(s) (acquired), left foot: Secondary | ICD-10-CM

## 2018-08-07 DIAGNOSIS — M79609 Pain in unspecified limb: Secondary | ICD-10-CM | POA: Diagnosis not present

## 2018-08-07 DIAGNOSIS — B351 Tinea unguium: Secondary | ICD-10-CM

## 2018-08-07 DIAGNOSIS — M2141 Flat foot [pes planus] (acquired), right foot: Secondary | ICD-10-CM

## 2018-08-07 DIAGNOSIS — M2041 Other hammer toe(s) (acquired), right foot: Secondary | ICD-10-CM

## 2018-08-07 DIAGNOSIS — E1121 Type 2 diabetes mellitus with diabetic nephropathy: Secondary | ICD-10-CM

## 2018-08-07 DIAGNOSIS — M21619 Bunion of unspecified foot: Secondary | ICD-10-CM

## 2018-08-07 NOTE — Progress Notes (Signed)
Subjective: Dawn [name] is a 84 y.o. female patient with history of diabetes who presents to office today complaining of long,mildly painful nails  while ambulating in shoes; unable to trim. Patient states that the glucose reading this morning was 127 mg/dl. Patient denies any new changes in medication or new problems.   Patient Active Problem List   Diagnosis Date Noted  . Advance care planning 11/20/2017  . PVC's (premature ventricular contractions) 01/29/2015  . RBBB 01/29/2015  . Hyperlipidemia 01/06/2015  . Osteopenia 01/06/2015  . Benign essential HTN 07/29/2014  . Weight gain 07/29/2014  . Therapeutic opioid induced constipation 07/29/2014  . Spinal stenosis, lumbar region, with neurogenic claudication 04/30/2014  . Neuropathic pain 03/05/2014  . Bilateral leg pain 02/05/2014  . DM type 2 with diabetic peripheral neuropathy ([place]) 02/05/2014  . Unspecified constipation 01/08/2014  . Leg pain 01/08/2014  . Routine general medical examination at a health care facility 01/08/2014  . DM type 2, uncontrolled, with renal complications ([place]) 28/41/3244  . Hypertensive renal disease 10/08/2013  . Need for prophylactic vaccination and inoculation against influenza 10/08/2013  . Generalized OA 07/23/2013  . UTI (urinary tract infection) 06/19/2013  . Hypoglycemia 06/19/2013  . Diabetes mellitus with nephropathy ([place]) 03/21/2013  . Osteoarthritis   . Other and unspecified hyperlipidemia   . Chronic kidney disease    Current Outpatient Medications on File Prior to Visit  Medication Sig Dispense Refill  . aspirin 81 MG tablet Take 81 mg by mouth daily.    . calcium-vitamin D (OSCAL WITH D) 500-200 MG-UNIT per tablet Take 1 tablet by mouth daily.    . cholecalciferol (VITAMIN D) 1000 UNITS tablet Take 1,000 Units by mouth daily.    Marland Kitchen desoximetasone (TOPICORT) 0.05 % cream Apply topically 2 (two) times daily. 60 g 1  . gabapentin (NEURONTIN) 300 MG capsule Take one tablet by mouth in the  morning and one tablet by mouth in the evening for pains 180 capsule 3  . glipiZIDE (GLUCOTROL) 5 MG tablet TAKE 1 TABLET DAILY 90 tablet 1  . glucose blood (FREESTYLE LITE) test strip Use to test blood sugar. Dx: E11.21 300 each 3  . Lancets (FREESTYLE) lancets Use to test blood sugar. Dx: E11.21 300 each 3  . lipase/protease/amylase (CREON) 36000 UNITS CPEP capsule Take 1 capsule (36,000 Units total) by mouth 3 (three) times daily with meals. And snacks 270 capsule 3  . lisinopril-hydrochlorothiazide (PRINZIDE,ZESTORETIC) 20-12.5 MG tablet TAKE 1 TABLET DAILY TO CONTROL BLOOD PRESSURE 90 tablet 3  . oxyCODONE-acetaminophen (ROXICET) 5-325 MG tablet Take 1 tablet by mouth every 8 (eight) hours as needed for severe pain. 90 tablet 0  . pravastatin (PRAVACHOL) 20 MG tablet TAKE 1 TABLET WITH EVENING MEAL 90 tablet 1  . TRADJENTA 5 MG TABS tablet TAKE 1 TABLET DAILY 90 tablet 1  . vitamin E 400 UNIT capsule Take 400 Units by mouth daily.     No current facility-administered medications on file prior to visit.    Allergies  Allergen Reactions  . Keflex [Cephalexin]   . Metronidazole   . Simvastatin     Leg cramsps    Recent Results (from the past 2160 hour(s))  Hemoglobin A1c     Status: Abnormal   Collection Time: 06/08/18  9:09 AM  Result Value Ref Range   Hgb A1c MFr Bld 6.3 (H) <5.7 % of total Hgb    Comment: For someone without known diabetes, a hemoglobin  A1c value between 5.7% and 6.4% is consistent  with prediabetes and should be confirmed with a  follow-up test. . For someone with known diabetes, a value <7% indicates that their diabetes is well controlled. A1c targets should be individualized based on duration of diabetes, age, comorbid conditions, and other considerations. . This assay result is consistent with an increased risk of diabetes. . Currently, no consensus exists regarding use of hemoglobin A1c for diagnosis of diabetes for children. .    Mean Plasma  Glucose 134 (calc)   eAG (mmol/L) 7.4 (calc)  COMPLETE METABOLIC PANEL WITH GFR     Status: Abnormal   Collection Time: 06/08/18  9:09 AM  Result Value Ref Range   Glucose, Bld 123 (H) 65 - 99 mg/dL    Comment: .            Fasting reference interval . For someone without known diabetes, a glucose value between 100 and 125 mg/dL is consistent with prediabetes and should be confirmed with a follow-up test. .    BUN 29 (H) 7 - 25 mg/dL   Creat 1.83 (H) 0.60 - 0.88 mg/dL    Comment: For patients >81 years of age, the reference limit for Creatinine is approximately 13% higher for people identified as African-American. .    GFR, Est Non African American 24 (L) > OR = 60 mL/min/1.84m2   GFR, Est African American 28 (L) > OR = 60 mL/min/1.37m2   BUN/Creatinine Ratio 16 6 - 22 (calc)   Sodium 142 135 - 146 mmol/L   Potassium 4.2 3.5 - 5.3 mmol/L   Chloride 106 98 - 110 mmol/L   CO2 27 20 - 32 mmol/L   Calcium 9.4 8.6 - 10.4 mg/dL   Total Protein 6.6 6.1 - 8.1 g/dL   Albumin 3.7 3.6 - 5.1 g/dL   Globulin 2.9 1.9 - 3.7 g/dL (calc)   AG Ratio 1.3 1.0 - 2.5 (calc)   Total Bilirubin 0.4 0.2 - 1.2 mg/dL   Alkaline phosphatase (APISO) 78 33 - 130 U/L   AST 16 10 - 35 U/L   ALT 11 6 - 29 U/L  CBC with Differential/Platelet     Status: Abnormal   Collection Time: 06/08/18  9:09 AM  Result Value Ref Range   WBC 7.3 3.8 - 10.8 Thousand/uL   RBC 3.89 3.80 - 5.10 Million/uL   Hemoglobin 10.8 (L) 11.7 - 15.5 g/dL   HCT 33.8 (L) 35.0 - 45.0 %   MCV 86.9 80.0 - 100.0 fL   MCH 27.8 27.0 - 33.0 pg   MCHC 32.0 32.0 - 36.0 g/dL   RDW 14.1 11.0 - 15.0 %   Platelets 141 140 - 400 Thousand/uL   MPV 11.3 7.5 - 12.5 fL   Neutro Abs 4,424 1,500 - 7,800 cells/uL   Lymphs Abs 1,803 850 - 3,900 cells/uL   WBC mixed population 621 200 - 950 cells/uL   Eosinophils Absolute 380 15 - 500 cells/uL   Basophils Absolute 73 0 - 200 cells/uL   Neutrophils Relative % 60.6 %   Total Lymphocyte 24.7 %    Monocytes Relative 8.5 %   Eosinophils Relative 5.2 %   Basophils Relative 1.0 %    Objective: General: Patient is awake, alert, and oriented x 3 and in no acute distress.  Integument: Skin is warm, dry and supple bilateral. Nails are tender, long, thickened and  dystrophic with subungual debris, consistent with onychomycosis, 1-5 bilateral. + corn at right 3rd toe. No signs of infection. No open lesions or preulcerative  lesions present bilateral. Remaining integument unremarkable.  Vasculature:  Dorsalis Pedis pulse 1/4 bilateral. Posterior Tibial pulse  1/4 bilateral.  Capillary fill time <3 sec 1-5 bilateral. Positive hair growth to the level of the digits. Temperature gradient within normal limits. No varicosities present bilateral. No edema present bilateral.   Neurology: The patient has diminished sensation measured with a 5.07/10g Semmes Weinstein Monofilament at all pedal sites bilateral . Vibratory sensation diminished bilateral with tuning fork. No Babinski sign present bilateral.   Musculoskeletal: Asymptomatic bunion, hammertoe, pes planus pedal deformities noted bilateral. Muscular strength 4 /5 in all lower extremity muscular groups bilateral without pain on range of motion . No tenderness with calf compression bilateral.  Assessment and Plan: Problem List Items Addressed This Visit      Endocrine   Diabetes mellitus with nephropathy ([place])    Other Visit Diagnoses    Pain due to onychomycosis of nail    -  Primary   Acquired hammer toes of both feet       Bunion       Pes planus of both feet         -Examined patient. -Discussed and educated patient on diabetic foot care, especially with  regards to the vascular, neurological and musculoskeletal systems.  -Stressed the importance of good glycemic control and the detriment of not  controlling glucose levels in relation to the foot. -Mechanically debrided all nails 1-5 bilateral using sterile nail nipper and filed  with dremel without incident  -Safe step diabetic shoe order form was completed; office to contact primary care for approval / certification;  Office to arrange shoe fitting and dispensing. -Patient to return  in 3 months for at risk foot care -Patient advised to call the office if any problems or questions arise in the meantime.  Landis Martins, DPM

## 2018-08-09 ENCOUNTER — Ambulatory Visit: Payer: Medicare Other | Admitting: Physical Therapy

## 2018-08-09 ENCOUNTER — Encounter: Payer: Self-pay | Admitting: Physical Therapy

## 2018-08-09 DIAGNOSIS — R2681 Unsteadiness on feet: Secondary | ICD-10-CM

## 2018-08-09 DIAGNOSIS — R2689 Other abnormalities of gait and mobility: Secondary | ICD-10-CM

## 2018-08-10 NOTE — Therapy (Addendum)
[facility] [address] [place], [place], [address] Phone: [phone]   Fax:  [phone]  Physical Therapy Treatment  Patient Details  Name: Dawn [name] MRN: [id number] Date of Birth: 1928-08-13 Referring Provider: Hollace Kinnier   Encounter Date: 08/09/2018  PT End of Session - 08/10/18 1608    Visit Number  6    Number of Visits  10    Date for PT Re-Evaluation  08/31/18    Authorization Type  Medicare and [employer] for [employer]-will require 10th visit progress note    PT Start Time  1318    PT Stop Time  1359    PT Time Calculation (min)  41 min    Activity Tolerance  Patient tolerated treatment well    Behavior During Therapy  [facility] for tasks assessed/performed       Past Medical History:  Diagnosis Date  . Anemia, unspecified   . Anorexia   . Chronic kidney disease   . Chronic kidney disease, stage III (moderate) (HCC)   . Cystocele, midline   . Disorder of bone and cartilage, unspecified   . Kyphosis associated with other condition   . Obesity, unspecified   . Osteoarthrosis, unspecified whether generalized or localized, unspecified site   . Other and unspecified hyperlipidemia   . Other hammer toe (acquired)   . Postmenopausal atrophic vaginitis   . PVC's (premature ventricular contractions) 01/29/2015  . RBBB 01/29/2015  . Reflux esophagitis   . Type II or unspecified type diabetes mellitus without mention of complication, not stated as uncontrolled   . Unspecified constipation   . Unspecified disorder of kidney and ureter   . Unspecified essential hypertension   . Unspecified hereditary and idiopathic peripheral neuropathy   . Unspecified urinary incontinence     Past Surgical History:  Procedure Laterality Date  . BLADDER SURGERY     tie up in 2013  . EYE SURGERY  11/2016   Bilateral Cataract Sx  . TUBAL LIGATION      There were no vitals filed for this visit.  Subjective Assessment - 08/09/18  1319    Subjective  Glad to be here today.    Patient Stated Goals  To look into a walker for traveling and to stay independent    Currently in Pain?  No/denies                       [facility] Adult PT Treatment/Exercise - 08/10/18 1604      Transfers   Transfers  Sit to Stand;Stand to Sit    Sit to Stand  6: Modified independent (Device/Increase time);With upper extremity assist;From bed    Stand to Sit  6: Modified independent (Device/Increase time);With upper extremity assist;To bed    Number of Reps  10 reps    Comments  Added to HEP for functional strengthening      Ambulation/Gait   Ambulation/Gait  Yes    Ambulation/Gait Assistance  6: Modified independent (Device/Increase time)    Ambulation Distance (Feet)  100 Feet   x 2, 115 ft    Assistive device  Rollator    Gait Pattern  Step-through pattern;Decreased step length - right;Decreased step length - left;Decreased dorsiflexion - right;Decreased dorsiflexion - left;Narrow base of support    Ambulation Surface  Level;Indoor    Gait velocity  22 sec = 1.49 ft/sec with rollator      Timed Up and Go Test   TUG  Normal TUG  Normal TUG (seconds)  35.34   29.44 sec with rollator     Self-Care   Self-Care  Other Self-Care Comments    Other Self-Care Comments   Reviewed fall prevention education; discussed [employer] program on [address], as option for continued community fitness.  Reviewed POC and pt's goals, she is in agreement with d/c from PT this visit.      Ankle Exercises: Seated   Ankle Circles/Pumps  Strengthening;Right;Left;10 reps   2 sets     Ankle Exercises: Standing   Heel Raises Limitations  2 sets of 10 reps    Toe Raise Limitations  2 sets x 10 reps          Balance Exercises - 08/10/18 1557      Balance Exercises: Standing   Tandem Stance  Eyes open;Upper extremity support 1;3 reps;10 secs    SLS  Eyes open;Solid surface;Upper extremity support 2;3 reps;10 secs    Other Standing Exercises   Reviewed tandem stance and single limb stance as recent additions to HEP.  Performed SLS modified with foot propped on cabinet shelf for improved stability.  Made written revisions in HEP to make sure patient knows to hold with UE support for SLS and tandem stance activities        PT Education - 08/10/18 1607    Education Details  Added sit to stand to HEP and provided written revisions to pt's SLS and tandem stance exercises.  Provided patient with info on [employer] on [address].  Reviewed fall prevention and discussed POC/discharge this visit.    Person(s) Educated  Patient    Methods  Explanation;Demonstration;Handout    Comprehension  Verbalized understanding          PT Long Term Goals - 08/09/18 1320      PT LONG TERM GOAL #1   Title  Pt will be independent with HEP for improved balance and gait.  TARGET (updated 08/10/18)-due to week remaining in POC    Time  5    Period  Weeks    Status  Achieved      PT LONG TERM GOAL #2   Title  Pt will improve gait velocity to at least 2.62 ft/sec for improved gait efficiency and safety.    Baseline  1.49 ft/sec with rollator on 08/09/18    Time  5    Period  Weeks    Status  Not Met      PT LONG TERM GOAL #3   Title  Pt will improve TUG score to less than or equal to 20 seconds for decreased fall risk.    Baseline  27.82sec. with rollator on 08/01/18    Time  5    Period  Weeks    Status  Not Met      PT LONG TERM GOAL #4   Title  Berg Balance score to be completed, and goal written as appropriate.    Baseline  39/56 on 08/01/18    Time  5    Period  Weeks    Status  Deferred   See Dawn [name] Goal below     PT LONG TERM GOAL #5   Title  Pt will verbalize understanding of fall prevention in home environment.    Baseline  Reviewed fall prevention provided at 2 previous visits.    Time  5    Period  Weeks    Status  Achieved      PT LONG TERM GOAL #6   Title  Pt will ambulate at least 500 ft, indoor and outdoor distances, modified  independently with rollator, for improved community gait.    Time  5    Period  Weeks    Status  Partially Met      PT LONG TERM GOAL #7   Title  Pt will improve BERG score to >/=45/56 to decr. falls risk.     Status  Not Met   Dawn [name] checked x2 in past 2 weeks-improved to 39/56 at last check           Plan - 08/10/18 1608    Clinical Impression Statement  Assessed LTGs this visit, wtih pt meeting LTG 1 and 5.  LTG 2 and 3 not met for TUG and gait velocity scores.  Pt's gait velocity flucuates from visit to visit and TUG scores remain slow.  LTG 4 deferred, as LTG 7 was written for Dawn [name]; however, Dawn [name] not assessed at d/c as that would be third Berg in 2 weeks.  Pt improved 26/56 to 39/56.  She remains at fall risk, but is using rollator at all times and appears safe with rollator walker.  She is appropriate for d/c this visit, with appropriate HEP and information given on [employer] program for continued fitness.    Rehab Potential  Good    PT Frequency  2x / week   1x/wk (eval week), then 2x/wk 4 weeks   PT Duration  Other (comment)   plus eval week (5 weeks total POC)   PT Treatment/Interventions  ADLs/Self Care Home Management;DME Instruction;Balance training;Therapeutic exercise;Therapeutic activities;Functional mobility training;Gait training;Neuromuscular re-education;Patient/family education    PT Next Visit Plan  Discharge this visit    Consulted and Agree with Plan of Care  Patient       Patient will benefit from skilled therapeutic intervention in order to improve the following deficits and impairments:  Abnormal gait, Decreased balance, Decreased mobility, Decreased strength, Difficulty walking, Postural dysfunction  Visit Diagnosis: Other abnormalities of gait and mobility  Unsteadiness on feet     Problem List Patient Active Problem List   Diagnosis Date Noted  . Advance care planning 11/20/2017  . PVC's (premature ventricular contractions) 01/29/2015  . RBBB  01/29/2015  . Hyperlipidemia 01/06/2015  . Osteopenia 01/06/2015  . Benign essential HTN 07/29/2014  . Weight gain 07/29/2014  . Therapeutic opioid induced constipation 07/29/2014  . Spinal stenosis, lumbar region, with neurogenic claudication 04/30/2014  . Neuropathic pain 03/05/2014  . Bilateral leg pain 02/05/2014  . DM type 2 with diabetic peripheral neuropathy ([place]) 02/05/2014  . Unspecified constipation 01/08/2014  . Leg pain 01/08/2014  . Routine general medical examination at a health care facility 01/08/2014  . DM type 2, uncontrolled, with renal complications ([place]) 44/73/9584  . Hypertensive renal disease 10/08/2013  . Need for prophylactic vaccination and inoculation against influenza 10/08/2013  . Generalized OA 07/23/2013  . UTI (urinary tract infection) 06/19/2013  . Hypoglycemia 06/19/2013  . Diabetes mellitus with nephropathy ([place]) 03/21/2013  . Osteoarthritis   . Other and unspecified hyperlipidemia   . Chronic kidney disease     Dawn [name] W. 08/10/2018, 4:16 PM  Frazier Butt., PT   [facility] [address] [place], [place], [address] Phone: [phone]   Fax:  [phone]  Name: Dawn [name]   PHYSICAL THERAPY DISCHARGE SUMMARY  Visits from Start of Care: 6  Current functional level related to goals / functional outcomes: PT Long Term Goals - 08/09/18 1320  PT LONG TERM GOAL #1   Title  Pt will be independent with HEP for improved balance and gait.  TARGET (updated 08/10/18)-due to week remaining in POC    Time  5    Period  Weeks    Status  Achieved      PT LONG TERM GOAL #2   Title  Pt will improve gait velocity to at least 2.62 ft/sec for improved gait efficiency and safety.    Baseline  1.49 ft/sec with rollator on 08/09/18    Time  5    Period  Weeks    Status  Not Met      PT LONG TERM GOAL #3   Title  Pt will improve TUG score to less than or equal to 20 seconds for  decreased fall risk.    Baseline  27.82sec. with rollator on 08/01/18    Time  5    Period  Weeks    Status  Not Met      PT LONG TERM GOAL #4   Title  Berg Balance score to be completed, and goal written as appropriate.    Baseline  39/56 on 08/01/18    Time  5    Period  Weeks    Status  Deferred   See Dawn [name] Goal below     PT LONG TERM GOAL #5   Title  Pt will verbalize understanding of fall prevention in home environment.    Baseline  Reviewed fall prevention provided at 2 previous visits.    Time  5    Period  Weeks    Status  Achieved      PT LONG TERM GOAL #6   Title  Pt will ambulate at least 500 ft, indoor and outdoor distances, modified independently with rollator, for improved community gait.    Time  5    Period  Weeks    Status  Partially Met      PT LONG TERM GOAL #7   Title  Pt will improve BERG score to >/=45/56 to decr. falls risk.     Status  Not Met   Dawn [name] checked x2 in past 2 weeks-improved to 39/56 at last check     Pt has met 2 of 7, partially met 1 LTG.      Remaining deficits: Pt has improved from initial Berg score, but remains at fall risk.  She is using rollator at all times for gait safety.   Education / Equipment: Educated in use of rollator, fall prevention, HEP, community fitness.  Plan: Patient agrees to discharge.  Patient goals were partially met. Patient is being discharged due to being pleased with the current functional level.  ?????      MRN: [id number] Date of Birth: March 16, 1928   Mady Haagensen, PT 08/10/18 4:20 PM Phone: [phone] Fax: [phone]

## 2018-08-15 ENCOUNTER — Ambulatory Visit: Payer: Medicare Other | Admitting: Orthotics

## 2018-08-15 DIAGNOSIS — M2042 Other hammer toe(s) (acquired), left foot: Secondary | ICD-10-CM

## 2018-08-15 DIAGNOSIS — M79609 Pain in unspecified limb: Principal | ICD-10-CM

## 2018-08-15 DIAGNOSIS — M2041 Other hammer toe(s) (acquired), right foot: Secondary | ICD-10-CM

## 2018-08-15 DIAGNOSIS — B351 Tinea unguium: Secondary | ICD-10-CM

## 2018-08-15 DIAGNOSIS — E1121 Type 2 diabetes mellitus with diabetic nephropathy: Secondary | ICD-10-CM

## 2018-08-15 DIAGNOSIS — M21619 Bunion of unspecified foot: Secondary | ICD-10-CM

## 2018-08-17 NOTE — Progress Notes (Signed)

## 2018-08-21 ENCOUNTER — Other Ambulatory Visit: Payer: Self-pay | Admitting: *Deleted

## 2018-08-21 MED ORDER — GLUCOSE BLOOD VI STRP: ORAL_STRIP | 3 refills | Status: DC

## 2018-08-21 MED ORDER — GABAPENTIN 300 MG PO CAPS: ORAL_CAPSULE | ORAL | 3 refills | Status: DC

## 2018-08-21 NOTE — Telephone Encounter (Signed)
Patient requested refill to be sent to [facility].  

## 2018-08-29 ENCOUNTER — Encounter: Payer: Self-pay | Admitting: Gastroenterology

## 2018-08-29 ENCOUNTER — Ambulatory Visit (INDEPENDENT_AMBULATORY_CARE_PROVIDER_SITE_OTHER): Payer: Medicare Other | Admitting: Gastroenterology

## 2018-08-29 VITALS — BP 110/54 | HR 84 | Ht 65.0 in | Wt 168.8 lb

## 2018-08-29 DIAGNOSIS — K529 Noninfective gastroenteritis and colitis, unspecified: Secondary | ICD-10-CM

## 2018-08-29 DIAGNOSIS — R634 Abnormal weight loss: Secondary | ICD-10-CM

## 2018-08-29 NOTE — Patient Instructions (Addendum)
If you are age 82 or older, your body mass index should be between 23-30. Your Body mass index is 28.09 kg/m. If this is out of the aforementioned range listed, please consider follow up with your Primary Care Provider.  If you are age 65 or younger, your body mass index should be between 19-25. Your Body mass index is 28.09 kg/m. If this is out of the aformentioned range listed, please consider follow up with your Primary Care Provider.   Imodium 1 tablet by mouth twice a day as needed for diarrhea  Resume Creon 2 capsules with meals.  Follow up with Dr Loletha Carrow on 10-03-2018.  It was a pleasure to see you today!  Dr. Loletha Carrow

## 2018-08-29 NOTE — Progress Notes (Signed)
[facility] Consult Note:  History: Dawn [name] 08/29/2018  Referring physician: Gayland Curry, DO  Reason for consult/chief complaint: Diarrhea (Once weekly for 2-3 days very loose stools)   Subjective  HPI:  This is a very pleasant 84 year old woman referred by primary care.  She describes over a year of diarrhea that occurs once or twice a week but might last a couple of days.  There will be the urgent need for a bowel movement that is loose nonbloody, sometimes causing incontinence.  There have been no clear or consistent triggers, and she does not think there were any new meds, sick contacts or other obvious causes.  She denies abdominal pain nausea, vomiting or early satiety. Reviewed the office note by Dr. Mariea Clonts of primary care on 07/02/2018 describing this chronic diarrhea and its lack of sufficient response to pancreatic enzyme supplements (patient had reportedly described fatty appearance to stools).  The patient was prescribed 1 capsule of 36,000 unit Creon with meals 3 times a day, and when taking 2 capsules a day, was still having 2-3 BMs per day.  Patient was reportedly losing weight and heme-negative.  Flowsheet review shows weight 179 pounds in February 2018, 176 pounds February 2019, 172 pounds on 07/02/18 and 168 pounds today.  When brought to her attention, she notes efforts to lose weight over the last several months.  ROS:  Review of Systems  Constitutional: Negative for appetite change and unexpected weight change.  HENT: Negative for mouth sores and voice change.   Eyes: Negative for pain and redness.  Respiratory: Negative for cough and shortness of breath.   Cardiovascular: Negative for chest pain and palpitations.  Genitourinary: Negative for dysuria and hematuria.  Musculoskeletal: Positive for arthralgias. Negative for myalgias.  Skin: Negative for pallor and rash.  Neurological: Negative for weakness and headaches.  Hematological:  Negative for adenopathy.     Past Medical History: Past Medical History:  Diagnosis Date  . Anemia, unspecified   . Anorexia   . Chronic kidney disease   . Chronic kidney disease, stage III (moderate) (HCC)   . Cystocele, midline   . Disorder of bone and cartilage, unspecified   . Kyphosis associated with other condition   . Obesity, unspecified   . Osteoarthrosis, unspecified whether generalized or localized, unspecified site   . Other and unspecified hyperlipidemia   . Other hammer toe (acquired)   . Postmenopausal atrophic vaginitis   . PVC's (premature ventricular contractions) 01/29/2015  . RBBB 01/29/2015  . Reflux esophagitis   . Type II or unspecified type diabetes mellitus without mention of complication, not stated as uncontrolled   . Unspecified constipation   . Unspecified disorder of kidney and ureter   . Unspecified essential hypertension   . Unspecified hereditary and idiopathic peripheral neuropathy   . Unspecified urinary incontinence      Past Surgical History: Past Surgical History:  Procedure Laterality Date  . BLADDER SURGERY     tie up in 2013  . EYE SURGERY  11/2016   Bilateral Cataract Sx  . TUBAL LIGATION       Family History: Family History  Problem Relation Age of Onset  . Heart disease Mother   . Heart disease Sister   . Cancer Son   . Cancer Son     Social History: Social History   Socioeconomic History  . Marital status: Widowed    Spouse name: Not on file  . Number of children: 5  . Years of  education: 16  . Highest education level: Bachelor's degree (e.g., BA, AB, BS)  Occupational History  . Occupation: retired Occupational psychologist  Social Needs  . Financial resource strain: Not hard at all  . Food insecurity:    Worry: Never true    Inability: Never true  . Transportation needs:    Medical: No    Non-medical: No  Tobacco Use  . Smoking status: Former Smoker    Last attempt to quit: 12/12/1968    Years since  quitting: 49.7  . Smokeless tobacco: Never Used  . Tobacco comment: quit in 1970  Substance and Sexual Activity  . Alcohol use: No    Alcohol/week: 0.0 standard drinks  . Drug use: No  . Sexual activity: Never  Lifestyle  . Physical activity:    Days per week: 1 day    Minutes per session: 60 min  . Stress: Not at all  Relationships  . Social connections:    Talks on phone: More than three times a week    Gets together: More than three times a week    Attends religious service: 1 to 4 times per year    Active member of club or organization: Yes    Attends meetings of clubs or organizations: 1 to 4 times per year    Relationship status: Widowed  Other Topics Concern  . Not on file  Social History Narrative   Widow - husband died 38   Lives one story house -son and grandson lives with her   Former smoker -stopped 1970   Alcohol none   Exercise -some walking   POA, Living Will   Prior smoker as noted above, quit decades ago   allergies: Allergies  Allergen Reactions  . Keflex [Cephalexin]   . Metronidazole   . Simvastatin     Leg cramsps    Outpatient Meds: Current Outpatient Medications  Medication Sig Dispense Refill  . aspirin 81 MG tablet Take 81 mg by mouth daily.    . calcium-vitamin D (OSCAL WITH D) 500-200 MG-UNIT per tablet Take 1 tablet by mouth daily.    . cholecalciferol (VITAMIN D) 1000 UNITS tablet Take 1,000 Units by mouth daily.    Marland Kitchen desoximetasone (TOPICORT) 0.05 % cream Apply topically 2 (two) times daily. 60 g 1  . gabapentin (NEURONTIN) 300 MG capsule Take one tablet by mouth in the morning and one tablet by mouth in the evening for pains 180 capsule 3  . glipiZIDE (GLUCOTROL) 5 MG tablet TAKE 1 TABLET DAILY 90 tablet 1  . glucose blood (FREESTYLE LITE) test strip Use to test blood sugar once daily. Dx: E11.21 300 each 3  . Lancets (FREESTYLE) lancets Use to test blood sugar. Dx: E11.21 300 each 3  . lisinopril-hydrochlorothiazide  (PRINZIDE,ZESTORETIC) 20-12.5 MG tablet TAKE 1 TABLET DAILY TO CONTROL BLOOD PRESSURE 90 tablet 3  . oxyCODONE-acetaminophen (ROXICET) 5-325 MG tablet Take 1 tablet by mouth every 8 (eight) hours as needed for severe pain. 90 tablet 0  . pravastatin (PRAVACHOL) 20 MG tablet TAKE 1 TABLET WITH EVENING MEAL 90 tablet 1  . TRADJENTA 5 MG TABS tablet TAKE 1 TABLET DAILY 90 tablet 1  . vitamin E 400 UNIT capsule Take 400 Units by mouth daily.    . lipase/protease/amylase (CREON) 36000 UNITS CPEP capsule Take 1 capsule (36,000 Units total) by mouth 3 (three) times daily with meals. And snacks (Patient not taking: Reported on 08/29/2018) 270 capsule 3   No current facility-administered medications  for this visit.       ___________________________________________________________________ Objective   Exam:  BP (!) 110/54   Pulse 84   Ht 5\' 5"  (1.651 m)   Wt 168 lb 12.8 oz (76.6 kg)   BMI 28.09 kg/m    General: this is a(n) elderly woman in no acute distress, slow antalgic gait, gets on exam table with a little assistance.  Eyes: sclera anicteric, no redness  ENT: oral mucosa moist without lesions, no cervical or supraclavicular lymphadenopathy, good dentition  CV: RRR without murmur, S1/S2, no JVD, no peripheral edema  Resp: clear to auscultation bilaterally, normal RR and effort noted  GI: soft, no tenderness, with active bowel sounds. No guarding or palpable organomegaly noted.  Skin; warm and dry, no rash or jaundice noted  Neuro: awake, alert and oriented x 3. Normal gross motor function and fluent speech  Labs:  CBC Latest Ref Rng & Units 06/08/2018 11/14/2017 07/10/2017  WBC 3.8 - 10.8 Thousand/uL 7.3 7.6 7.9  Hemoglobin 11.7 - 15.5 g/dL 10.8(L) 11.1(L) 10.8(L)  Hematocrit 35.0 - 45.0 % 33.8(L) 34.1(L) 34.6(L)  Platelets 140 - 400 Thousand/uL 141 137(L) 186  MCV 87, RDW 14  CMP Latest Ref Rng & Units 06/08/2018 11/14/2017 07/10/2017  Glucose 65 - 99 mg/dL 123(H) 120(H) 85    BUN 7 - 25 mg/dL 29(H) 26(H) 24  Creatinine 0.60 - 0.88 mg/dL 1.83(H) 1.57(H) 1.53(H)  Sodium 135 - 146 mmol/L 142 140 142  Potassium 3.5 - 5.3 mmol/L 4.2 4.5 4.0  Chloride 98 - 110 mmol/L 106 106 106  CO2 20 - 32 mmol/L 27 28 23   Calcium 8.6 - 10.4 mg/dL 9.4 9.6 8.7  Total Protein 6.1 - 8.1 g/dL 6.6 - -  Total Bilirubin 0.2 - 1.2 mg/dL 0.4 - -  Alkaline Phos 33 - 130 U/L - - -  AST 10 - 35 U/L 16 - -  ALT 6 - 29 U/L 11 - -   Last albumin check February 2018 Last TSH several years ago  Assessment: Encounter Diagnoses  Name Primary?  . Chronic diarrhea Yes  . Abnormal loss of weight    This diarrhea is difficult to characterize, and it seems like the weight loss may have been purposeful.  She was a former smoker, and a long standing diabetic, so exocrine pancreatic insufficiency is worth considering.  It is difficult to tell from her description of stools whether they are really oily.  If EPI is the cause, it does not seem she had a sufficient dose during the trial of pancreatic enzymes. Does not seem to have typical risk factors for bacterial overgrowth or other malabsorptive syndrome.  Microscopic colitis could certainly do this, and we discussed the possibility of a colonoscopy. She does not appear to be clinically hyperthyroid.  None of her medicines seem likely culprits.  Plan:  A repeat trial of Creon capsules, since she should have a sufficient supply only taken 1 tablet 2 or 3 times a day for less than 2 weeks.  I like her to take 2 capsules with meals 3 times a day for the next 2 weeks.  If this is the diagnosis, this should be improvement by then.  She will take 1 Imodium tablet twice a day as needed when diarrhea occurs.  She will follow-up with me in a few weeks to reassess symptoms and weight.  If not much improved, plan colonoscopy.  Thank you for the courtesy of this consult.  Please call me with any questions  or concerns.  Nelida Meuse III  CC: Reed, Tiffany L,  DO

## 2018-09-13 ENCOUNTER — Other Ambulatory Visit: Payer: Self-pay | Admitting: *Deleted

## 2018-09-13 DIAGNOSIS — M48062 Spinal stenosis, lumbar region with neurogenic claudication: Secondary | ICD-10-CM

## 2018-09-13 MED ORDER — OXYCODONE-ACETAMINOPHEN 5-325 MG PO TABS: 1.0000 | ORAL_TABLET | Freq: Three times a day (TID) | ORAL | 0 refills | Status: DC | PRN

## 2018-09-13 NOTE — Telephone Encounter (Signed)
Patient requested refill [place] Verified LR: 07/27/2018 Pharmacy Confirmed Pended Rx and sent to Dr. Mariea Clonts for approval.

## 2018-09-18 ENCOUNTER — Ambulatory Visit: Payer: Medicare Other | Admitting: Orthotics

## 2018-09-18 DIAGNOSIS — B351 Tinea unguium: Secondary | ICD-10-CM

## 2018-09-18 DIAGNOSIS — E1121 Type 2 diabetes mellitus with diabetic nephropathy: Secondary | ICD-10-CM

## 2018-09-18 DIAGNOSIS — M2042 Other hammer toe(s) (acquired), left foot: Secondary | ICD-10-CM

## 2018-09-18 DIAGNOSIS — M2041 Other hammer toe(s) (acquired), right foot: Secondary | ICD-10-CM

## 2018-09-18 DIAGNOSIS — M79609 Pain in unspecified limb: Principal | ICD-10-CM

## 2018-09-18 NOTE — Progress Notes (Signed)

## 2018-09-22 ENCOUNTER — Other Ambulatory Visit: Payer: Self-pay | Admitting: Internal Medicine

## 2018-09-22 DIAGNOSIS — E1121 Type 2 diabetes mellitus with diabetic nephropathy: Secondary | ICD-10-CM

## 2018-10-03 ENCOUNTER — Encounter: Payer: Self-pay | Admitting: Gastroenterology

## 2018-10-03 ENCOUNTER — Ambulatory Visit (INDEPENDENT_AMBULATORY_CARE_PROVIDER_SITE_OTHER): Payer: Medicare Other | Admitting: Gastroenterology

## 2018-10-03 VITALS — BP 130/82 | HR 64 | Ht 65.0 in | Wt 164.0 lb

## 2018-10-03 DIAGNOSIS — K529 Noninfective gastroenteritis and colitis, unspecified: Secondary | ICD-10-CM | POA: Diagnosis not present

## 2018-10-03 DIAGNOSIS — R634 Abnormal weight loss: Secondary | ICD-10-CM | POA: Diagnosis not present

## 2018-10-03 MED ORDER — PEG-KCL-NACL-NASULF-NA ASC-C 140 G PO SOLR: 140.0000 g | ORAL | 0 refills | Status: DC

## 2018-10-03 NOTE — Progress Notes (Signed)
[facility] Progress Note  Chief Complaint: Chronic diarrhea and weight loss  Subjective  History:  Dawn [name] follows up about 5 weeks after her consult with me.  She continues to have loose stools several times per day that persist despite use of Imodium.  There is not been nocturnal diarrhea or episodes of incontinence.  She stopped the Creon because she felt it was making things worse.  She typically has 2-4 BMs that she describes as "small with some water".  She denies abdominal pain, she does have some lower abdominal bloating at times.  She denies loss of appetite, and is consuming at least 3 small meals a day plus snacks.  Despite that, she has lost 4 more pounds since her last visit.  ROS: Cardiovascular:  no chest pain Respiratory: no dyspnea Positive for arthralgias Neuropathy pain Remainder of systems negative except as above The patient's Past Medical, Family and Social History were reviewed and are on file in the EMR. (Decades of diabetes)  Past Surgical History:  Procedure Laterality Date  . BLADDER SURGERY     tie up in 2013  . EYE SURGERY  11/2016   Bilateral Cataract Sx  . TUBAL LIGATION       Objective:  Med list reviewed  Current Outpatient Medications:  .  aspirin 81 MG tablet, Take 81 mg by mouth daily., Disp: , Rfl:  .  calcium-vitamin D (OSCAL WITH D) 500-200 MG-UNIT per tablet, Take 1 tablet by mouth daily., Disp: , Rfl:  .  cholecalciferol (VITAMIN D) 1000 UNITS tablet, Take 1,000 Units by mouth daily., Disp: , Rfl:  .  desoximetasone (TOPICORT) 0.05 % cream, Apply topically 2 (two) times daily., Disp: 60 g, Rfl: 1 .  gabapentin (NEURONTIN) 300 MG capsule, Take one tablet by mouth in the morning and one tablet by mouth in the evening for pains, Disp: 180 capsule, Rfl: 3 .  glipiZIDE (GLUCOTROL) 5 MG tablet, TAKE 1 TABLET DAILY, Disp: 90 tablet, Rfl: 1 .  glucose blood (FREESTYLE LITE) test strip, Use to test blood sugar once daily. Dx: E11.21,  Disp: 300 each, Rfl: 3 .  Lancets (FREESTYLE) lancets, Use to test blood sugar. Dx: E11.21, Disp: 300 each, Rfl: 3 .  lisinopril-hydrochlorothiazide (PRINZIDE,ZESTORETIC) 20-12.5 MG tablet, TAKE 1 TABLET DAILY TO CONTROL BLOOD PRESSURE, Disp: 90 tablet, Rfl: 3 .  oxyCODONE-acetaminophen (ROXICET) 5-325 MG tablet, Take 1 tablet by mouth every 8 (eight) hours as needed for severe pain., Disp: 90 tablet, Rfl: 0 .  pravastatin (PRAVACHOL) 20 MG tablet, TAKE 1 TABLET WITH EVENING MEAL, Disp: 90 tablet, Rfl: 1 .  TRADJENTA 5 MG TABS tablet, TAKE 1 TABLET DAILY, Disp: 90 tablet, Rfl: 1 .  vitamin E 400 UNIT capsule, Take 400 Units by mouth daily., Disp: , Rfl:  .  PEG-KCl-NaCl-NaSulf-Na Asc-C (PLENVU) 140 g SOLR, Take 140 g by mouth as directed., Disp: 1 each, Rfl: 0   Vital signs in last 24 hrs: Vitals:   10/03/18 1421  BP: 130/82  Pulse: 64    Physical Exam Weight down 4 more pounds from 9/18 visit  Slow, antalgic gait, gets on exam table without assistance.  Pleasant and conversational, no distress  HEENT: sclera anicteric, oral mucosa moist without lesions  Neck: supple, no thyromegaly, JVD or lymphadenopathy  Cardiac: RRR without murmurs, S1S2 heard, no peripheral edema  Pulm: clear to auscultation bilaterally, normal RR and effort noted  Abdomen: soft, no tenderness, with active bowel sounds. No guarding or palpable hepatosplenomegaly.  Skin; warm and dry, no jaundice or rash  Radiologic studies: Echo 2016 nml LVEF   @ASSESSMENTPLANBEGIN @ Assessment: Encounter Diagnoses  Name Primary?  . Chronic diarrhea Yes  . Abnormal loss of weight    Cause of diarrhea is unclear, I am most suspicious of microscopic colitis.  However, with the weight loss, we must consider small bowel/malabsorptive causes.  It would be unusual for sprue to come on at this age, and she did not get better with a trial of pancreatic enzymes.  I wonder about small bowel bacterial overgrowth since she has  long-standing diabetes.  However, she has reported allergies to cephalexin and metronidazole, which would make empiric therapy for SIBO challenging since rifaximin is expensive and difficult to obtain.   Plan: EGD and colonoscopy.  She is agreeable after discussion of procedure and risks.  The benefits and risks of the planned procedure were described in detail with the patient or (when appropriate) their health care proxy.  Risks were outlined as including, but not limited to, bleeding, infection, perforation, adverse medication reaction leading to cardiac or pulmonary decompensation, or pancreatitis (if ERCP).  The limitation of incomplete mucosal visualization was also discussed.  No guarantees or warranties were given.  Continued use of as needed Imodium, stay off Creon   Total time 25 minutes, over half spent face-to-face with patient in counseling and coordination of care.   Nelida Meuse III

## 2018-10-03 NOTE — Patient Instructions (Signed)
If you are age 82 or older, your body mass index should be between 23-30. Your Body mass index is 27.29 kg/m. If this is out of the aforementioned range listed, please consider follow up with your Primary Care Provider.  If you are age 66 or younger, your body mass index should be between 19-25. Your Body mass index is 27.29 kg/m. If this is out of the aformentioned range listed, please consider follow up with your Primary Care Provider.   You have been scheduled for an endoscopy and colonoscopy. Please follow the written instructions given to you at your visit today. Please pick up your prep supplies at the pharmacy within the next 1-3 days. If you use inhalers (even only as needed), please bring them with you on the day of your procedure. Your physician has requested that you go to www.startemmi.com and enter the access code given to you at your visit today. This web site gives a general overview about your procedure. However, you should still follow specific instructions given to you by our office regarding your preparation for the procedure.  It was a pleasure to see you today!  Dr. Loletha Carrow

## 2018-10-15 ENCOUNTER — Other Ambulatory Visit: Payer: Self-pay | Admitting: *Deleted

## 2018-10-15 MED ORDER — LISINOPRIL-HYDROCHLOROTHIAZIDE 20-12.5 MG PO TABS: ORAL_TABLET | ORAL | 3 refills | Status: DC

## 2018-10-15 NOTE — Telephone Encounter (Signed)
Patient requested refill Faxed to pharmacy.  

## 2018-10-30 ENCOUNTER — Other Ambulatory Visit: Payer: Self-pay

## 2018-10-30 DIAGNOSIS — M48062 Spinal stenosis, lumbar region with neurogenic claudication: Secondary | ICD-10-CM

## 2018-10-30 MED ORDER — OXYCODONE-ACETAMINOPHEN 5-325 MG PO TABS: 1.0000 | ORAL_TABLET | Freq: Three times a day (TID) | ORAL | 0 refills | Status: DC | PRN

## 2018-10-30 NOTE — Telephone Encounter (Signed)
Patient called to check status of refill. Patient aware rx is pending for approval

## 2018-10-30 NOTE — Telephone Encounter (Signed)
Last OV 07/02/2018 Last refill 09/13/2018

## 2018-10-30 NOTE — Telephone Encounter (Signed)
[place] Database verified and compliance confirmed   

## 2018-10-31 NOTE — Telephone Encounter (Signed)
Patient aware rx sent electronically

## 2018-11-05 ENCOUNTER — Telehealth: Payer: Self-pay | Admitting: Gastroenterology

## 2018-11-05 NOTE — Telephone Encounter (Signed)
Prep instruction reviewed with pt over the phone and questions were answered.

## 2018-11-05 NOTE — Telephone Encounter (Signed)
Pt has procedure on 11/12/18 and has some questions about it. Pls call her.

## 2018-11-06 ENCOUNTER — Ambulatory Visit (INDEPENDENT_AMBULATORY_CARE_PROVIDER_SITE_OTHER): Payer: Medicare Other | Admitting: Podiatry

## 2018-11-06 DIAGNOSIS — E1142 Type 2 diabetes mellitus with diabetic polyneuropathy: Secondary | ICD-10-CM | POA: Diagnosis not present

## 2018-11-06 DIAGNOSIS — M79609 Pain in unspecified limb: Secondary | ICD-10-CM

## 2018-11-06 DIAGNOSIS — B351 Tinea unguium: Secondary | ICD-10-CM

## 2018-11-06 DIAGNOSIS — L84 Corns and callosities: Secondary | ICD-10-CM | POA: Diagnosis not present

## 2018-11-06 NOTE — Patient Instructions (Signed)
Onychomycosis/Fungal Toenails  WHAT IS IT? An infection that lies within the keratin of your nail plate that is caused by a fungus.  WHY ME? Fungal infections affect all ages, sexes, races, and creeds.  There may be many factors that predispose you to a fungal infection such as age, coexisting medical conditions such as diabetes, or an autoimmune disease; stress, medications, fatigue, genetics, etc.  Bottom line: fungus thrives in a warm, moist environment and your shoes offer such a location.  IS IT CONTAGIOUS? Theoretically, yes.  You do not want to share shoes, nail clippers or files with someone who has fungal toenails.  Walking around barefoot in the same room or sleeping in the same bed is unlikely to transfer the organism.  It is important to realize, however, that fungus can spread easily from one nail to the next on the same foot.  HOW DO WE TREAT THIS?  There are several ways to treat this condition.  Treatment may depend on many factors such as age, medications, pregnancy, liver and kidney conditions, etc.  It is best to ask your doctor which options are available to you.  1. No treatment.   Unlike many other medical concerns, you can live with this condition.  However for many people this can be a painful condition and may lead to ingrown toenails or a bacterial infection.  It is recommended that you keep the nails cut short to help reduce the amount of fungal nail. 2. Topical treatment.  These range from herbal remedies to prescription strength nail lacquers.  About 40-50% effective, topicals require twice daily application for approximately 9 to 12 months or until an entirely new nail has grown out.  The most effective topicals are medical grade medications available through physicians offices. 3. Oral antifungal medications.  With an 80-90% cure rate, the most common oral medication requires 3 to 4 months of therapy and stays in your system for a year as the new nail grows out.  Oral  antifungal medications do require blood work to make sure it is a safe drug for you.  A liver function panel will be performed prior to starting the medication and after the first month of treatment.  It is important to have the blood work performed to avoid any harmful side effects.  In general, this medication safe but blood work is required. 4. Laser Therapy.  This treatment is performed by applying a specialized laser to the affected nail plate.  This therapy is noninvasive, fast, and non-painful.  It is not covered by insurance and is therefore, out of pocket.  The results have been very good with a 80-95% cure rate.  [facility] is the only practice in the area to offer this therapy. 5. Permanent Nail Avulsion.  Removing the entire nail so that a new nail will not grow back.  Diabetic Neuropathy Diabetic neuropathy is a nerve disease or nerve damage that is caused by diabetes mellitus. About half of all people with diabetes mellitus have some form of nerve damage. Nerve damage is more common in those who have had diabetes mellitus for many years and who generally have not had good control of their blood sugar (glucose) level. Diabetic neuropathy is a common complication of diabetes mellitus. There are three common types of diabetic neuropathy and a fourth type that is less common and less understood:  Peripheral neuropathy-This is the most common type of diabetic neuropathy. It causes damage to the nerves of the feet and  legs first and then eventually the hands and arms. The damage affects the ability to sense touch.  Autonomic neuropathy-This type causes damage to the autonomic nervous system, which controls the following functions: ? Heartbeat. ? Body temperature. ? Blood pressure. ? Urination. ? Digestion. ? Sweating. ? Sexual function.  Focal neuropathy-Focal neuropathy can be painful and unpredictable and occurs most often in older adults with diabetes mellitus. It involves a  specific nerve or one area and often comes on suddenly. It usually does not cause long-term problems.  Radiculoplexus neuropathy- Sometimes called lumbosacral radiculoplexus neuropathy, radiculoplexus neuropathy affects the nerves of the thighs, hips, buttocks, or legs. It is more common in people with type 2 diabetes mellitus and in older men. It is characterized by debilitating pain, weakness, and atrophy, usually in the thigh muscles.  What are the causes? The cause of peripheral, autonomic, and focal neuropathies is diabetes mellitus that is uncontrolled and high glucose levels. The cause of radiculoplexus neuropathy is unknown. However, it is thought to be caused by inflammation related to uncontrolled glucose levels. What are the signs or symptoms? Peripheral Neuropathy Peripheral neuropathy develops slowly over time. When the nerves of the feet and legs no longer work there may be:  Burning, stabbing, or aching pain in the legs or feet.  Inability to feel pressure or pain in your feet. This can lead to: ? Thick calluses over pressure areas. ? Pressure sores. ? Ulcers.  Foot deformities.  Reduced ability to feel temperature changes.  Muscle weakness.  Autonomic Neuropathy The symptoms of autonomic neuropathy vary depending on which nerves are affected. Symptoms may include:  Problems with digestion, such as: ? Feeling sick to your stomach (nausea). ? Vomiting. ? Bloating. ? Constipation. ? Diarrhea. ? Abdominal pain.  Difficulty with urination. This occurs if you lose your ability to sense when your bladder is full. Problems include: ? Urine leakage (incontinence). ? Inability to empty your bladder completely (retention).  Rapid or irregular heartbeat (palpitations).  Blood pressure drops when you stand up (orthostatic hypotension). When you stand up you may feel: ? Dizzy. ? Weak. ? Faint.  In men, inability to attain and maintain an erection.  In women, vaginal  dryness and problems with decreased sexual desire and arousal.  Problems with body temperature regulation.  Increased or decreased sweating.  Focal Neuropathy  Abnormal eye movements or abnormal alignment of both eyes.  Weakness in the wrist.  Foot drop. This results in an inability to lift the foot properly and abnormal walking or foot movement.  Paralysis on one side of your face (Bell palsy).  Chest or abdominal pain. Radiculoplexus Neuropathy  Sudden, severe pain in your hip, thigh, or buttocks.  Weakness and wasting of thigh muscles.  Difficulty rising from a seated position.  Abdominal swelling.  Unexplained weight loss (usually more than 10 lb [4.5 kg]). How is this diagnosed? Peripheral Neuropathy Your senses may be tested. Sensory function testing can be done with:  A light touch using a monofilament.  A vibration with tuning fork.  A sharp sensation with a pin prick.  Other tests that can help diagnose neuropathy are:  Nerve conduction velocity. This test checks the transmission of an electrical current through a nerve.  Electromyography. This shows how muscles respond to electrical signals transmitted by nearby nerves.  Quantitative sensory testing. This is used to assess how your nerves respond to vibrations and changes in temperature.  Autonomic Neuropathy Diagnosis is often based on reported symptoms. Tell your health  care provider if you experience:  Dizziness.  Constipation.  Diarrhea.  Inappropriate urination or inability to urinate.  Inability to get or maintain an erection.  Tests that may be done include:  Electrocardiography or Holter monitor. These are tests that can help show problems with the heart rate or heart rhythm.  An X-ray exam may be done.  Focal Neuropathy Diagnosis is made based on your symptoms and what your health care provider finds during your exam. Other tests may be done. They may include:  Nerve conduction  velocities. This checks the transmission of electrical current through a nerve.  Electromyography. This shows how muscles respond to electrical signals transmitted by nearby nerves.  Quantitative sensory testing. This test is used to assess how your nerves respond to vibration and changes in temperature.  Radiculoplexus Neuropathy  Often the first thing is to eliminate any other issue or problems that might be the cause, as there is no standard test for diagnosis.  X-ray exam of your spine and lumbar region.  Spinal tap to rule out cancer.  MRI to rule out other lesions. How is this treated? Once nerve damage occurs, it cannot be reversed. The goal of treatment is to keep the disease or nerve damage from getting worse and affecting more nerve fibers. Controlling your blood glucose level is the key. Most people with radiculoplexus neuropathy see at least a partial improvement over time. You will need to keep your blood glucose and HbA1c levels in the target range determined by your health care provider. Things that help control blood glucose levels include:  Blood glucose monitoring.  Meal planning.  Physical activity.  Diabetes medicine.  Over time, maintaining lower blood glucose levels helps lessen symptoms. Sometimes, prescription pain medicine is needed. Follow these instructions at home:  Do not smoke.  Keep your blood glucose level in the range that you and your health care provider have determined acceptable for you.  Keep your blood pressure level in the range that you and your health care provider have determined acceptable for you.  Eat a well-balanced diet.  Be physically active every day. Include strength training and balance exercises.  Protect your feet. ? Check your feet every day for sores, cuts, blisters, or signs of infection. ? Wear padded socks and supportive shoes. Use orthotic inserts, if necessary. ? Regularly check the insides of your shoes for worn  spots. Make sure there are no rocks or other items inside your shoes before you put them on. Contact a health care provider if:  You have burning, stabbing, or aching pain in the legs or feet.  You are unable to feel pressure or pain in your feet.  You develop problems with digestion such as: ? Nausea. ? Vomiting. ? Bloating. ? Constipation. ? Diarrhea. ? Abdominal pain.  You have difficulty with urination, such as: ? Incontinence. ? Retention.  You have palpitations.  You develop orthostatic hypotension. When you stand up you may feel: ? Dizzy. ? Weak. ? Faint.  You cannot attain and maintain an erection (in men).  You have vaginal dryness and problems with decreased sexual desire and arousal (in women).  You have severe pain in your thighs, legs, or buttocks.  You have unexplained weight loss. This information is not intended to replace advice given to you by your health care provider. Make sure you discuss any questions you have with your health care provider. Document Released: 02/06/2002 Document Revised: 05/05/2016 Document Reviewed: 05/09/2013 Elsevier Interactive Patient Education  2017  [place].  Diabetes and Foot Care Diabetes may cause you to have problems because of poor blood supply (circulation) to your feet and legs. This may cause the skin on your feet to become thinner, break easier, and heal more slowly. Your skin may become dry, and the skin may peel and crack. You may also have nerve damage in your legs and feet causing decreased feeling in them. You may not notice minor injuries to your feet that could lead to infections or more serious problems. Taking care of your feet is one of the most important things you can do for yourself. Follow these instructions at home:  Wear shoes at all times, even in the house. Do not go barefoot. Bare feet are easily injured.  Check your feet daily for blisters, cuts, and redness. If you cannot see the bottom of  your feet, use a mirror or ask someone for help.  Wash your feet with warm water (do not use hot water) and mild soap. Then pat your feet and the areas between your toes until they are completely dry. Do not soak your feet as this can dry your skin.  Apply a moisturizing lotion or petroleum jelly (that does not contain alcohol and is unscented) to the skin on your feet and to dry, brittle toenails. Do not apply lotion between your toes.  Trim your toenails straight across. Do not dig under them or around the cuticle. File the edges of your nails with an emery board or nail file.  Do not cut corns or calluses or try to remove them with medicine.  Wear clean socks or stockings every day. Make sure they are not too tight. Do not wear knee-high stockings since they may decrease blood flow to your legs.  Wear shoes that fit properly and have enough cushioning. To break in new shoes, wear them for just a few hours a day. This prevents you from injuring your feet. Always look in your shoes before you put them on to be sure there are no objects inside.  Do not cross your legs. This may decrease the blood flow to your feet.  If you find a minor scrape, cut, or break in the skin on your feet, keep it and the skin around it clean and dry. These areas may be cleansed with mild soap and water. Do not cleanse the area with peroxide, alcohol, or iodine.  When you remove an adhesive bandage, be sure not to damage the skin around it.  If you have a wound, look at it several times a day to make sure it is healing.  Do not use heating pads or hot water bottles. They may burn your skin. If you have lost feeling in your feet or legs, you may not know it is happening until it is too late.  Make sure your health care provider performs a complete foot exam at least annually or more often if you have foot problems. Report any cuts, sores, or bruises to your health care provider immediately. Contact a health care  provider if:  You have an injury that is not healing.  You have cuts or breaks in the skin.  You have an ingrown nail.  You notice redness on your legs or feet.  You feel burning or tingling in your legs or feet.  You have pain or cramps in your legs and feet.  Your legs or feet are numb.  Your feet always feel cold. Get help right away if:  There is increasing redness, swelling, or pain in or around a wound.  There is a red line that goes up your leg.  Pus is coming from a wound.  You develop a fever or as directed by your health care provider.  You notice a bad smell coming from an ulcer or wound. This information is not intended to replace advice given to you by your health care provider. Make sure you discuss any questions you have with your health care provider. Document Released: 11/25/2000 Document Revised: 05/05/2016 Document Reviewed: 05/07/2013 Elsevier Interactive Patient Education  2017 [employer].

## 2018-11-07 ENCOUNTER — Telehealth: Payer: Self-pay | Admitting: *Deleted

## 2018-11-07 ENCOUNTER — Telehealth: Payer: Self-pay | Admitting: Gastroenterology

## 2018-11-07 NOTE — Telephone Encounter (Signed)
Patient notified and agreed.  

## 2018-11-07 NOTE — Telephone Encounter (Signed)
Patient called back. Patient stating she does have a cold. Denies discolored sputum,dyspnea or weakness. Patient afebrile. Patient stating she did not know whether she should call her PCP. Encouraged the patient to follow through and contact the MD since she has had the cold since Sunday. Patient will call us back if her condition worsens or she become febrile. Procedure for Monday is still on schedule

## 2018-11-07 NOTE — Telephone Encounter (Signed)
Patient c/o cold, upper respiratory symptoms or sinus infection  1.) How long have you had your symptoms? Started over the weekend  2.) Any fever, if yes did you check with a thermometer? No Fever, does not have thermometer  3.) Any myalgias (muscle aches)? No, just a worsening cough  4.) Are you coughing up mucus, if yes is it discolored? Yes, yellow  5.) What have you tried for symptoms? Nothing  6.) Have you been around anyone sick? No   I will forward your message to your provider and call with response. If your symptoms progress please seek medical attention at Urgent Care

## 2018-11-07 NOTE — Telephone Encounter (Signed)
Pt called to inform that she has had a cold since Sunday, she wants to know if could have her proc scheduled on Monday 11/12/18. Pls call her.

## 2018-11-07 NOTE — Telephone Encounter (Signed)
I recommend she be seen early next week at our office unless she develops fever, is unable to eat and drink well in which case she should proceed to the urgent care.  In the interim, she may use otc diabetic tussin for the cough, drink plenty of water, and rest.

## 2018-11-12 ENCOUNTER — Encounter: Payer: Self-pay | Admitting: Gastroenterology

## 2018-11-12 ENCOUNTER — Other Ambulatory Visit: Payer: Medicare Other

## 2018-11-12 ENCOUNTER — Ambulatory Visit (AMBULATORY_SURGERY_CENTER): Payer: Medicare Other | Admitting: Gastroenterology

## 2018-11-12 ENCOUNTER — Other Ambulatory Visit: Payer: Self-pay

## 2018-11-12 VITALS — BP 120/78 | HR 86 | Temp 96.0°F | Resp 16 | Ht 65.0 in | Wt 164.0 lb

## 2018-11-12 DIAGNOSIS — R634 Abnormal weight loss: Secondary | ICD-10-CM | POA: Diagnosis not present

## 2018-11-12 DIAGNOSIS — K6389 Other specified diseases of intestine: Secondary | ICD-10-CM | POA: Diagnosis not present

## 2018-11-12 DIAGNOSIS — K529 Noninfective gastroenteritis and colitis, unspecified: Secondary | ICD-10-CM | POA: Diagnosis not present

## 2018-11-12 MED ORDER — SODIUM CHLORIDE 0.9 % IV SOLN: 500.0000 mL | Freq: Once | INTRAVENOUS | Status: DC

## 2018-11-12 NOTE — Progress Notes (Signed)
Report given to PACU, vss 

## 2018-11-12 NOTE — Op Note (Signed)
[facility] Patient Name: Dawn [name] Procedure Date: 11/12/2018 1:23 PM MRN: [id number] Endoscopist: Mallie Mussel L. Loletha Carrow , MD Age: 84 Referring MD:  Date of Birth: 1928/01/04 Gender: Female Account #: [id number] Procedure:                Colonoscopy Indications:              Chronic diarrhea, Weight loss Medicines:                Monitored Anesthesia Care Procedure:                Pre-Anesthesia Assessment:                           - Prior to the procedure, a History and Physical                            was performed, and patient medications and                            allergies were reviewed. The patient's tolerance of                            previous anesthesia was also reviewed. The risks                            and benefits of the procedure and the sedation                            options and risks were discussed with the patient.                            All questions were answered, and informed consent                            was obtained. Prior Anticoagulants: The patient has                            taken no previous anticoagulant or antiplatelet                            agents. ASA Grade Assessment: III - A patient with                            severe systemic disease. After reviewing the risks                            and benefits, the patient was deemed in                            satisfactory condition to undergo the procedure.                           After obtaining informed consent, the colonoscope  was passed under direct vision. Throughout the                            procedure, the patient's blood pressure, pulse, and                            oxygen saturations were monitored continuously. The                            Colonoscope was introduced through the anus and                            advanced to the the cecum, identified by                            appendiceal orifice and ileocecal valve.  The                            colonoscopy was performed without difficulty. The                            patient tolerated the procedure well. The quality                            of the bowel preparation was good except for a                            small amount of retained stool in the rectum. Scope In: 1:54:29 PM Scope Out: 2:05:52 PM Scope Withdrawal Time: 0 hours 8 minutes 14 seconds  Total Procedure Duration: 0 hours 11 minutes 23 seconds  Findings:                 The digital rectal exam findings include decreased                            sphincter tone.                           Diverticula were found in the entire colon.                           Normal mucosa was found in the entire colon.                            Biopsies for histology were taken with a cold                            forceps from the right colon and left colon for                            evaluation of microscopic colitis.                           Retroflexion in the rectum was not performed due to  anatomy.                           The exam was otherwise without abnormality. Complications:            No immediate complications. Estimated Blood Loss:     Estimated blood loss was minimal. Impression:               - Decreased sphincter tone found on digital rectal                            exam.                           - Diverticulosis in the entire examined colon.                           - Normal mucosa in the entire examined colon.                            Biopsied.                           - The examination was otherwise normal. Recommendation:           - Patient has a contact number available for                            emergencies. The signs and symptoms of potential                            delayed complications were discussed with the                            patient. Return to normal activities tomorrow.                            Written  discharge instructions were provided to the                            patient.                           - Resume previous diet.                           - Continue present medications.                           - Await pathology results.                           - No repeat routine colonoscopy due to age.                           - See the other procedure note for documentation of  additional recommendations. Henry L. Loletha Carrow, MD 11/12/2018 2:12:28 PM This report has been signed electronically.

## 2018-11-12 NOTE — Patient Instructions (Signed)
Discharge instructions given. Handout on Diverticulosis. Biopsies taken. Resume previous medications. YOU HAD AN ENDOSCOPIC PROCEDURE TODAY AT [facility]:   Refer to the procedure report that was given to you for any specific questions about what was found during the examination.  If the procedure report does not answer your questions, please call your gastroenterologist to clarify.  If you requested that your care partner not be given the details of your procedure findings, then the procedure report has been included in a sealed envelope for you to review at your convenience later.  YOU SHOULD EXPECT: Some feelings of bloating in the abdomen. Passage of more gas than usual.  Walking can help get rid of the air that was put into your GI tract during the procedure and reduce the bloating. If you had a lower endoscopy (such as a colonoscopy or flexible sigmoidoscopy) you may notice spotting of blood in your stool or on the toilet paper. If you underwent a bowel prep for your procedure, you may not have a normal bowel movement for a few days.  Please Note:  You might notice some irritation and congestion in your nose or some drainage.  This is from the oxygen used during your procedure.  There is no need for concern and it should clear up in a day or so.  SYMPTOMS TO REPORT IMMEDIATELY:   Following lower endoscopy (colonoscopy or flexible sigmoidoscopy):  Excessive amounts of blood in the stool  Significant tenderness or worsening of abdominal pains  Swelling of the abdomen that is new, acute  Fever of 100F or higher   Following upper endoscopy (EGD)  Vomiting of blood or coffee ground material  New chest pain or pain under the shoulder blades  Painful or persistently difficult swallowing  New shortness of breath  Fever of 100F or higher  Black, tarry-looking stools  For urgent or emergent issues, a gastroenterologist can be reached at any hour by calling [phone]  [phone].   DIET:  We do recommend a small meal at first, but then you may proceed to your regular diet.  Drink plenty of fluids but you should avoid alcoholic beverages for 24 hours.  ACTIVITY:  You should plan to take it easy for the rest of today and you should NOT DRIVE or use heavy machinery until tomorrow (because of the sedation medicines used during the test).    FOLLOW UP: Our staff will call the number listed on your records the next business day following your procedure to check on you and address any questions or concerns that you may have regarding the information given to you following your procedure. If we do not reach you, we will leave a message.  However, if you are feeling well and you are not experiencing any problems, there is no need to return our call.  We will assume that you have returned to your regular daily activities without incident.  If any biopsies were taken you will be contacted by phone or by letter within the next 1-3 weeks.  Please call us at [phone] if you have not heard about the biopsies in 3 weeks.    SIGNATURES/CONFIDENTIALITY: You and/or your care partner have signed paperwork which will be entered into your electronic medical record.  These signatures attest to the fact that that the information above on your After Visit Summary has been reviewed and is understood.  Full responsibility of the confidentiality of this discharge information lies with you and/or your care-partner.

## 2018-11-12 NOTE — Op Note (Signed)
[place] Patient Name: Dawn [name] Procedure Date: 11/12/2018 1:24 PM MRN: [id number] Endoscopist: Mallie Mussel L. Loletha Carrow , MD Age: 84 Referring MD:  Date of Birth: 1928/01/10 Gender: Female Account #: [id number] Procedure:                Upper GI endoscopy Indications:              Diarrhea, Weight loss Medicines:                Monitored Anesthesia Care Procedure:                Pre-Anesthesia Assessment:                           - Prior to the procedure, a History and Physical                            was performed, and patient medications and                            allergies were reviewed. The patient's tolerance of                            previous anesthesia was also reviewed. The risks                            and benefits of the procedure and the sedation                            options and risks were discussed with the patient.                            All questions were answered, and informed consent                            was obtained. Prior Anticoagulants: The patient has                            taken no previous anticoagulant or antiplatelet                            agents. ASA Grade Assessment: III - A patient with                            severe systemic disease. After reviewing the risks                            and benefits, the patient was deemed in                            satisfactory condition to undergo the procedure.                           After obtaining informed consent, the endoscope was  passed under direct vision. Throughout the                            procedure, the patient's blood pressure, pulse, and                            oxygen saturations were monitored continuously. The                            Endoscope was introduced through the mouth, and                            advanced to the second part of duodenum. The upper                            GI endoscopy was accomplished  without difficulty.                            The patient tolerated the procedure well. Scope In: Scope Out: Findings:                 The larynx was normal.                           The esophagus was normal.                           The stomach was normal.                           The cardia and gastric fundus were normal on                            retroflexion.                           The examined duodenum was normal. Biopsies were                            taken with a cold forceps for histology to rule out                            changes of celiac sprue or SIBO. Complications:            No immediate complications. Estimated Blood Loss:     Estimated blood loss was minimal. Impression:               - Normal larynx.                           - Normal esophagus.                           - Normal stomach.                           - Normal examined duodenum. Biopsied. Recommendation:           -  Patient has a contact number available for                            emergencies. The signs and symptoms of potential                            delayed complications were discussed with the                            patient. Return to normal activities tomorrow.                            Written discharge instructions were provided to the                            patient.                           - Resume previous diet.                           - Continue present medications.                           - Await pathology results.                           - See the other procedure note for documentation of                            additional recommendations. Henry L. Loletha Carrow, MD 11/12/2018 2:09:04 PM This report has been signed electronically.

## 2018-11-12 NOTE — Progress Notes (Signed)
Called to room to assist during endoscopic procedure.  Patient ID and intended procedure confirmed with present staff. Received instructions for my participation in the procedure from the performing physician.  

## 2018-11-13 ENCOUNTER — Telehealth: Payer: Self-pay

## 2018-11-13 NOTE — Telephone Encounter (Signed)
  Follow up Call-  Call back number 11/12/2018  Post procedure Call Back phone  # [phone]  Permission to leave phone message Yes  Some recent data might be hidden     Patient questions:  Do you have a fever, pain , or abdominal swelling? No. Pain Score  0 *  Have you tolerated food without any problems? Yes.    Have you been able to return to your normal activities? Yes.    Do you have any questions about your discharge instructions: Diet   No. Medications  No. Follow up visit  No.  Do you have questions or concerns about your Care? No.  Actions: * If pain score is 4 or above: No action needed, pain <4.

## 2018-11-14 ENCOUNTER — Emergency Department (HOSPITAL_COMMUNITY): Payer: Medicare Other

## 2018-11-14 ENCOUNTER — Emergency Department (HOSPITAL_COMMUNITY)
Admission: EM | Admit: 2018-11-14 | Discharge: 2018-11-15 | Disposition: A | Discharge status: Home / Self Care | Payer: Medicare Other | Attending: Emergency Medicine | Admitting: Emergency Medicine

## 2018-11-14 ENCOUNTER — Encounter (HOSPITAL_COMMUNITY): Payer: Self-pay

## 2018-11-14 DIAGNOSIS — N183 Chronic kidney disease, stage 3 (moderate): Secondary | ICD-10-CM | POA: Insufficient documentation

## 2018-11-14 DIAGNOSIS — K529 Noninfective gastroenteritis and colitis, unspecified: Secondary | ICD-10-CM

## 2018-11-14 DIAGNOSIS — E860 Dehydration: Secondary | ICD-10-CM | POA: Insufficient documentation

## 2018-11-14 DIAGNOSIS — Z79899 Other long term (current) drug therapy: Secondary | ICD-10-CM | POA: Insufficient documentation

## 2018-11-14 DIAGNOSIS — E1122 Type 2 diabetes mellitus with diabetic chronic kidney disease: Secondary | ICD-10-CM | POA: Diagnosis not present

## 2018-11-14 DIAGNOSIS — E1165 Type 2 diabetes mellitus with hyperglycemia: Secondary | ICD-10-CM | POA: Diagnosis not present

## 2018-11-14 DIAGNOSIS — Z87891 Personal history of nicotine dependence: Secondary | ICD-10-CM | POA: Insufficient documentation

## 2018-11-14 DIAGNOSIS — Z7984 Long term (current) use of oral hypoglycemic drugs: Secondary | ICD-10-CM | POA: Diagnosis not present

## 2018-11-14 DIAGNOSIS — R1013 Epigastric pain: Secondary | ICD-10-CM | POA: Diagnosis present

## 2018-11-14 DIAGNOSIS — Z7982 Long term (current) use of aspirin: Secondary | ICD-10-CM | POA: Diagnosis not present

## 2018-11-14 DIAGNOSIS — I129 Hypertensive chronic kidney disease with stage 1 through stage 4 chronic kidney disease, or unspecified chronic kidney disease: Secondary | ICD-10-CM | POA: Insufficient documentation

## 2018-11-14 LAB — CBC
HCT: 37.6 % (ref 36.0–46.0)
Hemoglobin: 12 g/dL (ref 12.0–15.0)
MCH: 27 pg (ref 26.0–34.0)
MCHC: 31.9 g/dL (ref 30.0–36.0)
MCV: 84.5 fL (ref 80.0–100.0)
Platelets: 220 10*3/uL (ref 150–400)
RBC: 4.45 MIL/uL (ref 3.87–5.11)
RDW: 14.8 % (ref 11.5–15.5)
WBC: 12.6 10*3/uL — ABNORMAL HIGH (ref 4.0–10.5)
nRBC: 0 % (ref 0.0–0.2)

## 2018-11-14 LAB — CBG MONITORING, ED: GLUCOSE-CAPILLARY: 217 mg/dL — AB (ref 70–99)

## 2018-11-14 LAB — BASIC METABOLIC PANEL
Anion gap: 14 (ref 5–15)
BUN: 24 mg/dL — ABNORMAL HIGH (ref 8–23)
CHLORIDE: 108 mmol/L (ref 98–111)
CO2: 19 mmol/L — ABNORMAL LOW (ref 22–32)
Calcium: 9.4 mg/dL (ref 8.9–10.3)
Creatinine, Ser: 1.49 mg/dL — ABNORMAL HIGH (ref 0.44–1.00)
GFR calc Af Amer: 36 mL/min — ABNORMAL LOW (ref >60)
GFR calc non Af Amer: 31 mL/min — ABNORMAL LOW (ref >60)
Glucose, Bld: 254 mg/dL — ABNORMAL HIGH (ref 70–99)
Potassium: 4 mmol/L (ref 3.5–5.1)
SODIUM: 141 mmol/L (ref 135–145)

## 2018-11-14 MED ORDER — LACTATED RINGERS IV BOLUS
1000.0000 mL | Freq: Once | INTRAVENOUS | Status: AC
  Administered 2018-11-14: 1000 mL via INTRAVENOUS

## 2018-11-14 MED ORDER — IOHEXOL 300 MG/ML  SOLN
80.0000 mL | Freq: Once | INTRAMUSCULAR | Status: AC | PRN
  Administered 2018-11-14: 80 mL via INTRAVENOUS

## 2018-11-14 NOTE — ED Notes (Signed)
CBC and BMP collected from Pt at triage; I-Stat also collected and on rocker in [facility].

## 2018-11-14 NOTE — ED Provider Notes (Signed)
[facility] EMERGENCY DEPARTMENT Provider Note   CSN: [id number] Arrival date & time: 11/14/18  1900     History   Chief Complaint Chief Complaint  Patient presents with  . Hyperglycemia    HPI Dawn [name] is a 84 y.o. female.  HPI   Very pleasant 84 year old female with past medical history as below including hypertension, diabetes, CKD, here with abdominal pain and generalized weakness.  The patient recently underwent EGD and colonoscopy on Monday.  Since then, she has been generally fatigued.  She has had a mild cough and nasal congestion that began before the colonoscopy, so she attributed it to having a cold.  However, she slept throughout most of the day on Tuesday and had very little to eat or drink.  She also did not take her medications because she got confused while she was allowed to take them.  Throughout the day today, the patient has had intermittent, cramp-like, epigastric and diffuse abdominal pain.  She notes one episode of red blood in her stool, but states this has not been persistent.  She states that the EGD and colonoscopy were normal per her report.  Denies any fevers.  She feels generally fatigued.  No urinary symptoms.  No nausea or vomiting.  No other complaints.  Past Medical History:  Diagnosis Date  . Anemia, unspecified   . Anorexia   . Chronic kidney disease   . Chronic kidney disease, stage III (moderate) (HCC)   . Cystocele, midline   . Disorder of bone and cartilage, unspecified   . Kyphosis associated with other condition   . Obesity, unspecified   . Osteoarthrosis, unspecified whether generalized or localized, unspecified site   . Other and unspecified hyperlipidemia   . Other hammer toe (acquired)   . Postmenopausal atrophic vaginitis   . PVC's (premature ventricular contractions) 01/29/2015  . RBBB 01/29/2015  . Reflux esophagitis   . Type II or unspecified type diabetes mellitus without mention of complication, not stated  as uncontrolled   . Unspecified constipation   . Unspecified disorder of kidney and ureter   . Unspecified essential hypertension   . Unspecified hereditary and idiopathic peripheral neuropathy   . Unspecified urinary incontinence     Patient Active Problem List   Diagnosis Date Noted  . Advance care planning 11/20/2017  . PVC's (premature ventricular contractions) 01/29/2015  . RBBB 01/29/2015  . Hyperlipidemia 01/06/2015  . Osteopenia 01/06/2015  . Benign essential HTN 07/29/2014  . Weight gain 07/29/2014  . Therapeutic opioid induced constipation 07/29/2014  . Spinal stenosis, lumbar region, with neurogenic claudication 04/30/2014  . Neuropathic pain 03/05/2014  . Bilateral leg pain 02/05/2014  . DM type 2 with diabetic peripheral neuropathy ([place]) 02/05/2014  . Unspecified constipation 01/08/2014  . Leg pain 01/08/2014  . Routine general medical examination at a health care facility 01/08/2014  . DM type 2, uncontrolled, with renal complications ([place]) 16/96/7893  . Hypertensive renal disease 10/08/2013  . Need for prophylactic vaccination and inoculation against influenza 10/08/2013  . Generalized OA 07/23/2013  . UTI (urinary tract infection) 06/19/2013  . Hypoglycemia 06/19/2013  . Diabetes mellitus with nephropathy ([place]) 03/21/2013  . Osteoarthritis   . Other and unspecified hyperlipidemia   . Chronic kidney disease     Past Surgical History:  Procedure Laterality Date  . BLADDER SURGERY     tie up in 2013  . EYE SURGERY  11/2016   Bilateral Cataract Sx  . TUBAL LIGATION  OB History   None      Home Medications    Prior to Admission medications   Medication Sig Start Date End Date Taking? Authorizing Provider  amoxicillin-clavulanate (AUGMENTIN) 875-125 MG tablet Take 1 tablet by mouth every 12 (twelve) hours for 7 days. 11/15/18 11/22/18  Duffy Bruce, MD  aspirin 81 MG tablet Take 81 mg by mouth daily.    [provider]    calcium-vitamin D (OSCAL WITH D) 500-200 MG-UNIT per tablet Take 1 tablet by mouth daily.    [provider]  cholecalciferol (VITAMIN D) 1000 UNITS tablet Take 1,000 Units by mouth daily.    [provider]  desoximetasone (TOPICORT) 0.05 % cream Apply topically 2 (two) times daily. Patient not taking: Reported on 11/12/2018 02/13/18   Hollace Kinnier L, DO  gabapentin (NEURONTIN) 300 MG capsule Take one tablet by mouth in the morning and one tablet by mouth in the evening for pains 08/21/18   Reed, Tiffany L, DO  glipiZIDE (GLUCOTROL) 5 MG tablet TAKE 1 TABLET DAILY 09/24/18   Reed, Tiffany L, DO  glucose blood (FREESTYLE LITE) test strip Use to test blood sugar once daily. Dx: E11.21 08/21/18   Reed, Tiffany L, DO  Lancets (FREESTYLE) lancets Use to test blood sugar. Dx: E11.21 02/08/18   Gayland Curry, DO  lisinopril-hydrochlorothiazide (PRINZIDE,ZESTORETIC) 20-12.5 MG tablet Take one tablet by mouth once daily to control blood pressure 10/15/18   Reed, Tiffany L, DO  ondansetron (ZOFRAN ODT) 4 MG disintegrating tablet Take 1 tablet (4 mg total) by mouth every 8 (eight) hours as needed for nausea or vomiting. 11/15/18   Duffy Bruce, MD  oxyCODONE-acetaminophen (ROXICET) 5-325 MG tablet Take 1 tablet by mouth every 8 (eight) hours as needed for severe pain. 10/30/18   Reed, Tiffany L, DO  pravastatin (PRAVACHOL) 20 MG tablet TAKE 1 TABLET WITH EVENING MEAL 05/04/18   Reed, Tiffany L, DO  TRADJENTA 5 MG TABS tablet TAKE 1 TABLET DAILY 09/24/18   Reed, Tiffany L, DO  vitamin E 400 UNIT capsule Take 400 Units by mouth daily.    [provider]    Family History Family History  Problem Relation Age of Onset  . Heart disease Mother   . Heart disease Sister   . Cancer Son   . Cancer Son   . Stomach cancer Neg Hx   . Colon cancer Neg Hx   . Pancreatic cancer Neg Hx     Social History Social History   Tobacco Use  . Smoking status: Former Smoker    Last attempt to  quit: 12/12/1968    Years since quitting: 49.9  . Smokeless tobacco: Never Used  . Tobacco comment: quit in 1970  Substance Use Topics  . Alcohol use: No    Alcohol/week: 0.0 standard drinks  . Drug use: No     Allergies   Keflex [cephalexin]; Metronidazole; and Simvastatin   Review of Systems Review of Systems  Constitutional: Positive for fatigue. Negative for chills and fever.  HENT: Negative for congestion and rhinorrhea.   Eyes: Negative for visual disturbance.  Respiratory: Negative for cough, shortness of breath and wheezing.   Cardiovascular: Negative for chest pain and leg swelling.  Gastrointestinal: Positive for abdominal pain and nausea. Negative for diarrhea and vomiting.  Genitourinary: Negative for dysuria and flank pain.  Musculoskeletal: Negative for neck pain and neck stiffness.  Skin: Negative for rash and wound.  Allergic/Immunologic: Negative for immunocompromised state.  Neurological: Positive for weakness.  Negative for syncope and headaches.  All other systems reviewed and are negative.    Physical Exam Updated Vital Signs BP (!) 170/85   Pulse 95   Temp 99 F (37.2 C) (Rectal)   Resp 16   SpO2 98%   Physical Exam  Constitutional: She is oriented to person, place, and time. She appears well-developed and well-nourished. No distress.  HENT:  Head: Normocephalic and atraumatic.  Dry mucous membranes  Eyes: Conjunctivae are normal.  Neck: Neck supple.  Cardiovascular: Normal rate, regular rhythm and normal heart sounds. Exam reveals no friction rub.  No murmur heard. Pulmonary/Chest: Effort normal and breath sounds normal. No respiratory distress. She has no wheezes. She has no rales.  Abdominal: Soft. Bowel sounds are normal. She exhibits no distension. There is no tenderness. There is no rebound and no guarding.  Musculoskeletal: She exhibits no edema.  Neurological: She is alert and oriented to person, place, and time. She exhibits normal  muscle tone.  Skin: Skin is warm. Capillary refill takes less than 2 seconds.  Psychiatric: She has a normal mood and affect.  Nursing note and vitals reviewed.    ED Treatments / Results  Labs (all labs ordered are listed, but only abnormal results are displayed) Labs Reviewed  BASIC METABOLIC PANEL - Abnormal; Notable for the following components:      Result Value   CO2 19 (*)    Glucose, Bld 254 (*)    BUN 24 (*)    Creatinine, Ser 1.49 (*)    GFR calc non Af Amer 31 (*)    GFR calc Af Amer 36 (*)    All other components within normal limits  CBC - Abnormal; Notable for the following components:   WBC 12.6 (*)    All other components within normal limits  CBG MONITORING, ED - Abnormal; Notable for the following components:   Glucose-Capillary 217 (*)    All other components within normal limits  CBG MONITORING, ED - Abnormal; Notable for the following components:   Glucose-Capillary 163 (*)    All other components within normal limits  URINALYSIS, ROUTINE W REFLEX MICROSCOPIC  POC OCCULT BLOOD, ED  POC OCCULT BLOOD, ED  CBG MONITORING, ED    EKG None  Radiology Dg Chest 2 View  Result Date: 11/14/2018 CLINICAL DATA:  Hyperglycemia.  Diarrhea after recent colonoscopy. EXAM: CHEST - 2 VIEW COMPARISON:  None. FINDINGS: Cardiomediastinal silhouette is normal. Mildly calcified aortic arch. No pleural effusions or focal consolidations. Trachea projects midline and there is no pneumothorax. Soft tissue planes and included osseous structures are non-suspicious. Tubing overlies patient. Calcification LEFT neck is likely vascular. IMPRESSION: 1. No acute cardiopulmonary process. 2.  Aortic Atherosclerosis (ICD10-I70.0). Electronically Signed   By: Elon Alas M.D.   On: 11/14/2018 23:48   Ct Abdomen Pelvis W Contrast  Result Date: 11/15/2018 CLINICAL DATA:  84 year old female with diarrhea and abdominal pain. EXAM: CT ABDOMEN AND PELVIS WITH CONTRAST TECHNIQUE:  Multidetector CT imaging of the abdomen and pelvis was performed using the standard protocol following bolus administration of intravenous contrast. CONTRAST:  29mL OMNIPAQUE IOHEXOL 300 MG/ML  SOLN COMPARISON:  None. FINDINGS: Lower chest: The visualized lung bases are clear. No intra-abdominal free air. There is trace free fluid the pelvis. Hepatobiliary: The liver is unremarkable. No intrahepatic biliary ductal dilatation. The gallbladder is unremarkable. Pancreas: There is fatty atrophy of the pancreas. No dilatation of the main pancreatic duct. No active inflammatory changes. Spleen: Normal in size without focal  abnormality. Adrenals/Urinary Tract: There is an indeterminate 12 mm left adrenal nodule. The right adrenal gland is unremarkable. There is no hydronephrosis on either side. There is symmetric enhancement and excretion of contrast by both kidneys. Small bilateral renal cysts measuring up to 2 cm in the left kidney as well as subcentimeter hypodensities which are too small to characterize. The visualized ureters and urinary bladder appear unremarkable. Stomach/Bowel: There is mild diffuse thickened appearance of the colon predominantly involving the ascending colon likely representing mild colitis. Clinical correlation is recommended. An infiltrative mass involving the ascending colon is less likely but not entirely excluded. Follow-up after resolution of acute symptoms or correlation with colonoscopy is recommended. There is extensive colonic diverticulosis without active inflammatory changes. No bowel obstruction. The appendix is normal. Vascular/Lymphatic: Mild aortoiliac atherosclerotic disease. No portal venous gas. There is no adenopathy. Reproductive: The uterus and ovaries are grossly unremarkable. No pelvic mass. Other: None Musculoskeletal: Osteopenia with degenerative changes of the spine. No acute osseous pathology. IMPRESSION: 1. Findings likely represent mild colitis. Clinical correlation  is recommended. No bowel obstruction. Normal appendix. 2. Colonic diverticulosis without active inflammatory changes. No bowel obstruction. Normal appendix. Electronically Signed   By: Anner Crete M.D.   On: 11/15/2018 00:16    Procedures Procedures (including critical care time)  Medications Ordered in ED Medications  lactated ringers bolus 500 mL (500 mLs Intravenous New Bag/Given 11/15/18 0202)  lactated ringers bolus 1,000 mL (0 mLs Intravenous Stopped 11/15/18 0114)  iohexol (OMNIPAQUE) 300 MG/ML solution 80 mL (80 mLs Intravenous Contrast Given 11/14/18 2356)  piperacillin-tazobactam (ZOSYN) IVPB 3.375 g (0 g Intravenous Stopped 11/15/18 0154)  acetaminophen (TYLENOL) tablet 1,000 mg (1,000 mg Oral Given 11/15/18 0154)  ondansetron (ZOFRAN) injection 4 mg (4 mg Intravenous Given 11/15/18 0157)     Initial Impression / Assessment and Plan / ED Course  I have reviewed the triage vital signs and the nursing notes.  Pertinent labs & imaging results that were available during my care of the patient were reviewed by me and considered in my medical decision making (see chart for details).     Very pleasant 84 yo F here w/ mild abd pain, diarrhea, and now hyperglycemia. On arrival, pt AF, VSS and pt is very well appearing. Abdomen is soft.  Regarding her hyperglycemia, suspect this is multifactorial 2/2 not taking her DM meds as well as possible infection. CT scan obtained given recent colonoscopy is c/w mild colitis, which fits with her diarrheal illness. Labs show mild leukocytosis, likely dehydration but are o/w unremarkable. Hemoccult is neg. Pt is well appearing, tolerating PO in ED.  Will treat for early, mild colitis. Augmentin started and Zosyn given here. Given well appearance, stable vitals, will attempt outpatient management with good return precautions.. D/c home w/ 24-48 hours PCP check up.  Final Clinical Impressions(s) / ED Diagnoses   Final diagnoses:  Colitis    Dehydration    ED Discharge Orders         Ordered    amoxicillin-clavulanate (AUGMENTIN) 875-125 MG tablet  Every 12 hours     11/15/18 0137    ondansetron (ZOFRAN ODT) 4 MG disintegrating tablet  Every 8 hours PRN     11/15/18 0137           Duffy Bruce, MD 11/15/18 0206

## 2018-11-14 NOTE — ED Notes (Signed)
Patient transported to X-ray 

## 2018-11-14 NOTE — ED Triage Notes (Signed)
Pt has been having high sugar today in the 200's, had a colonoscopy on Monday and continues to have diarrhea, pt is concerned that she might have blood in her stool but is unsure. Has had a slight fever since yesterday with cold symptoms as well.

## 2018-11-15 ENCOUNTER — Ambulatory Visit: Payer: Medicare Other | Admitting: Internal Medicine

## 2018-11-15 DIAGNOSIS — K529 Noninfective gastroenteritis and colitis, unspecified: Secondary | ICD-10-CM | POA: Diagnosis not present

## 2018-11-15 LAB — CBG MONITORING, ED: GLUCOSE-CAPILLARY: 163 mg/dL — AB (ref 70–99)

## 2018-11-15 LAB — POC OCCULT BLOOD, ED: FECAL OCCULT BLD: NEGATIVE

## 2018-11-15 MED ORDER — ONDANSETRON HCL 4 MG/2ML IJ SOLN
4.0000 mg | Freq: Once | INTRAMUSCULAR | Status: AC
  Administered 2018-11-15: 4 mg via INTRAVENOUS
  Filled 2018-11-15: qty 2

## 2018-11-15 MED ORDER — AMOXICILLIN-POT CLAVULANATE 875-125 MG PO TABS: 1.0000 | ORAL_TABLET | Freq: Two times a day (BID) | ORAL | 0 refills | Status: AC

## 2018-11-15 MED ORDER — ONDANSETRON 4 MG PO TBDP: 4.0000 mg | ORAL_TABLET | Freq: Three times a day (TID) | ORAL | 0 refills | Status: DC | PRN

## 2018-11-15 MED ORDER — ACETAMINOPHEN 500 MG PO TABS
1000.0000 mg | ORAL_TABLET | Freq: Once | ORAL | Status: AC
  Administered 2018-11-15: 1000 mg via ORAL
  Filled 2018-11-15: qty 2

## 2018-11-15 MED ORDER — PIPERACILLIN-TAZOBACTAM 3.375 G IVPB 30 MIN
3.3750 g | Freq: Once | INTRAVENOUS | Status: AC
  Administered 2018-11-15: 3.375 g via INTRAVENOUS
  Filled 2018-11-15: qty 50

## 2018-11-15 MED ORDER — LACTATED RINGERS IV BOLUS
500.0000 mL | Freq: Once | INTRAVENOUS | Status: AC
  Administered 2018-11-15: 500 mL via INTRAVENOUS

## 2018-11-15 NOTE — ED Notes (Signed)
CBG 163 

## 2018-11-15 NOTE — Discharge Instructions (Addendum)
Drink plenty of fluids  Take the full course of antibiotics  I'd recommend seeing your GI doctor or primary doctor in 24-48 hours for a check-up

## 2018-11-15 NOTE — ED Notes (Signed)
Patient verbalizes understanding of discharge instructions. Opportunity for questioning and answers were provided. Armband removed by staff, pt discharged from ED. Pt wheeled to lobby taken home by family.

## 2018-11-15 NOTE — ED Notes (Signed)
ED Provider at bedside. 

## 2018-11-21 ENCOUNTER — Other Ambulatory Visit: Payer: Medicare Other

## 2018-11-21 DIAGNOSIS — N184 Chronic kidney disease, stage 4 (severe): Principal | ICD-10-CM

## 2018-11-21 DIAGNOSIS — E1122 Type 2 diabetes mellitus with diabetic chronic kidney disease: Secondary | ICD-10-CM

## 2018-11-22 ENCOUNTER — Other Ambulatory Visit: Payer: Self-pay

## 2018-11-22 DIAGNOSIS — R197 Diarrhea, unspecified: Secondary | ICD-10-CM

## 2018-11-22 LAB — CBC WITH DIFFERENTIAL/PLATELET
Basophils Absolute: 71 cells/uL (ref 0–200)
Basophils Relative: 0.7 %
Eosinophils Absolute: 535 cells/uL — ABNORMAL HIGH (ref 15–500)
Eosinophils Relative: 5.3 %
HCT: 34.4 % — ABNORMAL LOW (ref 35.0–45.0)
Hemoglobin: 10.8 g/dL — ABNORMAL LOW (ref 11.7–15.5)
Lymphs Abs: 2050 cells/uL (ref 850–3900)
MCH: 27.1 pg (ref 27.0–33.0)
MCHC: 31.4 g/dL — ABNORMAL LOW (ref 32.0–36.0)
MCV: 86.2 fL (ref 80.0–100.0)
MPV: 11.9 fL (ref 7.5–12.5)
Monocytes Relative: 8 %
Neutro Abs: 6636 cells/uL (ref 1500–7800)
Neutrophils Relative %: 65.7 %
Platelets: 199 10*3/uL (ref 140–400)
RBC: 3.99 10*6/uL (ref 3.80–5.10)
RDW: 13.6 % (ref 11.0–15.0)
Total Lymphocyte: 20.3 %
WBC mixed population: 808 cells/uL (ref 200–950)
WBC: 10.1 10*3/uL (ref 3.8–10.8)

## 2018-11-22 LAB — COMPLETE METABOLIC PANEL WITH GFR
AG Ratio: 1 (calc) (ref 1.0–2.5)
ALT: 20 U/L (ref 6–29)
AST: 22 U/L (ref 10–35)
Albumin: 3.4 g/dL — ABNORMAL LOW (ref 3.6–5.1)
Alkaline phosphatase (APISO): 98 U/L (ref 33–130)
BUN/Creatinine Ratio: 18 (calc) (ref 6–22)
BUN: 48 mg/dL — ABNORMAL HIGH (ref 7–25)
CO2: 25 mmol/L (ref 20–32)
Calcium: 9.4 mg/dL (ref 8.6–10.4)
Chloride: 103 mmol/L (ref 98–110)
Creat: 2.62 mg/dL — ABNORMAL HIGH (ref 0.60–0.88)
GFR, Est African American: 18 mL/min/{1.73_m2} — ABNORMAL LOW (ref >=60)
GFR, Est Non African American: 16 mL/min/{1.73_m2} — ABNORMAL LOW (ref >=60)
Globulin: 3.3 g/dL (calc) (ref 1.9–3.7)
Glucose, Bld: 103 mg/dL — ABNORMAL HIGH (ref 65–99)
Potassium: 4 mmol/L (ref 3.5–5.3)
Sodium: 139 mmol/L (ref 135–146)
Total Bilirubin: 0.5 mg/dL (ref 0.2–1.2)
Total Protein: 6.7 g/dL (ref 6.1–8.1)

## 2018-11-22 LAB — HEMOGLOBIN A1C
Hgb A1c MFr Bld: 6.3 % of total Hgb — ABNORMAL HIGH (ref <5.7)
Mean Plasma Glucose: 134 (calc)
eAG (mmol/L): 7.4 (calc)

## 2018-11-23 ENCOUNTER — Telehealth: Payer: Self-pay | Admitting: *Deleted

## 2018-11-23 NOTE — Telephone Encounter (Signed)
I recommend she take a dose of miralax today.  If no success, repeat this again in the morning.  Stay close to the bathroom.

## 2018-11-23 NOTE — Telephone Encounter (Signed)
Patient called and left message on Clinical intake stating that she had just spoke with [facility] regarding her Lab work but she forgot to tell [facility] to let Dr. Mariea Clonts know that she has not had a bowel movement in 2-3 days now. Wants to know what she can take for this. Hasn't taken anything. No distention or abdominal pain. Has an appointment scheduled for 12/16.  Please Advise.

## 2018-11-23 NOTE — Telephone Encounter (Signed)
Patient notified and agreed.  

## 2018-11-25 ENCOUNTER — Encounter: Payer: Self-pay | Admitting: Podiatry

## 2018-11-25 NOTE — Progress Notes (Signed)
Subjective: Dawn [name] presents with diabetes, diabetic neuropathy and cc of painful, discolored, thick toenails and painful corn right 4th toe which interfere with activities of daily living. Pain is aggravated when wearing enclosed shoe gear. Pain is relieved with periodic professional debridement.  She continues to take gabapentin for neuropathy feels it's working for her.  She voices no new problems on today's visit.  Reed, Tiffany L, DO ; last [facility] 07/02/2018   Current Outpatient Medications:  .  aspirin 81 MG tablet, Take 81 mg by mouth daily., Disp: , Rfl:  .  calcium-vitamin D (OSCAL WITH D) 500-200 MG-UNIT per tablet, Take 1 tablet by mouth daily., Disp: , Rfl:  .  cholecalciferol (VITAMIN D) 1000 UNITS tablet, Take 1,000 Units by mouth daily., Disp: , Rfl:  .  desoximetasone (TOPICORT) 0.05 % cream, Apply topically 2 (two) times daily. (Patient not taking: Reported on 11/12/2018), Disp: 60 g, Rfl: 1 .  gabapentin (NEURONTIN) 300 MG capsule, Take one tablet by mouth in the morning and one tablet by mouth in the evening for pains, Disp: 180 capsule, Rfl: 3 .  glipiZIDE (GLUCOTROL) 5 MG tablet, TAKE 1 TABLET DAILY, Disp: 90 tablet, Rfl: 1 .  glucose blood (FREESTYLE LITE) test strip, Use to test blood sugar once daily. Dx: E11.21, Disp: 300 each, Rfl: 3 .  Lancets (FREESTYLE) lancets, Use to test blood sugar. Dx: E11.21, Disp: 300 each, Rfl: 3 .  lisinopril-hydrochlorothiazide (PRINZIDE,ZESTORETIC) 20-12.5 MG tablet, Take one tablet by mouth once daily to control blood pressure, Disp: 90 tablet, Rfl: 3 .  oxyCODONE-acetaminophen (ROXICET) 5-325 MG tablet, Take 1 tablet by mouth every 8 (eight) hours as needed for severe pain., Disp: 90 tablet, Rfl: 0 .  pravastatin (PRAVACHOL) 20 MG tablet, TAKE 1 TABLET WITH EVENING MEAL, Disp: 90 tablet, Rfl: 1 .  TRADJENTA 5 MG TABS tablet, TAKE 1 TABLET DAILY, Disp: 90 tablet, Rfl: 1 .  vitamin E 400 UNIT capsule, Take 400 Units by mouth daily., Disp:  , Rfl:  .  ondansetron (ZOFRAN ODT) 4 MG disintegrating tablet, Take 1 tablet (4 mg total) by mouth every 8 (eight) hours as needed for nausea or vomiting., Disp: 15 tablet, Rfl: 0  Allergies  Allergen Reactions  . Keflex [Cephalexin]   . Metronidazole   . Simvastatin     Leg cramsps    Vascular Examination: Capillary refill time <3 seconds x 10 digits Dorsalis pedis and Posterior tibial pulses present 1/4 b/l + digital hair x 10 digits Skin temperature gradient WNL b/l  Dermatological Examination: Skin with normal turgor, texture and tone b/l  Toenails 1-5 b/l discolored, thick, dystrophic with subungual debris and pain with palpation to nailbeds due to thickness of nails.  Hyperkeratotic lesion dorsal 4th PIPJ right foot. No edema, no erythema, no flocculence, no drainage.  No open wounds. No interdigital macerations.  Musculoskeletal: Muscle strength 5/5 to all LE muscle groups  HAV with bunion b/l  Hammertoe deformity 2-5 b/l  Pes planus foot-type b/l  Neurological: Sensation diminished with 10 gram monofilament. Vibratory sensation diminished  Assessment: 1. Painful onychomycosis toenails 1-5 b/l 2. Corn right 4th digit 3. NIDDM with Diabetic neuropathy  Plan: 1. Continue diabetic foot care principles. Literature dispensed. 2. Toenails 1-5 b/l were debrided in length and girth without iatrogenic bleeding. 3. Hyperkeratotic lesion pared with sterile chisel blade right 4th digit. 4. Patient to continue soft, supportive shoe gear 5. Patient to report any pedal injuries to medical professional  6. Follow up 3 months.  Patient/POA to call should there be a concern in the interim.

## 2018-11-26 ENCOUNTER — Encounter: Payer: Self-pay | Admitting: Internal Medicine

## 2018-11-26 ENCOUNTER — Ambulatory Visit (INDEPENDENT_AMBULATORY_CARE_PROVIDER_SITE_OTHER): Payer: Medicare Other | Admitting: Internal Medicine

## 2018-11-26 VITALS — BP 120/70 | HR 89 | Temp 97.8°F | Ht 65.0 in | Wt 159.0 lb

## 2018-11-26 DIAGNOSIS — N179 Acute kidney failure, unspecified: Secondary | ICD-10-CM | POA: Diagnosis not present

## 2018-11-26 DIAGNOSIS — E782 Mixed hyperlipidemia: Secondary | ICD-10-CM

## 2018-11-26 DIAGNOSIS — K529 Noninfective gastroenteritis and colitis, unspecified: Secondary | ICD-10-CM

## 2018-11-26 DIAGNOSIS — N184 Chronic kidney disease, stage 4 (severe): Secondary | ICD-10-CM | POA: Diagnosis not present

## 2018-11-26 DIAGNOSIS — E1142 Type 2 diabetes mellitus with diabetic polyneuropathy: Secondary | ICD-10-CM

## 2018-11-26 DIAGNOSIS — Z23 Encounter for immunization: Secondary | ICD-10-CM

## 2018-11-26 MED ORDER — ZOSTER VAC RECOMB ADJUVANTED 50 MCG/0.5ML IM SUSR: 0.5000 mL | Freq: Once | INTRAMUSCULAR | 1 refills | Status: AC

## 2018-11-26 NOTE — Progress Notes (Addendum)
Location:  [facility] clinic Provider:  Corynne Scibilia L. Mariea Clonts, D.O., C.M.D.  Code Status: full code--needs discussion Goals of Care:  Advanced Directives 11/12/2018  Does Patient Have a Medical Advance Directive? -  Type of Advance Directive [place]  Does patient want to make changes to medical advance directive? -  Copy of [place] in Chart? -  Would patient like information on creating a medical advance directive? -     Chief Complaint  Patient presents with  . Medical Management of Chronic Issues    4MTH FOLLOW-UP    HPI: Patient is a 84 y.o. female seen today for medical management of chronic diseases.    She took a laxative when she got constipated on Saturday.  Still didn't have bm then until Sunday.  Then yesterday, she had diarrhea--2-3x before bed.  Has f/u 12/18/18.  Says she has good and bad days.  She has begun her Christmas cards.  Didn't feel well leading up to December.    Foods don't taste as good anymore.  Would like to be able to eat more.  Has less taste.  No particular bad flavor.  Has been trying to find something really good she likes a lot.  Oatmeal was alright vs cereal.  She used to have to cut back to keep her weight down.  She's down 5 lbs from 12/2.  She had the bowel prep between and loose bm last night.  Past Medical History:  Diagnosis Date  . Anemia, unspecified   . Anorexia   . Chronic kidney disease   . Chronic kidney disease, stage III (moderate) (HCC)   . Cystocele, midline   . Disorder of bone and cartilage, unspecified   . Kyphosis associated with other condition   . Obesity, unspecified   . Osteoarthrosis, unspecified whether generalized or localized, unspecified site   . Other and unspecified hyperlipidemia   . Other hammer toe (acquired)   . Postmenopausal atrophic vaginitis   . PVC's (premature ventricular contractions) 01/29/2015  . RBBB 01/29/2015  . Reflux esophagitis   . Type II or unspecified type  diabetes mellitus without mention of complication, not stated as uncontrolled   . Unspecified constipation   . Unspecified disorder of kidney and ureter   . Unspecified essential hypertension   . Unspecified hereditary and idiopathic peripheral neuropathy   . Unspecified urinary incontinence     Past Surgical History:  Procedure Laterality Date  . BLADDER SURGERY     tie up in 2013  . EYE SURGERY  11/2016   Bilateral Cataract Sx  . TUBAL LIGATION      Allergies  Allergen Reactions  . Keflex [Cephalexin]   . Metronidazole   . Simvastatin     Leg cramsps    Outpatient Encounter Medications as of 11/26/2018  Medication Sig  . aspirin 81 MG tablet Take 81 mg by mouth daily.  . calcium-vitamin D (OSCAL WITH D) 500-200 MG-UNIT per tablet Take 1 tablet by mouth daily.  . cholecalciferol (VITAMIN D) 1000 UNITS tablet Take 1,000 Units by mouth daily.  Marland Kitchen gabapentin (NEURONTIN) 300 MG capsule Take one tablet by mouth in the morning and one tablet by mouth in the evening for pains  . glipiZIDE (GLUCOTROL) 5 MG tablet TAKE 1 TABLET DAILY  . glucose blood (FREESTYLE LITE) test strip Use to test blood sugar once daily. Dx: E11.21  . Lancets (FREESTYLE) lancets Use to test blood sugar. Dx: E11.21  . lisinopril-hydrochlorothiazide (PRINZIDE,ZESTORETIC) 20-12.5  MG tablet Take one tablet by mouth once daily to control blood pressure  . ondansetron (ZOFRAN ODT) 4 MG disintegrating tablet Take 1 tablet (4 mg total) by mouth every 8 (eight) hours as needed for nausea or vomiting.  Marland Kitchen oxyCODONE-acetaminophen (ROXICET) 5-325 MG tablet Take 1 tablet by mouth every 8 (eight) hours as needed for severe pain.  . pravastatin (PRAVACHOL) 20 MG tablet TAKE 1 TABLET WITH EVENING MEAL  . TRADJENTA 5 MG TABS tablet TAKE 1 TABLET DAILY  . vitamin E 400 UNIT capsule Take 400 Units by mouth daily.  . [DISCONTINUED] desoximetasone (TOPICORT) 0.05 % cream Apply topically 2 (two) times daily. (Patient not taking:  Reported on 11/12/2018)   No facility-administered encounter medications on file as of 11/26/2018.     Review of Systems:  Review of Systems  Constitutional: Positive for malaise/fatigue and weight loss. Negative for chills and fever.  HENT: Positive for hearing loss.   Eyes: Negative for blurred vision.       Glasses  Respiratory: Negative for cough and shortness of breath.   Cardiovascular: Negative for chest pain, palpitations and leg swelling.  Gastrointestinal: Positive for constipation and diarrhea. Negative for abdominal pain, blood in stool, melena, nausea and vomiting.  Genitourinary: Negative for dysuria.  Musculoskeletal: Positive for joint pain. Negative for back pain and falls.       Back not bothersome recently, but legs and feet bother her  Skin: Negative for itching and rash.  Neurological: Positive for weakness. Negative for dizziness and loss of consciousness.  Psychiatric/Behavioral: Negative for depression and memory loss. The patient is not nervous/anxious.     Health Maintenance  Topic Date Due  . OPHTHALMOLOGY EXAM  11/15/2018  . FOOT EXAM  11/16/2018  . HEMOGLOBIN A1C  05/23/2019  . TETANUS/TDAP  10/01/2026  . INFLUENZA VACCINE  Completed  . DEXA SCAN  Completed  . PNA vac Low Risk Adult  Completed    Physical Exam: Vitals:   11/26/18 1149  BP: 120/70  Pulse: 89  Temp: 97.8 F (36.6 C)  TempSrc: Oral  SpO2: 97%  Weight: 159 lb (72.1 kg)  Height: 5\' 5"  (1.651 m)   Body mass index is 26.46 kg/m. Physical Exam Constitutional:      Comments: Appears weak and moving slowly today  HENT:     Head: Normocephalic and atraumatic.  Eyes:     Comments: glasses  Cardiovascular:     Pulses: Normal pulses.     Heart sounds: Normal heart sounds. No murmur. No friction rub. No gallop.   Pulmonary:     Effort: Pulmonary effort is normal.     Breath sounds: Normal breath sounds. No rales.  Abdominal:     General: Abdomen is flat. Bowel sounds are  normal. There is no distension.     Palpations: Abdomen is soft. There is no mass.     Tenderness: There is no abdominal tenderness.  Musculoskeletal: Normal range of motion.     Comments: Stooped posture, walking with walker today  Skin:    General: Skin is warm and dry.  Neurological:     General: No focal deficit present.     Mental Status: She is alert and oriented to person, place, and time.     Cranial Nerves: No cranial nerve deficit.     Comments: Neuropathy of feet with diminished sensation  Psychiatric:        Mood and Affect: Mood normal.        Behavior: Behavior  normal.        Thought Content: Thought content normal.        Judgment: Judgment normal.     Labs reviewed: Basic Metabolic Panel: Recent Labs    06/08/18 0909 11/14/18 1920 11/21/18 0917  NA 142 141 139  K 4.2 4.0 4.0  CL 106 108 103  CO2 27 19* 25  GLUCOSE 123* 254* 103*  BUN 29* 24* 48*  CREATININE 1.83* 1.49* 2.62*  CALCIUM 9.4 9.4 9.4   Liver Function Tests: Recent Labs    06/08/18 0909 11/21/18 0917  AST 16 22  ALT 11 20  BILITOT 0.4 0.5  PROT 6.6 6.7   No results for input(s): LIPASE, AMYLASE in the last 8760 hours. No results for input(s): AMMONIA in the last 8760 hours. CBC: Recent Labs    06/08/18 0909 11/14/18 1920 11/21/18 0917  WBC 7.3 12.6* 10.1  NEUTROABS 4,424  --  6,636  HGB 10.8* 12.0 10.8*  HCT 33.8* 37.6 34.4*  MCV 86.9 84.5 86.2  PLT 141 220 199   Lipid Panel: No results for input(s): CHOL, HDL, LDLCALC, TRIG, CHOLHDL, LDLDIRECT in the last 8760 hours. Lab Results  Component Value Date   HGBA1C 6.3 (H) 11/21/2018    Procedures since last visit: Dg Chest 2 View  Result Date: 11/14/2018 CLINICAL DATA:  Hyperglycemia.  Diarrhea after recent colonoscopy. EXAM: CHEST - 2 VIEW COMPARISON:  None. FINDINGS: Cardiomediastinal silhouette is normal. Mildly calcified aortic arch. No pleural effusions or focal consolidations. Trachea projects midline and there is no  pneumothorax. Soft tissue planes and included osseous structures are non-suspicious. Tubing overlies patient. Calcification LEFT neck is likely vascular. IMPRESSION: 1. No acute cardiopulmonary process. 2.  Aortic Atherosclerosis (ICD10-I70.0). Electronically Signed   By: Elon Alas M.D.   On: 11/14/2018 23:48   Ct Abdomen Pelvis W Contrast  Result Date: 11/15/2018 CLINICAL DATA:  84 year old female with diarrhea and abdominal pain. EXAM: CT ABDOMEN AND PELVIS WITH CONTRAST TECHNIQUE: Multidetector CT imaging of the abdomen and pelvis was performed using the standard protocol following bolus administration of intravenous contrast. CONTRAST:  67mL OMNIPAQUE IOHEXOL 300 MG/ML  SOLN COMPARISON:  None. FINDINGS: Lower chest: The visualized lung bases are clear. No intra-abdominal free air. There is trace free fluid the pelvis. Hepatobiliary: The liver is unremarkable. No intrahepatic biliary ductal dilatation. The gallbladder is unremarkable. Pancreas: There is fatty atrophy of the pancreas. No dilatation of the main pancreatic duct. No active inflammatory changes. Spleen: Normal in size without focal abnormality. Adrenals/Urinary Tract: There is an indeterminate 12 mm left adrenal nodule. The right adrenal gland is unremarkable. There is no hydronephrosis on either side. There is symmetric enhancement and excretion of contrast by both kidneys. Small bilateral renal cysts measuring up to 2 cm in the left kidney as well as subcentimeter hypodensities which are too small to characterize. The visualized ureters and urinary bladder appear unremarkable. Stomach/Bowel: There is mild diffuse thickened appearance of the colon predominantly involving the ascending colon likely representing mild colitis. Clinical correlation is recommended. An infiltrative mass involving the ascending colon is less likely but not entirely excluded. Follow-up after resolution of acute symptoms or correlation with colonoscopy is  recommended. There is extensive colonic diverticulosis without active inflammatory changes. No bowel obstruction. The appendix is normal. Vascular/Lymphatic: Mild aortoiliac atherosclerotic disease. No portal venous gas. There is no adenopathy. Reproductive: The uterus and ovaries are grossly unremarkable. No pelvic mass. Other: None Musculoskeletal: Osteopenia with degenerative changes of the spine. No acute  osseous pathology. IMPRESSION: 1. Findings likely represent mild colitis. Clinical correlation is recommended. No bowel obstruction. Normal appendix. 2. Colonic diverticulosis without active inflammatory changes. No bowel obstruction. Normal appendix. Electronically Signed   By: Anner Crete M.D.   On: 11/15/2018 00:16    Assessment/Plan 1. Acute renal failure superimposed on stage 4 chronic kidney disease, unspecified acute renal failure type ([place]) -was significant in midst of having had diarrhea and then bowel prep for cscope -f/u labs to ensure improvement to baseline b/c she appears very weak and fatigued today and admits to feeling this way -she did not receive the message to hold her ace/hctz after I saw the lab results - Basic metabolic panel - COMPLETE METABOLIC PANEL WITH GFR; Future  2. Mixed hyperlipidemia - cont her pravachol--if she was not already so frail and at risk of myalgias, I would change her regimen, but she's tolerating the pravachol and if LDL at goal, would not rock the boat - Lipid panel  3. DM type 2 with diabetic peripheral neuropathy (HCC) -cont glipizide, tradjenta, ace/hctz now - Hemoglobin A1c; Future NOTE PATIENT HAS EYE EXAM NEXT WEEK BEFORE END OF YEAR--WILL NEED COPY  4. Colitis -pending biopsy results - CBC with Differential/Platelet; Future - COMPLETE METABOLIC PANEL WITH GFR; Future  5. Need for shingles vaccine - Zoster Vaccine Adjuvanted [facility]) injection; Inject 0.5 mLs into the muscle once for 1 dose.  Dispense: 0.5 mL; Refill:  1  Labs/tests ordered:  Orders Placed This Encounter  Procedures  . Basic metabolic panel    Order Specific Question:   Has the patient fasted?    Answer:   Yes  . Lipid panel    Order Specific Question:   Has the patient fasted?    Answer:   Yes  . CBC with Differential/Platelet    Standing Status:   Future    Standing Expiration Date:   11/27/2019  . COMPLETE METABOLIC PANEL WITH GFR    Standing Status:   Future    Standing Expiration Date:   11/27/2019  . Hemoglobin A1c    Standing Status:   Future    Standing Expiration Date:   11/27/2019   Next appt:  04/04/2019  Lovie Zarling L. Bunny Kleist, D.O. [facility] [address], [place] [address] Cell Phone (Mon-Fri 8am-5pm):  [phone] On Call:  [phone] & follow prompts after 5pm & weekends Office Phone:  [phone] Office Fax:  [phone]

## 2018-11-27 LAB — BASIC METABOLIC PANEL
BUN/Creatinine Ratio: 20 (calc) (ref 6–22)
BUN: 32 mg/dL — ABNORMAL HIGH (ref 7–25)
CO2: 26 mmol/L (ref 20–32)
Calcium: 9.8 mg/dL (ref 8.6–10.4)
Chloride: 105 mmol/L (ref 98–110)
Creat: 1.63 mg/dL — ABNORMAL HIGH (ref 0.60–0.88)
Glucose, Bld: 53 mg/dL — ABNORMAL LOW (ref 65–139)
Potassium: 4.3 mmol/L (ref 3.5–5.3)
Sodium: 142 mmol/L (ref 135–146)

## 2018-11-27 LAB — LIPID PANEL
Cholesterol: 147 mg/dL (ref <200)
HDL: 39 mg/dL — ABNORMAL LOW (ref >50)
LDL Cholesterol (Calc): 81 mg/dL (calc)
Non-HDL Cholesterol (Calc): 108 mg/dL (calc) (ref <130)
Total CHOL/HDL Ratio: 3.8 (calc) (ref <5.0)
Triglycerides: 162 mg/dL — ABNORMAL HIGH (ref <150)

## 2018-12-03 LAB — HM DIABETES EYE EXAM

## 2018-12-10 ENCOUNTER — Telehealth: Payer: Self-pay

## 2018-12-10 NOTE — Telephone Encounter (Signed)
Patient called and stated she had not bowel movement since Friday. She would like to know what she needed to take. Please advise.

## 2018-12-11 NOTE — Telephone Encounter (Signed)
Has she taken anything at all for her bowels in the past 2-3 days?  She had previously been struggling with diarrhea, but it now seems like she is back to constipation.  She may need to take 2 senokot tablets regularly each day b/c of her oxycodone and she may take a dose of miralax now.

## 2018-12-11 NOTE — Telephone Encounter (Signed)
Spoke with patient, patient has not tried anything for symptoms. Patient verbalized understanding of Dr.Reed's response and will call if recommendations are ineffective   Senna added to patient's medication list.  Message routed back to Dr.Reed as a FYI

## 2018-12-18 ENCOUNTER — Ambulatory Visit (INDEPENDENT_AMBULATORY_CARE_PROVIDER_SITE_OTHER): Payer: Medicare Other | Admitting: Gastroenterology

## 2018-12-18 ENCOUNTER — Encounter: Payer: Self-pay | Admitting: Gastroenterology

## 2018-12-18 VITALS — BP 120/68 | HR 76 | Ht 65.0 in | Wt 162.0 lb

## 2018-12-18 DIAGNOSIS — K5909 Other constipation: Secondary | ICD-10-CM

## 2018-12-18 NOTE — Progress Notes (Signed)
[facility] Progress Note  Chief Complaint: Chronic constipation  Subjective  History:  Dawn [name] was seeing me for chronic diarrhea over the last several months.  Curiously, it has completely resolved she has trouble with constipation.  She takes an opiate once daily for chronic arthritic pain, but has done so for some time.  She does this if the stool is small and hard and difficult to pass.  She would previously take Senokot regularly, but had not yet gone back to doing so recently.  She took a capful of MiraLAX daily for 2 days, it seemed to get things moving, so she stopped again and things are back to small hard stools. Her appetite is good, she still says that she watches what she eats to try to control her weight.  Fortunately, her weight has stabilized, and I told her she seems to be at a healthy weight as far as I am concerned, and I do not want her restricting her calories anymore. She denies vomiting or dysphagia.  ROS: Cardiovascular:  no chest pain Respiratory: no dyspnea Arthritic pain in the knees  The patient's Past Medical, Family and Social History were reviewed and are on file in the EMR.  Objective:  Med list reviewed  Current Outpatient Medications:  .  aspirin 81 MG tablet, Take 81 mg by mouth daily., Disp: , Rfl:  .  calcium-vitamin D (OSCAL WITH D) 500-200 MG-UNIT per tablet, Take 1 tablet by mouth daily., Disp: , Rfl:  .  cholecalciferol (VITAMIN D) 1000 UNITS tablet, Take 1,000 Units by mouth daily., Disp: , Rfl:  .  gabapentin (NEURONTIN) 300 MG capsule, Take one tablet by mouth in the morning and one tablet by mouth in the evening for pains, Disp: 180 capsule, Rfl: 3 .  glipiZIDE (GLUCOTROL) 5 MG tablet, TAKE 1 TABLET DAILY, Disp: 90 tablet, Rfl: 1 .  glucose blood (FREESTYLE LITE) test strip, Use to test blood sugar once daily. Dx: E11.21, Disp: 300 each, Rfl: 3 .  Lancets (FREESTYLE) lancets, Use to test blood sugar. Dx: E11.21, Disp: 300 each, Rfl:  3 .  lisinopril-hydrochlorothiazide (PRINZIDE,ZESTORETIC) 20-12.5 MG tablet, Take one tablet by mouth once daily to control blood pressure, Disp: 90 tablet, Rfl: 3 .  oxyCODONE-acetaminophen (ROXICET) 5-325 MG tablet, Take 1 tablet by mouth every 8 (eight) hours as needed for severe pain., Disp: 90 tablet, Rfl: 0 .  pravastatin (PRAVACHOL) 20 MG tablet, TAKE 1 TABLET WITH EVENING MEAL, Disp: 90 tablet, Rfl: 1 .  senna (SENOKOT) 8.6 MG TABS tablet, Take 2 tablets (17.2 mg total) by mouth daily., Disp: , Rfl:  .  TRADJENTA 5 MG TABS tablet, TAKE 1 TABLET DAILY, Disp: 90 tablet, Rfl: 1 .  vitamin E 400 UNIT capsule, Take 400 Units by mouth daily., Disp: , Rfl:    Vital signs in last 24 hrs: Vitals:   12/18/18 1554  BP: 120/68  Pulse: 76    Physical Exam  Well-appearing.  Gets on exam table slowly but without assistance  HEENT: sclera anicteric, oral mucosa moist without lesions  Neck: supple, no thyromegaly, JVD or lymphadenopathy  Cardiac: RRR without murmurs, S1S2 heard, no peripheral edema  Pulm: clear to auscultation bilaterally, normal RR and effort noted  Abdomen: soft, no tenderness, with active bowel sounds. No guarding or palpable hepatosplenomegaly.  Recent Labs:  Colon biopsies negative for microscopic colitis, however it was notable for mild melanosis coli. Duodenal biopsies normal.    @ASSESSMENTPLANBEGIN @ Assessment: Encounter Diagnosis  Name  Primary?  . Chronic constipation Yes   Cause of her previous diarrhea is unknown, and has now resolved.  It sounds like she has been prone to different bowel habit changes over the years.  She had previously taken Senokot which probably accounts for the mild melanosis seen on colon biopsies.  Reassured her that MiraLAX is safe to take as needed.  I recommend a capful a day for the next 2 days, then decrease to a half capful a day or maybe even quarter capful if stool is too soft or loose. And there after dose as  needed  See me as needed.   Total time 15 minutes, over half spent face-to-face with patient in counseling and coordination of care.   Nelida Meuse III

## 2018-12-18 NOTE — Patient Instructions (Signed)
If you are age 83 or older, your body mass index should be between 23-30. Your Body mass index is 26.96 kg/m. If this is out of the aforementioned range listed, please consider follow up with your Primary Care Provider.  If you are age 65 or younger, your body mass index should be between 19-25. Your Body mass index is 26.96 kg/m. If this is out of the aformentioned range listed, please consider follow up with your Primary Care Provider.   It was a pleasure to see you today!  Dr. Loletha Carrow

## 2018-12-21 ENCOUNTER — Other Ambulatory Visit: Payer: Self-pay | Admitting: *Deleted

## 2018-12-21 DIAGNOSIS — M48062 Spinal stenosis, lumbar region with neurogenic claudication: Secondary | ICD-10-CM

## 2018-12-21 MED ORDER — OXYCODONE-ACETAMINOPHEN 5-325 MG PO TABS: 1.0000 | ORAL_TABLET | Freq: Three times a day (TID) | ORAL | 0 refills | Status: DC | PRN

## 2018-12-21 NOTE — Telephone Encounter (Signed)
Patient called to check the status of her request. Patient aware request sent to provider and we are awaiting response

## 2018-12-21 NOTE — Telephone Encounter (Signed)
I'm sorry for the delay. I've now sent the Rx.  Please let Dawn [name] know that I'm out of the office on Fridays so it may take a few hours for me to address my inbasket during that time, but her concerns will be addressed by end of day (for future reference).

## 2018-12-21 NOTE — Telephone Encounter (Signed)
Patient requested refill NCCSRS Database Verified LR: 10/30/2018 Pended Rx and sent to Dr. Mariea Clonts for approval.

## 2018-12-21 NOTE — Telephone Encounter (Signed)
Patient aware rx sent to pharmacy and verbalized understanding of Dr.Reed's response. Patient plans to call on Thursday for future request to avoid delays. Message handled to patient's apparent satisfaction

## 2018-12-24 ENCOUNTER — Encounter: Payer: Self-pay | Admitting: *Deleted

## 2019-01-21 ENCOUNTER — Other Ambulatory Visit: Payer: Self-pay | Admitting: *Deleted

## 2019-01-21 MED ORDER — LISINOPRIL-HYDROCHLOROTHIAZIDE 20-12.5 MG PO TABS: ORAL_TABLET | ORAL | 3 refills | Status: DC

## 2019-01-21 NOTE — Telephone Encounter (Signed)
[facility]

## 2019-02-05 ENCOUNTER — Ambulatory Visit (INDEPENDENT_AMBULATORY_CARE_PROVIDER_SITE_OTHER): Payer: Medicare Other | Admitting: Podiatry

## 2019-02-05 DIAGNOSIS — B351 Tinea unguium: Secondary | ICD-10-CM

## 2019-02-05 DIAGNOSIS — E1142 Type 2 diabetes mellitus with diabetic polyneuropathy: Secondary | ICD-10-CM | POA: Diagnosis not present

## 2019-02-05 DIAGNOSIS — L84 Corns and callosities: Secondary | ICD-10-CM

## 2019-02-05 DIAGNOSIS — M79609 Pain in unspecified limb: Secondary | ICD-10-CM | POA: Diagnosis not present

## 2019-02-05 NOTE — Patient Instructions (Signed)

## 2019-02-06 ENCOUNTER — Other Ambulatory Visit: Payer: Self-pay | Admitting: *Deleted

## 2019-02-06 DIAGNOSIS — M48062 Spinal stenosis, lumbar region with neurogenic claudication: Secondary | ICD-10-CM

## 2019-02-06 MED ORDER — OXYCODONE-ACETAMINOPHEN 5-325 MG PO TABS: 1.0000 | ORAL_TABLET | Freq: Three times a day (TID) | ORAL | 0 refills | Status: DC | PRN

## 2019-02-06 NOTE — Telephone Encounter (Signed)
Patient requested refill NCCSRS Database Verified LR: 12/21/2018 Pended Rx and sent to Dr. Mariea Clonts for approval.

## 2019-02-09 ENCOUNTER — Encounter: Payer: Self-pay | Admitting: Podiatry

## 2019-02-09 NOTE — Progress Notes (Signed)
Subjective: Dawn [name] presents with h/o  diabetic neuropathy and painful mycotic toenails and painful corn right 3rd dgit which both interfere with activities of daily living. Pain is aggravated when wearing enclosed shoe gear. Pain is relieved with periodic professional debridement.    She voices no new pedal concerns on today's visit.  Last A1c was 6.3.  Gayland Curry, DO is her PCP and last visit was 11/26/2018.   Current Outpatient Medications:  .  aspirin 81 MG tablet, Take 81 mg by mouth daily., Disp: , Rfl:  .  calcium-vitamin D (OSCAL WITH D) 500-200 MG-UNIT per tablet, Take 1 tablet by mouth daily., Disp: , Rfl:  .  cholecalciferol (VITAMIN D) 1000 UNITS tablet, Take 1,000 Units by mouth daily., Disp: , Rfl:  .  gabapentin (NEURONTIN) 300 MG capsule, Take one tablet by mouth in the morning and one tablet by mouth in the evening for pains, Disp: 180 capsule, Rfl: 3 .  glipiZIDE (GLUCOTROL) 5 MG tablet, TAKE 1 TABLET DAILY, Disp: 90 tablet, Rfl: 1 .  glucose blood (FREESTYLE LITE) test strip, Use to test blood sugar once daily. Dx: E11.21, Disp: 300 each, Rfl: 3 .  Lancets (FREESTYLE) lancets, Use to test blood sugar. Dx: E11.21, Disp: 300 each, Rfl: 3 .  lisinopril-hydrochlorothiazide (PRINZIDE,ZESTORETIC) 20-12.5 MG tablet, Take one tablet by mouth once daily to control blood pressure, Disp: 90 tablet, Rfl: 3 .  oxyCODONE-acetaminophen (ROXICET) 5-325 MG tablet, Take 1 tablet by mouth every 8 (eight) hours as needed for severe pain., Disp: 90 tablet, Rfl: 0 .  pravastatin (PRAVACHOL) 20 MG tablet, TAKE 1 TABLET WITH EVENING MEAL, Disp: 90 tablet, Rfl: 1 .  senna (SENOKOT) 8.6 MG TABS tablet, Take 2 tablets (17.2 mg total) by mouth daily., Disp: , Rfl:  .  TRADJENTA 5 MG TABS tablet, TAKE 1 TABLET DAILY, Disp: 90 tablet, Rfl: 1 .  vitamin E 400 UNIT capsule, Take 400 Units by mouth daily., Disp: , Rfl:   Allergies  Allergen Reactions  . Keflex [Cephalexin]   . Metronidazole    . Simvastatin     Leg cramsps    Vascular Examination: Capillary refill time <3 seconds x 10 digits.  Dorsalis pedis and Posterior tibial pulses 1/4 b/l  + digital hair x 10 digits  Skin temperature WNL b/l  Dermatological Examination: Skin with normal turgor, texture and tone b/l  Toenails 1-5 b/l discolored, thick, dystrophic with subungual debris and pain with palpation to nailbeds due to thickness of nails.  Hyperkeratotic lesion dorsal PIPJ right 3rd digit with tenderness to palpation. No erythema, no edema, no drainage, no flocculence.  Musculoskeletal: Muscle strength 5/5 to all LE muscle groups  HAV with bunion b/l  Hammertoes 2-5 b/l  Pes planus foot type b/l  Neurological: Sensation diminished with 10 gram monofilament.  Vibratory sensation diminished  Assessment: 1. Painful onychomycosis toenails 1-5 b/l 2. Corn right 3rd digit b/l 3. NIDDM with Diabetic neuropathy  Plan: 1. Continue diabetic foot care principles.  2. Toenails 1-5 b/l were debrided in length and girth without iatrogenic bleeding. 3. Hyperkeratotic lesion pared right 3rd digit with sterile scalpel blade without incident 4.  Patient to continue soft, supportive shoe gear 5. Patient to report any pedal injuries to medical professional  6. Follow up 3 months.  7. Patient/POA to call should there be a concern in the interim.

## 2019-03-06 ENCOUNTER — Other Ambulatory Visit: Payer: Self-pay | Admitting: *Deleted

## 2019-03-06 ENCOUNTER — Other Ambulatory Visit: Payer: Self-pay | Admitting: Internal Medicine

## 2019-03-06 DIAGNOSIS — E1121 Type 2 diabetes mellitus with diabetic nephropathy: Secondary | ICD-10-CM

## 2019-03-06 MED ORDER — GLIPIZIDE 5 MG PO TABS: 5.0000 mg | ORAL_TABLET | Freq: Every day | ORAL | 1 refills | Status: DC

## 2019-03-06 MED ORDER — PRAVASTATIN SODIUM 20 MG PO TABS: ORAL_TABLET | ORAL | 1 refills | Status: DC

## 2019-03-06 MED ORDER — LINAGLIPTIN 5 MG PO TABS: 5.0000 mg | ORAL_TABLET | Freq: Every day | ORAL | 1 refills | Status: DC

## 2019-03-06 NOTE — Telephone Encounter (Signed)
Patient called and requested mail order Rx's.  Pended Pravastatin due to HIGH ALERT Warning and sent to Dr. Mariea Clonts for approval.

## 2019-03-20 ENCOUNTER — Other Ambulatory Visit: Payer: Self-pay | Admitting: *Deleted

## 2019-03-20 DIAGNOSIS — M48062 Spinal stenosis, lumbar region with neurogenic claudication: Secondary | ICD-10-CM

## 2019-03-20 MED ORDER — OXYCODONE-ACETAMINOPHEN 5-325 MG PO TABS: 1.0000 | ORAL_TABLET | Freq: Three times a day (TID) | ORAL | 0 refills | Status: DC | PRN

## 2019-03-20 NOTE — Telephone Encounter (Signed)
Patient requested refill NCCSRS Database Verified LR: 02/06/2019 Pended Rx and sent to Dr. Mariea Clonts for approval.

## 2019-03-29 ENCOUNTER — Other Ambulatory Visit: Payer: Medicare Other

## 2019-03-29 ENCOUNTER — Other Ambulatory Visit: Payer: Self-pay

## 2019-03-29 DIAGNOSIS — N184 Chronic kidney disease, stage 4 (severe): Secondary | ICD-10-CM

## 2019-03-29 DIAGNOSIS — N179 Acute kidney failure, unspecified: Secondary | ICD-10-CM

## 2019-03-29 DIAGNOSIS — E1142 Type 2 diabetes mellitus with diabetic polyneuropathy: Secondary | ICD-10-CM

## 2019-03-29 DIAGNOSIS — K529 Noninfective gastroenteritis and colitis, unspecified: Secondary | ICD-10-CM

## 2019-03-30 LAB — CBC WITH DIFFERENTIAL/PLATELET
Absolute Monocytes: 529 cells/uL (ref 200–950)
Basophils Absolute: 71 cells/uL (ref 0–200)
Basophils Relative: 0.9 %
Eosinophils Absolute: 403 cells/uL (ref 15–500)
Eosinophils Relative: 5.1 %
HCT: 34.7 % — ABNORMAL LOW (ref 35.0–45.0)
Hemoglobin: 11.2 g/dL — ABNORMAL LOW (ref 11.7–15.5)
Lymphs Abs: 2133 cells/uL (ref 850–3900)
MCH: 28.1 pg (ref 27.0–33.0)
MCHC: 32.3 g/dL (ref 32.0–36.0)
MCV: 87.2 fL (ref 80.0–100.0)
MPV: 11.8 fL (ref 7.5–12.5)
Monocytes Relative: 6.7 %
Neutro Abs: 4764 cells/uL (ref 1500–7800)
Neutrophils Relative %: 60.3 %
Platelets: 162 10*3/uL (ref 140–400)
RBC: 3.98 10*6/uL (ref 3.80–5.10)
RDW: 13.5 % (ref 11.0–15.0)
Total Lymphocyte: 27 %
WBC: 7.9 10*3/uL (ref 3.8–10.8)

## 2019-03-30 LAB — COMPLETE METABOLIC PANEL WITH GFR
AG Ratio: 1.2 (calc) (ref 1.0–2.5)
ALT: 12 U/L (ref 6–29)
AST: 18 U/L (ref 10–35)
Albumin: 3.8 g/dL (ref 3.6–5.1)
Alkaline phosphatase (APISO): 78 U/L (ref 37–153)
BUN/Creatinine Ratio: 17 (calc) (ref 6–22)
BUN: 28 mg/dL — ABNORMAL HIGH (ref 7–25)
CO2: 26 mmol/L (ref 20–32)
Calcium: 9.8 mg/dL (ref 8.6–10.4)
Chloride: 106 mmol/L (ref 98–110)
Creat: 1.66 mg/dL — ABNORMAL HIGH (ref 0.60–0.88)
GFR, Est African American: 31 mL/min/{1.73_m2} — ABNORMAL LOW (ref >=60)
GFR, Est Non African American: 27 mL/min/{1.73_m2} — ABNORMAL LOW (ref >=60)
Globulin: 3.2 g/dL (calc) (ref 1.9–3.7)
Glucose, Bld: 114 mg/dL — ABNORMAL HIGH (ref 65–99)
Potassium: 4.1 mmol/L (ref 3.5–5.3)
Sodium: 142 mmol/L (ref 135–146)
Total Bilirubin: 0.5 mg/dL (ref 0.2–1.2)
Total Protein: 7 g/dL (ref 6.1–8.1)

## 2019-03-30 LAB — HEMOGLOBIN A1C
Hgb A1c MFr Bld: 6.4 % of total Hgb — ABNORMAL HIGH (ref <5.7)
Mean Plasma Glucose: 137 (calc)
eAG (mmol/L): 7.6 (calc)

## 2019-04-01 ENCOUNTER — Encounter: Payer: Self-pay | Admitting: *Deleted

## 2019-04-04 ENCOUNTER — Other Ambulatory Visit: Payer: Self-pay

## 2019-04-04 ENCOUNTER — Ambulatory Visit (INDEPENDENT_AMBULATORY_CARE_PROVIDER_SITE_OTHER): Payer: Medicare Other | Admitting: Internal Medicine

## 2019-04-04 ENCOUNTER — Encounter: Payer: Self-pay | Admitting: Internal Medicine

## 2019-04-04 DIAGNOSIS — E782 Mixed hyperlipidemia: Secondary | ICD-10-CM

## 2019-04-04 DIAGNOSIS — M159 Polyosteoarthritis, unspecified: Secondary | ICD-10-CM | POA: Diagnosis not present

## 2019-04-04 DIAGNOSIS — Z7189 Other specified counseling: Secondary | ICD-10-CM

## 2019-04-04 DIAGNOSIS — E1122 Type 2 diabetes mellitus with diabetic chronic kidney disease: Secondary | ICD-10-CM

## 2019-04-04 DIAGNOSIS — N184 Chronic kidney disease, stage 4 (severe): Secondary | ICD-10-CM | POA: Diagnosis not present

## 2019-04-04 DIAGNOSIS — M48062 Spinal stenosis, lumbar region with neurogenic claudication: Secondary | ICD-10-CM

## 2019-04-04 DIAGNOSIS — E1142 Type 2 diabetes mellitus with diabetic polyneuropathy: Secondary | ICD-10-CM

## 2019-04-04 NOTE — ACP (Advance Care Planning) (Signed)
She knows she would not want to be on a machine for a long period of time.  She may want to be on it temporarily if there were thoughts she'd get better.  She would not want her family to suffer with her on a machine a long time.  She has some type of power of attorney for her daughter.  They were meant to go to the lawyer to update it but haven't gotten to with all of this stuff going on.  [name] is her lawyer.    -again discussed importance of advance care planning this time in context of covid infection -she understands she is "medium risk" though I would think she'd be higher risk -she is not sure what she would want to do if she got sick with the virus as far as being put on a ventilator if it was needed--she does know she would not want to be kept on one a long time -she has a general POA but is not sure if there is a health care component--encouraged her to check into this and did review concerns about quality of life and prognosis with ventilators in older adults with covid--she is not sure.  17 minute on ACP.

## 2019-04-04 NOTE — Progress Notes (Signed)
Patient ID: Dawn [name], female   DOB: 1928-01-14, 85 y.o.   MRN: [id number] This service is provided via telemedicine  No vital signs collected/recorded due to the encounter was a telemedicine visit.   Location of patient (ex: home, work):  HOME  Patient consents to a telephone visit:  YES  Location of the provider (ex: office, home):  OFFICE  Name of any referring provider:  DR [facility] Lawonda Pretlow DO  Names of all persons participating in the telemedicine service and their role in the encounter:  PATIENT, Dawn [name], Dawn [name], DR Hollace Kinnier, DO  Time spent on call:  3:24    Provider:  Katianna Mcclenney L. Mariea Clonts, D.O., C.M.D.  Goals of Care:  Advanced Directives 11/12/2018  Does Patient Have a Medical Advance Directive? -  Type of Advance Directive [place]  Does patient want to make changes to medical advance directive? -  Copy of [place] in Chart? -  Would patient like information on creating a medical advance directive? -     Chief Complaint  Patient presents with  . telephone visit    30mth follow-up    HPI: Patient is a 85 y.o. female seen today for medical management of chronic diseases.    She is pretty much staying in.  Reviewed preventive measures.  Still hurts from time to time in her legs mostly, sometimes in her back.    She saw Dr. Loletha Carrow about her constipation--did not change her regimen.  She is no longer having difficulty with her bowels moving.    CBGs 119 this morning.  Stays in lower 100s to 118.  Lowest was 88.  No others like that.  She's trying to eat well.  She does try to eat three times a day with two snacks.  Avoids overeating.    Had her podiatry appt on 2/25 and nails trimmed.  Dec had her ophtho visit with Dr. Katy Fitch.    She was used to getting kidney tests regularly each year when she saw the nephrologist.  No longer goes there.    She knows she would not want to be on a machine for a long period of time.  She may  want to be on it temporarily if there were thoughts she'd get better.  She would not want her family to suffer with her on a machine a long time.  She has some type of power of attorney for her daughter.  They were meant to go to the lawyer to update it but haven't gotten to with all of this stuff going on.  [name] is her lawyer.    Past Medical History:  Diagnosis Date  . Anemia, unspecified   . Anorexia   . Chronic kidney disease   . Chronic kidney disease, stage III (moderate) (HCC)   . Cystocele, midline   . Disorder of bone and cartilage, unspecified   . Kyphosis associated with other condition   . Obesity, unspecified   . Osteoarthrosis, unspecified whether generalized or localized, unspecified site   . Other and unspecified hyperlipidemia   . Other hammer toe (acquired)   . Postmenopausal atrophic vaginitis   . PVC's (premature ventricular contractions) 01/29/2015  . RBBB 01/29/2015  . Reflux esophagitis   . Type II or unspecified type diabetes mellitus without mention of complication, not stated as uncontrolled   . Unspecified constipation   . Unspecified disorder of kidney and ureter   . Unspecified essential hypertension   . Unspecified hereditary  and idiopathic peripheral neuropathy   . Unspecified urinary incontinence     Past Surgical History:  Procedure Laterality Date  . BLADDER SURGERY     tie up in 2013  . EYE SURGERY  11/2016   Bilateral Cataract Sx  . TUBAL LIGATION      Allergies  Allergen Reactions  . Keflex [Cephalexin]   . Metronidazole   . Simvastatin     Leg cramsps    Outpatient Encounter Medications as of 04/04/2019  Medication Sig  . aspirin 81 MG tablet Take 81 mg by mouth daily.  . calcium-vitamin D (OSCAL WITH D) 500-200 MG-UNIT per tablet Take 1 tablet by mouth daily.  . cholecalciferol (VITAMIN D) 1000 UNITS tablet Take 1,000 Units by mouth daily.  Marland Kitchen gabapentin (NEURONTIN) 300 MG capsule Take one tablet by mouth in the morning and  one tablet by mouth in the evening for pains  . glipiZIDE (GLUCOTROL) 5 MG tablet Take 1 tablet (5 mg total) by mouth daily.  Marland Kitchen glucose blood (FREESTYLE LITE) test strip Use to test blood sugar once daily. Dx: E11.21  . Lancets (FREESTYLE) lancets Use to test blood sugar. Dx: E11.21  . linagliptin (TRADJENTA) 5 MG TABS tablet Take 1 tablet (5 mg total) by mouth daily.  Marland Kitchen lisinopril-hydrochlorothiazide (PRINZIDE,ZESTORETIC) 20-12.5 MG tablet Take one tablet by mouth once daily to control blood pressure  . oxyCODONE-acetaminophen (ROXICET) 5-325 MG tablet Take 1 tablet by mouth every 8 (eight) hours as needed for severe pain.  . pravastatin (PRAVACHOL) 20 MG tablet Take one tablet with evening meal.  . vitamin E 400 UNIT capsule Take 400 Units by mouth daily.  . [DISCONTINUED] senna (SENOKOT) 8.6 MG TABS tablet Take 2 tablets (17.2 mg total) by mouth daily.   No facility-administered encounter medications on file as of 04/04/2019.     Review of Systems:  Review of Systems  Constitutional: Positive for malaise/fatigue. Negative for chills and fever.  HENT: Negative for congestion.   Eyes: Negative for blurred vision.  Respiratory: Negative for cough and shortness of breath.   Cardiovascular: Negative for chest pain, palpitations and leg swelling.  Gastrointestinal: Negative for abdominal pain, blood in stool, constipation, diarrhea and melena.  Genitourinary: Negative for dysuria and frequency.  Musculoskeletal: Positive for back pain and joint pain. Negative for falls.  Skin: Negative for itching and rash.  Neurological: Positive for tingling and sensory change. Negative for dizziness and loss of consciousness.  Endo/Heme/Allergies: Bruises/bleeds easily.  Psychiatric/Behavioral: Negative for depression and memory loss. The patient is not nervous/anxious and does not have insomnia.     Health Maintenance  Topic Date Due  . INFLUENZA VACCINE  07/13/2019  . HEMOGLOBIN A1C  09/28/2019  .  FOOT EXAM  11/27/2019  . OPHTHALMOLOGY EXAM  12/04/2019  . TETANUS/TDAP  10/01/2026  . DEXA SCAN  Completed  . PNA vac Low Risk Adult  Completed    Physical Exam: Could not be performed as visit non face-to-face via phone   Labs reviewed: Basic Metabolic Panel: Recent Labs    11/21/18 0917 11/26/18 1227 03/29/19 0843  NA 139 142 142  K 4.0 4.3 4.1  CL 103 105 106  CO2 25 26 26   GLUCOSE 103* 53* 114*  BUN 48* 32* 28*  CREATININE 2.62* 1.63* 1.66*  CALCIUM 9.4 9.8 9.8   Liver Function Tests: Recent Labs    06/08/18 0909 11/21/18 0917 03/29/19 0843  AST 16 22 18   ALT 11 20 12   BILITOT 0.4 0.5 0.5  PROT 6.6 6.7 7.0   No results for input(s): LIPASE, AMYLASE in the last 8760 hours. No results for input(s): AMMONIA in the last 8760 hours. CBC: Recent Labs    06/08/18 0909 11/14/18 1920 11/21/18 0917 03/29/19 0843  WBC 7.3 12.6* 10.1 7.9  NEUTROABS 4,424  --  6,636 4,764  HGB 10.8* 12.0 10.8* 11.2*  HCT 33.8* 37.6 34.4* 34.7*  MCV 86.9 84.5 86.2 87.2  PLT 141 220 199 162   Lipid Panel: Recent Labs    11/26/18 1227  CHOL 147  HDL 39*  LDLCALC 81  TRIG 162*  CHOLHDL 3.8   Lab Results  Component Value Date   HGBA1C 6.4 (H) 03/29/2019    Assessment/Plan 1. Type 2 diabetes mellitus with stage 4 chronic kidney disease, without long-term current use of insulin (HCC) -control is fairly stable for several years -she does well with frequent small meals and taking her medications, checks cbgs at home -only low was 88 which is ok  2. Mixed hyperlipidemia -remains on pravastatin for cholesterol; may consider a change at her next visit if LDL not at goal especially due to increased benefit with lipitor or crestor -did not tolerate zocor before so this is why I did not change her medication before  3. DM type 2 with diabetic peripheral neuropathy (HCC) -well controlled, but does have some neuropathic pain in legs, on gabapentin but dosing limited due to her  renal function  4. Generalized OA -cont percocet for severe pain and stay as active as pain allows  5. Spinal stenosis, lumbar region, with neurogenic claudication -cont gabapentin and percocet  6. Advance care planning -again discussed importance of advance care planning this time in context of covid infection -she understands she is "medium risk" though I would think she'd be higher risk -she is not sure what she would want to do if she got sick with the virus as far as being put on a ventilator if it was needed--she does know she would not want to be kept on one a long time -she has a general POA but is not sure if there is a health care component--encouraged her to check into this and did review concerns about quality of life and prognosis with ventilators in older adults with covid--she is not sure.  Labs/tests ordered:   Orders Placed This Encounter  Procedures  . CBC with Differential/Platelet    Standing Status:   Future    Standing Expiration Date:   04/03/2020  . COMPLETE METABOLIC PANEL WITH GFR    Standing Status:   Future    Standing Expiration Date:   04/03/2020  . Hemoglobin A1c    Standing Status:   Future    Standing Expiration Date:   04/03/2020  . Lipid panel    Standing Status:   Future    Standing Expiration Date:   04/03/2020    Next appt:  08/08/2019 with fasting labs before  Non face-to-face time spent on televisit:  25 minutes  Atiyah Bauer L. Amos Micheals, D.O. [facility] [address], [place] [address] Cell Phone (Mon-Fri 8am-5pm):  [phone] On Call:  [phone] & follow prompts after 5pm & weekends Office Phone:  [phone] Office Fax:  [phone]

## 2019-04-07 ENCOUNTER — Encounter: Payer: Self-pay | Admitting: Internal Medicine

## 2019-05-03 ENCOUNTER — Encounter: Payer: Medicare Other | Admitting: Family

## 2019-05-03 ENCOUNTER — Encounter: Payer: Self-pay | Admitting: Family

## 2019-05-03 ENCOUNTER — Other Ambulatory Visit: Payer: Self-pay

## 2019-05-06 NOTE — Progress Notes (Signed)
This encounter was created in error - please disregard.

## 2019-05-07 ENCOUNTER — Other Ambulatory Visit: Payer: Self-pay | Admitting: *Deleted

## 2019-05-07 ENCOUNTER — Ambulatory Visit: Payer: Medicare Other | Admitting: Podiatry

## 2019-05-07 DIAGNOSIS — M48062 Spinal stenosis, lumbar region with neurogenic claudication: Secondary | ICD-10-CM

## 2019-05-07 MED ORDER — OXYCODONE-ACETAMINOPHEN 5-325 MG PO TABS: 1.0000 | ORAL_TABLET | Freq: Three times a day (TID) | ORAL | 0 refills | Status: DC | PRN

## 2019-05-07 NOTE — Telephone Encounter (Signed)
Patient requested refill [place] Database Verified LR: 03/20/2019 Pended Rx and sent to Dr. Mariea Clonts for approval.

## 2019-05-08 ENCOUNTER — Ambulatory Visit (INDEPENDENT_AMBULATORY_CARE_PROVIDER_SITE_OTHER): Payer: Medicare Other | Admitting: Family

## 2019-05-08 ENCOUNTER — Encounter: Payer: Self-pay | Admitting: Family

## 2019-05-08 ENCOUNTER — Other Ambulatory Visit: Payer: Self-pay

## 2019-05-08 DIAGNOSIS — Z0000 Encounter for general adult medical examination without abnormal findings: Secondary | ICD-10-CM | POA: Diagnosis not present

## 2019-05-08 NOTE — Patient Instructions (Signed)
Dawn [name] , Thank you for taking time to come for your Medicare Wellness Visit. I appreciate your ongoing commitment to your health goals. Please review the following plan we discussed and let me know if I can assist you in the future.   Screening recommendations/referrals: Colonoscopy: N/A  Mammogram: N/a  Bone Density Up to date  Recommended yearly ophthalmology/optometry visit for glaucoma screening and checkup Recommended yearly dental visit for hygiene and checkup  Vaccinations: Influenza vaccine: Up to date  Pneumococcal vaccine: Up to date  Tdap vaccine:Up to date  Shingles vaccine : Up to date     Advanced directives: No   Conditions/risks identified: Advance age female > 83 yrs,type 2 DM,Hypelidemia,Hypertension,Hx of smoking   Next appointment: 1 year    Preventive Care 83 Years and Older, Female Preventive care refers to lifestyle choices and visits with your health care provider that can promote health and wellness. What does preventive care include?  A yearly physical exam. This is also called an annual well check.  Dental exams once or twice a year.  Routine eye exams. Ask your health care provider how often you should have your eyes checked.  Personal lifestyle choices, including:  Daily care of your teeth and gums.  Regular physical activity.  Eating a healthy diet.  Avoiding tobacco and drug use.  Limiting alcohol use.  Practicing safe sex.  Taking low-dose aspirin every day.  Taking vitamin and mineral supplements as recommended by your health care provider. What happens during an annual well check? The services and screenings done by your health care provider during your annual well check will depend on your age, overall health, lifestyle risk factors, and family history of disease. Counseling  Your health care provider may ask you questions about your:  Alcohol use.  Tobacco use.  Drug use.  Emotional well-being.  Home and  relationship well-being.  Sexual activity.  Eating habits.  History of falls.  Memory and ability to understand (cognition).  Work and work Statistician.  Reproductive health. Screening  You may have the following tests or measurements:  Height, weight, and BMI.  Blood pressure.  Lipid and cholesterol levels. These may be checked every 5 years, or more frequently if you are over 66 years old.  Skin check.  Lung cancer screening. You may have this screening every year starting at age 83 if you have a 30-pack-year history of smoking and currently smoke or have quit within the past 15 years.  Fecal occult blood test (FOBT) of the stool. You may have this test every year starting at age 83.  Flexible sigmoidoscopy or colonoscopy. You may have a sigmoidoscopy every 5 years or a colonoscopy every 10 years starting at age 83.  Hepatitis C blood test.  Hepatitis B blood test.  Sexually transmitted disease (STD) testing.  Diabetes screening. This is done by checking your blood sugar (glucose) after you have not eaten for a while (fasting). You may have this done every 1-3 years.  Bone density scan. This is done to screen for osteoporosis. You may have this done starting at age 83.  Mammogram. This may be done every 1-2 years. Talk to your health care provider about how often you should have regular mammograms. Talk with your health care provider about your test results, treatment options, and if necessary, the need for more tests. Vaccines  Your health care provider may recommend certain vaccines, such as:  Influenza vaccine. This is recommended every year.  Tetanus, diphtheria, and acellular  pertussis (Tdap, Td) vaccine. You may need a Td booster every 10 years.  Zoster vaccine. You may need this after age 83.  Pneumococcal 13-valent conjugate (PCV13) vaccine. One dose is recommended after age 83.  Pneumococcal polysaccharide (PPSV23) vaccine. One dose is recommended after  age 83. Talk to your health care provider about which screenings and vaccines you need and how often you need them. This information is not intended to replace advice given to you by your health care provider. Make sure you discuss any questions you have with your health care provider. Document Released: 12/25/2015 Document Revised: 08/17/2016 Document Reviewed: 09/29/2015 Elsevier Interactive Patient Education  2017 [place] Prevention in the Home Falls can cause injuries. They can happen to people of all ages. There are many things you can do to make your home safe and to help prevent falls. What can I do on the outside of my home?  Regularly fix the edges of walkways and driveways and fix any cracks.  Remove anything that might make you trip as you walk through a door, such as a raised step or threshold.  Trim any bushes or trees on the path to your home.  Use bright outdoor lighting.  Clear any walking paths of anything that might make someone trip, such as rocks or tools.  Regularly check to see if handrails are loose or broken. Make sure that both sides of any steps have handrails.  Any raised decks and porches should have guardrails on the edges.  Have any leaves, snow, or ice cleared regularly.  Use sand or salt on walking paths during winter.  Clean up any spills in your garage right away. This includes oil or grease spills. What can I do in the bathroom?  Use night lights.  Install grab bars by the toilet and in the tub and shower. Do not use towel bars as grab bars.  Use non-skid mats or decals in the tub or shower.  If you need to sit down in the shower, use a plastic, non-slip stool.  Keep the floor dry. Clean up any water that spills on the floor as soon as it happens.  Remove soap buildup in the tub or shower regularly.  Attach bath mats securely with double-sided non-slip rug tape.  Do not have throw rugs and other things on the floor that can  make you trip. What can I do in the bedroom?  Use night lights.  Make sure that you have a light by your bed that is easy to reach.  Do not use any sheets or blankets that are too big for your bed. They should not hang down onto the floor.  Have a firm chair that has side arms. You can use this for support while you get dressed.  Do not have throw rugs and other things on the floor that can make you trip. What can I do in the kitchen?  Clean up any spills right away.  Avoid walking on wet floors.  Keep items that you use a lot in easy-to-reach places.  If you need to reach something above you, use a strong step stool that has a grab bar.  Keep electrical cords out of the way.  Do not use floor polish or wax that makes floors slippery. If you must use wax, use non-skid floor wax.  Do not have throw rugs and other things on the floor that can make you trip. What can I do with my stairs?  Do not leave any items on the stairs.  Make sure that there are handrails on both sides of the stairs and use them. Fix handrails that are broken or loose. Make sure that handrails are as long as the stairways.  Check any carpeting to make sure that it is firmly attached to the stairs. Fix any carpet that is loose or worn.  Avoid having throw rugs at the top or bottom of the stairs. If you do have throw rugs, attach them to the floor with carpet tape.  Make sure that you have a light switch at the top of the stairs and the bottom of the stairs. If you do not have them, ask someone to add them for you. What else can I do to help prevent falls?  Wear shoes that:  Do not have high heels.  Have rubber bottoms.  Are comfortable and fit you well.  Are closed at the toe. Do not wear sandals.  If you use a stepladder:  Make sure that it is fully opened. Do not climb a closed stepladder.  Make sure that both sides of the stepladder are locked into place.  Ask someone to hold it for you,  if possible.  Clearly mark and make sure that you can see:  Any grab bars or handrails.  First and last steps.  Where the edge of each step is.  Use tools that help you move around (mobility aids) if they are needed. These include:  Canes.  Walkers.  Scooters.  Crutches.  Turn on the lights when you go into a dark area. Replace any light bulbs as soon as they burn out.  Set up your furniture so you have a clear path. Avoid moving your furniture around.  If any of your floors are uneven, fix them.  If there are any pets around you, be aware of where they are.  Review your medicines with your doctor. Some medicines can make you feel dizzy. This can increase your chance of falling. Ask your doctor what other things that you can do to help prevent falls. This information is not intended to replace advice given to you by your health care provider. Make sure you discuss any questions you have with your health care provider. Document Released: 09/24/2009 Document Revised: 05/05/2016 Document Reviewed: 01/02/2015 Elsevier Interactive Patient Education  2017 [employer].

## 2019-05-08 NOTE — Progress Notes (Signed)
Subjective:   Tenelle Andreason is a 85 y.o. female who presents for Medicare Annual (Subsequent) preventive examination.  Review of Systems:   Cardiac Risk Factors include: advanced age (>37men, >54 women);diabetes mellitus;dyslipidemia;hypertension;smoking/ tobacco exposure     Objective:     Vitals: There were no vitals taken for this visit.  There is no height or weight on file to calculate BMI.  Advanced Directives 05/08/2019 11/12/2018 11/12/2018 07/02/2018 02/02/2017 06/02/2016 02/01/2016  Does Patient Have a Medical Advance Directive? No - Yes Yes Yes No Yes  Type of Advance Directive - Clinical cytogeneticist of [employer] Power of [place];Living will [place];Living will - [place];Living will  Does patient want to make changes to medical advance directive? - - No - Patient declined - - - -  Copy of [place] in Chart? - - - - No - copy requested - No - copy requested  Would patient like information on creating a medical advance directive? Yes (ED - Information included in AVS) - - - - - -    Tobacco Social History   Tobacco Use  Smoking Status Former Smoker  . Last attempt to quit: 12/12/1968  . Years since quitting: 50.4  Smokeless Tobacco Never Used  Tobacco Comment   quit in 1970     Counseling given: Not Answered Comment: quit in 1970   Clinical Intake:  Pre-visit preparation completed: No  Pain : 0-10 Pain Score: 7  Pain Type: Chronic pain Pain Location: Hip Pain Orientation: Left Pain Radiating Towards: no Pain Descriptors / Indicators: Constant Pain Onset: Other (comment)(off and on for several years ) Pain Frequency: Intermittent Pain Relieving Factors: pain medication  Effect of Pain on Daily Activities: walking   Pain Relieving Factors: pain medication   Nutritional Risks: None Diabetes: Yes CBG done?: No Did pt. bring in CBG monitor from home?: No(recalls CBG  in 100's- 150's )  How often do you need to have someone help you when you read instructions, pamphlets, or other written materials from your doctor or pharmacy?: 1 - Never What is the last grade level you completed in school?: College   Interpreter Needed?: No  Information entered by :: Mikita Lesmeister FNP-C   Past Medical History:  Diagnosis Date  . Anemia, unspecified   . Anorexia   . Chronic kidney disease   . Chronic kidney disease, stage III (moderate) (HCC)   . Cystocele, midline   . Disorder of bone and cartilage, unspecified   . Kyphosis associated with other condition   . Obesity, unspecified   . Osteoarthrosis, unspecified whether generalized or localized, unspecified site   . Other and unspecified hyperlipidemia   . Other hammer toe (acquired)   . Postmenopausal atrophic vaginitis   . PVC's (premature ventricular contractions) 01/29/2015  . RBBB 01/29/2015  . Reflux esophagitis   . Type II or unspecified type diabetes mellitus without mention of complication, not stated as uncontrolled   . Unspecified constipation   . Unspecified disorder of kidney and ureter   . Unspecified essential hypertension   . Unspecified hereditary and idiopathic peripheral neuropathy   . Unspecified urinary incontinence    Past Surgical History:  Procedure Laterality Date  . BLADDER SURGERY     tie up in 2013  . EYE SURGERY  11/2016   Bilateral Cataract Sx  . TUBAL LIGATION     Family History  Problem Relation Age of Onset  . Heart disease  Mother   . Heart disease Sister   . Cancer Son   . Cancer Son   . Stomach cancer Neg Hx   . Colon cancer Neg Hx   . Pancreatic cancer Neg Hx    Social History   Socioeconomic History  . Marital status: Widowed    Spouse name: Not on file  . Number of children: 5  . Years of education: 16  . Highest education level: Bachelor's degree (e.g., BA, AB, BS)  Occupational History  . Occupation: retired Occupational psychologist  Social Needs   . Financial resource strain: Not hard at all  . Food insecurity:    Worry: Never true    Inability: Never true  . Transportation needs:    Medical: No    Non-medical: No  Tobacco Use  . Smoking status: Former Smoker    Last attempt to quit: 12/12/1968    Years since quitting: 50.4  . Smokeless tobacco: Never Used  . Tobacco comment: quit in 1970  Substance and Sexual Activity  . Alcohol use: No    Alcohol/week: 0.0 standard drinks  . Drug use: No  . Sexual activity: Never  Lifestyle  . Physical activity:    Days per week: 1 day    Minutes per session: 60 min  . Stress: Not at all  Relationships  . Social connections:    Talks on phone: More than three times a week    Gets together: More than three times a week    Attends religious service: 1 to 4 times per year    Active member of club or organization: Yes    Attends meetings of clubs or organizations: 1 to 4 times per year    Relationship status: Widowed  Other Topics Concern  . Not on file  Social History Narrative   Widow - husband died 67   Lives one story house -son and grandson lives with her   Former smoker -stopped 1970   Alcohol none   Exercise -some walking   POA, Living Will    Outpatient Encounter Medications as of 05/08/2019  Medication Sig  . aspirin 81 MG tablet Take 81 mg by mouth daily.  . calcium-vitamin D (OSCAL WITH D) 500-200 MG-UNIT per tablet Take 1 tablet by mouth daily.  . cholecalciferol (VITAMIN D) 1000 UNITS tablet Take 1,000 Units by mouth daily.  Marland Kitchen gabapentin (NEURONTIN) 300 MG capsule Take one tablet by mouth in the morning and one tablet by mouth in the evening for pains  . glipiZIDE (GLUCOTROL) 5 MG tablet Take 1 tablet (5 mg total) by mouth daily.  Marland Kitchen glucose blood (FREESTYLE LITE) test strip Use to test blood sugar once daily. Dx: E11.21  . Lancets (FREESTYLE) lancets Use to test blood sugar. Dx: E11.21  . linagliptin (TRADJENTA) 5 MG TABS tablet Take 1 tablet (5 mg total) by mouth  daily.  Marland Kitchen lisinopril-hydrochlorothiazide (PRINZIDE,ZESTORETIC) 20-12.5 MG tablet Take one tablet by mouth once daily to control blood pressure  . oxyCODONE-acetaminophen (ROXICET) 5-325 MG tablet Take 1 tablet by mouth every 8 (eight) hours as needed for severe pain.  . pravastatin (PRAVACHOL) 20 MG tablet Take one tablet with evening meal.  . vitamin E 400 UNIT capsule Take 400 Units by mouth daily.   No facility-administered encounter medications on file as of 05/08/2019.     Activities of Daily Living In your present state of health, do you have any difficulty performing the following activities: 05/08/2019  Hearing? N  Vision? N  Difficulty concentrating or making decisions? N  Walking or climbing stairs? N  Dressing or bathing? N  Doing errands, shopping? Y  Comment family Doctor, hospital and eating ? Y  Comment family assist   Using the Toilet? N  In the past six months, have you accidently leaked urine? N  Do you have problems with loss of bowel control? N  Managing your Medications? N  Managing your Finances? N  Housekeeping or managing your Housekeeping? Y  Comment family assist   Some recent data might be hidden    Patient Care Team: Gayland Curry, DO as PCP - General (Geriatric Medicine) Clent Jacks, MD as Consulting Physician (Ophthalmology)    Assessment:   This is a routine wellness examination for [place].  Exercise Activities and Dietary recommendations Current Exercise Habits: Home exercise routine, Type of exercise: stretching, Time (Minutes): 10, Frequency (Times/Week): 3, Weekly Exercise (Minutes/Week): 30, Intensity: Mild, Exercise limited by: None identified  Goals    . <enter goal here>     Starting 02/02/17, I will maintain my current lifestyle.        Fall Risk Fall Risk  05/08/2019 04/04/2019 11/26/2018 06/11/2018 11/16/2017  Falls in the past year? 0 0 0 No No  Number falls in past yr: 0 0 0 - -  Injury with Fall? 0 0 0 - -  Comment - -  - - -  Risk for fall due to : - - - - -  Follow up - - - - -   Is the patient's home free of loose throw rugs in walkways, pet beds, electrical cords, etc?   no      Grab bars in the bathroom? no      Handrails on the stairs?   no stairs       Adequate lighting?   yes  Depression Screen PHQ 2/9 Scores 05/08/2019 04/04/2019 11/26/2018 06/11/2018  PHQ - 2 Score 0 0 0 0     Cognitive Function MMSE - Mini Mental State Exam 02/08/2018 02/02/2017 02/01/2016 01/06/2015  Orientation to time 5 5 5 5   Orientation to Place 5 5 5 5   Registration 3 3 3 3   Attention/ Calculation 5 5 5 5   Recall 3 3 3 2   Language- name 2 objects 2 2 2 2   Language- repeat 1 1 1 1   Language- follow 3 step command 3 3 3 3   Language- read & follow direction 1 1 1 1   Write a sentence 1 1 1 1   Copy design 1 1 1 1   Total score 30 30 30 29      6CIT Screen 05/08/2019  What Year? 0 points  What month? 0 points  What time? 0 points  Count back from 20 0 points  Months in reverse 0 points  Repeat phrase 6 points  Total Score 6    Immunization History  Administered Date(s) Administered  . Influenza, High Dose Seasonal PF 09/11/2018  . Influenza,inj,Quad PF,6+ Mos 10/08/2013, 09/30/2014  . Influenza-Unspecified 10/17/2012, 09/12/2015, 10/01/2016, 09/23/2017  . Pneumococcal Conjugate-13 10/31/1997, 07/29/2014  . Pneumococcal Polysaccharide-23 07/30/2015  . Tdap 10/31/1997, 03/12/2014, 10/01/2016  . Zoster 07/30/2014    Qualifies for Shingles Vaccine? Up to date   Screening Tests Health Maintenance  Topic Date Due  . INFLUENZA VACCINE  07/13/2019  . HEMOGLOBIN A1C  09/28/2019  . FOOT EXAM  11/27/2019  . OPHTHALMOLOGY EXAM  12/04/2019  . TETANUS/TDAP  10/01/2026  . DEXA SCAN  Completed  . PNA vac Low Risk Adult  Completed    Cancer Screenings: Lung: Low Dose CT Chest recommended if Age 42-80 years, 30 pack-year currently smoking OR have quit w/in 15years. Patient does not qualify. Breast:  Up to date on  Mammogram? N/A  Up to date of Bone Density/Dexa? Yes Colorectal: done 2019   Additional Screenings: Hepatitis C Screening: Low risk   Plan:  - Due for foot and eye exam has appointment with ophthalmology. I have personally reviewed and noted the following in the patient's chart:   . Medical and social history . Use of alcohol, tobacco or illicit drugs  . Current medications and supplements . Functional ability and status . Nutritional status . Physical activity . Advanced directives . List of other physicians . Hospitalizations, surgeries, and ER visits in previous 12 months . Vitals . Screenings to include cognitive, depression, and falls . Referrals and appointments  In addition, I have reviewed and discussed with patient certain preventive protocols, quality metrics, and best practice recommendations. A written personalized care plan for preventive services as well as general preventive health recommendations were provided to patient.   Sandrea Hughs, NP  05/08/2019

## 2019-05-08 NOTE — Progress Notes (Signed)
   This service is provided via telemedicine  No vital signs collected/recorded due to the encounter was a telemedicine visit.   Location of patient (ex: home, work):  Home   Patient consents to a telephone visit: Yes   Location of the provider (ex: office, home):  Office   Name of any referring provider:  Dr. Hollace Kinnier   Names of all persons participating in the telemedicine service and their role in the encounter:  Ruthell Rummage CMA, [name] [name] NP, Scarlette Calico Scull   Time spent on call:  Ruthell Rummage CMA spent 11 Minutes on phone with patient

## 2019-06-03 ENCOUNTER — Ambulatory Visit (INDEPENDENT_AMBULATORY_CARE_PROVIDER_SITE_OTHER): Payer: Medicare Other | Admitting: Podiatry

## 2019-06-03 ENCOUNTER — Encounter

## 2019-06-03 ENCOUNTER — Encounter: Payer: Self-pay | Admitting: Podiatry

## 2019-06-03 ENCOUNTER — Other Ambulatory Visit: Payer: Self-pay

## 2019-06-03 DIAGNOSIS — M79676 Pain in unspecified toe(s): Secondary | ICD-10-CM | POA: Diagnosis not present

## 2019-06-03 DIAGNOSIS — B351 Tinea unguium: Secondary | ICD-10-CM | POA: Diagnosis not present

## 2019-06-03 DIAGNOSIS — L84 Corns and callosities: Secondary | ICD-10-CM

## 2019-06-03 DIAGNOSIS — E1142 Type 2 diabetes mellitus with diabetic polyneuropathy: Secondary | ICD-10-CM

## 2019-06-03 NOTE — Patient Instructions (Signed)
Diabetes Mellitus and Foot Care  Foot care is an important part of your health, especially when you have diabetes. Diabetes may cause you to have problems because of poor blood flow (circulation) to your feet and legs, which can cause your skin to:   Become thinner and drier.   Break more easily.   Heal more slowly.   Peel and crack.  You may also have nerve damage (neuropathy) in your legs and feet, causing decreased feeling in them. This means that you may not notice minor injuries to your feet that could lead to more serious problems. Noticing and addressing any potential problems early is the best way to prevent future foot problems.  How to care for your feet  Foot hygiene   Wash your feet daily with warm water and mild soap. Do not use hot water. Then, pat your feet and the areas between your toes until they are completely dry. Do not soak your feet as this can dry your skin.   Trim your toenails straight across. Do not dig under them or around the cuticle. File the edges of your nails with an emery board or nail file.   Apply a moisturizing lotion or petroleum jelly to the skin on your feet and to dry, brittle toenails. Use lotion that does not contain alcohol and is unscented. Do not apply lotion between your toes.  Shoes and socks   Wear clean socks or stockings every day. Make sure they are not too tight. Do not wear knee-high stockings since they may decrease blood flow to your legs.   Wear shoes that fit properly and have enough cushioning. Always look in your shoes before you put them on to be sure there are no objects inside.   To break in new shoes, wear them for just a few hours a day. This prevents injuries on your feet.  Wounds, scrapes, corns, and calluses   Check your feet daily for blisters, cuts, bruises, sores, and redness. If you cannot see the bottom of your feet, use a mirror or ask someone for help.   Do not cut corns or calluses or try to remove them with medicine.   If you  find a minor scrape, cut, or break in the skin on your feet, keep it and the skin around it clean and dry. You may clean these areas with mild soap and water. Do not clean the area with peroxide, alcohol, or iodine.   If you have a wound, scrape, corn, or callus on your foot, look at it several times a day to make sure it is healing and not infected. Check for:  ? Redness, swelling, or pain.  ? Fluid or blood.  ? Warmth.  ? Pus or a bad smell.  General instructions   Do not cross your legs. This may decrease blood flow to your feet.   Do not use heating pads or hot water bottles on your feet. They may burn your skin. If you have lost feeling in your feet or legs, you may not know this is happening until it is too late.   Protect your feet from hot and cold by wearing shoes, such as at the beach or on hot pavement.   Schedule a complete foot exam at least once a year (annually) or more often if you have foot problems. If you have foot problems, report any cuts, sores, or bruises to your health care provider immediately.  Contact a health care provider if:     You have a medical condition that increases your risk of infection and you have any cuts, sores, or bruises on your feet.   You have an injury that is not healing.   You have redness on your legs or feet.   You feel burning or tingling in your legs or feet.   You have pain or cramps in your legs and feet.   Your legs or feet are numb.   Your feet always feel cold.   You have pain around a toenail.  Get help right away if:   You have a wound, scrape, corn, or callus on your foot and:  ? You have pain, swelling, or redness that gets worse.  ? You have fluid or blood coming from the wound, scrape, corn, or callus.  ? Your wound, scrape, corn, or callus feels warm to the touch.  ? You have pus or a bad smell coming from the wound, scrape, corn, or callus.  ? You have a fever.  ? You have a red line going up your leg.  Summary   Check your feet every day  for cuts, sores, red spots, swelling, and blisters.   Moisturize feet and legs daily.   Wear shoes that fit properly and have enough cushioning.   If you have foot problems, report any cuts, sores, or bruises to your health care provider immediately.   Schedule a complete foot exam at least once a year (annually) or more often if you have foot problems.  This information is not intended to replace advice given to you by your health care provider. Make sure you discuss any questions you have with your health care provider.  Document Released: 11/25/2000 Document Revised: 01/10/2018 Document Reviewed: 12/30/2016  Elsevier Interactive Patient Education  2019 [employer]

## 2019-06-03 NOTE — Progress Notes (Signed)
Subjective: Dawn [name] presents with diabetes, diabetic neuropathy and cc of painful, discolored, thick toenails and painful callus/corn which interfere with activities of daily living. Pain is aggravated when wearing enclosed shoe gear. Pain is relieved with periodic professional debridement.  Gayland Curry, DO is her PCP and last visit was 04/04/2019.   Current Outpatient Medications:  .  aspirin 81 MG tablet, Take 81 mg by mouth daily., Disp: , Rfl:  .  calcium-vitamin D (OSCAL WITH D) 500-200 MG-UNIT per tablet, Take 1 tablet by mouth daily., Disp: , Rfl:  .  cholecalciferol (VITAMIN D) 1000 UNITS tablet, Take 1,000 Units by mouth daily., Disp: , Rfl:  .  gabapentin (NEURONTIN) 300 MG capsule, Take one tablet by mouth in the morning and one tablet by mouth in the evening for pains, Disp: 180 capsule, Rfl: 3 .  glipiZIDE (GLUCOTROL) 5 MG tablet, Take 1 tablet (5 mg total) by mouth daily., Disp: 90 tablet, Rfl: 1 .  glucose blood (FREESTYLE LITE) test strip, Use to test blood sugar once daily. Dx: E11.21, Disp: 300 each, Rfl: 3 .  Lancets (FREESTYLE) lancets, Use to test blood sugar. Dx: E11.21, Disp: 300 each, Rfl: 3 .  linagliptin (TRADJENTA) 5 MG TABS tablet, Take 1 tablet (5 mg total) by mouth daily., Disp: 90 tablet, Rfl: 1 .  lisinopril-hydrochlorothiazide (PRINZIDE,ZESTORETIC) 20-12.5 MG tablet, Take one tablet by mouth once daily to control blood pressure, Disp: 90 tablet, Rfl: 3 .  oxyCODONE-acetaminophen (ROXICET) 5-325 MG tablet, Take 1 tablet by mouth every 8 (eight) hours as needed for severe pain., Disp: 90 tablet, Rfl: 0 .  pravastatin (PRAVACHOL) 20 MG tablet, Take one tablet with evening meal., Disp: 90 tablet, Rfl: 1 .  vitamin E 400 UNIT capsule, Take 400 Units by mouth daily., Disp: , Rfl:   Allergies  Allergen Reactions  . Keflex [Cephalexin]   . Metronidazole   . Simvastatin     Leg cramsps    Objective: There were no vitals filed for this visit.  Vascular  Examination: Capillary refill time <3 seconds x 10 digits.  Dorsalis pedis pulses 1/4 b/l.  Posterior tibial pulses 1/4 b/l.  Digital hair absent x 10 digits.  Skin temperature gradient WNL b/l.  Dermatological Examination: Skin with normal turgor, texture and tone b/l.  Toenails 1-5 b/l discolored, thick, dystrophic with subungual debris and pain with palpation to nailbeds due to thickness of nails.  Hyperkeratotic lesion(s) dorsal PIPJ of 3rd and 5th digits right foot. No erythema, no edema, no drainage, no flocculence noted.   Musculoskeletal: Muscle strength 5/5 to all LE muscle groups.  Hammertoe deformity 2-5 b/l.  Pes planus foot type b/l.  No pain, crepitus or joint limitation with passive/active ROM.  Neurological: Sensation diminished with 10 gram monofilament.  Vibratory sensation diminished.  Assessment: 1. Painful onychomycosis toenails 1-5 b/l 2. Corn right 2nd, right 5th digits  3. NIDDM with Diabetic neuropathy  Plan: 1. Continue diabetic foot care principles. Literature dispensed on today. 2. Toenails 1-5 b/l were debrided in length and girth without iatrogenic bleeding. 3. Corn(s) 2nd and 5th digits right foot pared utilizing sterile scalpel blade without incident.  4. Patient to continue soft, supportive shoe gear 5. Patient to report any pedal injuries to medical professional  6. Follow up 3 months.  7. Patient/POA to call should there be a concern in the interim.

## 2019-06-24 ENCOUNTER — Other Ambulatory Visit: Payer: Self-pay | Admitting: *Deleted

## 2019-06-24 DIAGNOSIS — M48062 Spinal stenosis, lumbar region with neurogenic claudication: Secondary | ICD-10-CM

## 2019-06-24 MED ORDER — OXYCODONE-ACETAMINOPHEN 5-325 MG PO TABS: 1.0000 | ORAL_TABLET | Freq: Three times a day (TID) | ORAL | 0 refills | Status: DC | PRN

## 2019-06-24 NOTE — Telephone Encounter (Signed)
Patient requested refill [place] Database Verified LR: 05/07/2019 Pended Rx and sent to Dr. Mariea Clonts for approval.

## 2019-08-01 ENCOUNTER — Other Ambulatory Visit: Payer: Self-pay

## 2019-08-01 ENCOUNTER — Other Ambulatory Visit: Payer: Medicare Other

## 2019-08-01 DIAGNOSIS — E1142 Type 2 diabetes mellitus with diabetic polyneuropathy: Secondary | ICD-10-CM

## 2019-08-01 DIAGNOSIS — E782 Mixed hyperlipidemia: Secondary | ICD-10-CM

## 2019-08-01 DIAGNOSIS — E1122 Type 2 diabetes mellitus with diabetic chronic kidney disease: Secondary | ICD-10-CM

## 2019-08-02 LAB — CBC WITH DIFFERENTIAL/PLATELET
Absolute Monocytes: 624 cells/uL (ref 200–950)
Basophils Absolute: 62 cells/uL (ref 0–200)
Basophils Relative: 0.8 %
Eosinophils Absolute: 400 cells/uL (ref 15–500)
Eosinophils Relative: 5.2 %
HCT: 34.9 % — ABNORMAL LOW (ref 35.0–45.0)
Hemoglobin: 10.9 g/dL — ABNORMAL LOW (ref 11.7–15.5)
Lymphs Abs: 2503 cells/uL (ref 850–3900)
MCH: 27.4 pg (ref 27.0–33.0)
MCHC: 31.2 g/dL — ABNORMAL LOW (ref 32.0–36.0)
MCV: 87.7 fL (ref 80.0–100.0)
MPV: 11.9 fL (ref 7.5–12.5)
Monocytes Relative: 8.1 %
Neutro Abs: 4112 cells/uL (ref 1500–7800)
Neutrophils Relative %: 53.4 %
Platelets: 150 10*3/uL (ref 140–400)
RBC: 3.98 10*6/uL (ref 3.80–5.10)
RDW: 14.1 % (ref 11.0–15.0)
Total Lymphocyte: 32.5 %
WBC: 7.7 10*3/uL (ref 3.8–10.8)

## 2019-08-02 LAB — COMPLETE METABOLIC PANEL WITH GFR
AG Ratio: 1.4 (calc) (ref 1.0–2.5)
ALT: 14 U/L (ref 6–29)
AST: 19 U/L (ref 10–35)
Albumin: 3.9 g/dL (ref 3.6–5.1)
Alkaline phosphatase (APISO): 69 U/L (ref 37–153)
BUN/Creatinine Ratio: 24 (calc) — ABNORMAL HIGH (ref 6–22)
BUN: 38 mg/dL — ABNORMAL HIGH (ref 7–25)
CO2: 26 mmol/L (ref 20–32)
Calcium: 9.2 mg/dL (ref 8.6–10.4)
Chloride: 106 mmol/L (ref 98–110)
Creat: 1.59 mg/dL — ABNORMAL HIGH (ref 0.60–0.88)
GFR, Est African American: 33 mL/min/{1.73_m2} — ABNORMAL LOW (ref >=60)
GFR, Est Non African American: 28 mL/min/{1.73_m2} — ABNORMAL LOW (ref >=60)
Globulin: 2.7 g/dL (calc) (ref 1.9–3.7)
Glucose, Bld: 110 mg/dL — ABNORMAL HIGH (ref 65–99)
Potassium: 4.2 mmol/L (ref 3.5–5.3)
Sodium: 142 mmol/L (ref 135–146)
Total Bilirubin: 0.5 mg/dL (ref 0.2–1.2)
Total Protein: 6.6 g/dL (ref 6.1–8.1)

## 2019-08-02 LAB — LIPID PANEL
Cholesterol: 161 mg/dL (ref <200)
HDL: 49 mg/dL — ABNORMAL LOW (ref >=50)
LDL Cholesterol (Calc): 95 mg/dL (calc)
Non-HDL Cholesterol (Calc): 112 mg/dL (calc) (ref <130)
Total CHOL/HDL Ratio: 3.3 (calc) (ref <5.0)
Triglycerides: 83 mg/dL (ref <150)

## 2019-08-02 LAB — HEMOGLOBIN A1C
Hgb A1c MFr Bld: 6.3 % of total Hgb — ABNORMAL HIGH (ref <5.7)
Mean Plasma Glucose: 134 (calc)
eAG (mmol/L): 7.4 (calc)

## 2019-08-08 ENCOUNTER — Encounter: Payer: Self-pay | Admitting: Internal Medicine

## 2019-08-08 ENCOUNTER — Other Ambulatory Visit: Payer: Self-pay

## 2019-08-08 ENCOUNTER — Ambulatory Visit (INDEPENDENT_AMBULATORY_CARE_PROVIDER_SITE_OTHER): Payer: Medicare Other | Admitting: Internal Medicine

## 2019-08-08 VITALS — BP 128/70 | HR 85 | Temp 97.5°F | Ht 65.0 in | Wt 165.0 lb

## 2019-08-08 DIAGNOSIS — N184 Chronic kidney disease, stage 4 (severe): Secondary | ICD-10-CM

## 2019-08-08 DIAGNOSIS — E782 Mixed hyperlipidemia: Secondary | ICD-10-CM

## 2019-08-08 DIAGNOSIS — E1122 Type 2 diabetes mellitus with diabetic chronic kidney disease: Secondary | ICD-10-CM

## 2019-08-08 DIAGNOSIS — Z23 Encounter for immunization: Secondary | ICD-10-CM | POA: Diagnosis not present

## 2019-08-08 DIAGNOSIS — K529 Noninfective gastroenteritis and colitis, unspecified: Secondary | ICD-10-CM

## 2019-08-08 DIAGNOSIS — M159 Polyosteoarthritis, unspecified: Secondary | ICD-10-CM

## 2019-08-08 DIAGNOSIS — M48062 Spinal stenosis, lumbar region with neurogenic claudication: Secondary | ICD-10-CM | POA: Diagnosis not present

## 2019-08-08 DIAGNOSIS — L739 Follicular disorder, unspecified: Secondary | ICD-10-CM | POA: Diagnosis not present

## 2019-08-08 MED ORDER — DESOXIMETASONE 0.25 % EX CREA: 1.0000 "application " | TOPICAL_CREAM | Freq: Two times a day (BID) | CUTANEOUS | 1 refills | Status: DC

## 2019-08-08 MED ORDER — OXYCODONE-ACETAMINOPHEN 5-325 MG PO TABS: 1.0000 | ORAL_TABLET | Freq: Three times a day (TID) | ORAL | 0 refills | Status: DC | PRN

## 2019-08-08 NOTE — Progress Notes (Signed)
Location:  [facility] clinic Provider:  Naijah Lacek L. Mariea Clonts, D.O., C.M.D.  Goals of Care:  Advanced Directives 05/08/2019  Does Patient Have a Medical Advance Directive? No  Type of Advance Directive -  Does patient want to make changes to medical advance directive? -  Copy of [place] in Chart? -  Would patient like information on creating a medical advance directive? Yes (ED - Information included in AVS)     Chief Complaint  Patient presents with  . Medical Management of Chronic Issues    39mth follow-up, left leg rash    HPI: Patient is a 85 y.o. female seen today for medical management of chronic diseases.    She also has a left leg rash--on shin--sometimes gets pus bumps on there.  She's had this off and on for years.  Actually still gets a little bit of hair on the shins.  Has good pulses in feet.    She is slowing down some.  Does not walk as much due to arthritis pains.  Feels well overall.  No chest pain or shortness of breath.  Continues to need percocet for her chronic pain in her back and joints.  otc meds were no longer effective and she cannot move and function without opioid therapy.  Her children are doing her shopping most of the time and she's staying home amid covid.    We reviewed all of her labs and they have been stable.    Past Medical History:  Diagnosis Date  . Anemia, unspecified   . Anorexia   . Chronic kidney disease   . Chronic kidney disease, stage III (moderate) (HCC)   . Cystocele, midline   . Disorder of bone and cartilage, unspecified   . Kyphosis associated with other condition   . Obesity, unspecified   . Osteoarthrosis, unspecified whether generalized or localized, unspecified site   . Other and unspecified hyperlipidemia   . Other hammer toe (acquired)   . Postmenopausal atrophic vaginitis   . PVC's (premature ventricular contractions) 01/29/2015  . RBBB 01/29/2015  . Reflux esophagitis   . Type II or unspecified type  diabetes mellitus without mention of complication, not stated as uncontrolled   . Unspecified constipation   . Unspecified disorder of kidney and ureter   . Unspecified essential hypertension   . Unspecified hereditary and idiopathic peripheral neuropathy   . Unspecified urinary incontinence     Past Surgical History:  Procedure Laterality Date  . BLADDER SURGERY     tie up in 2013  . EYE SURGERY  11/2016   Bilateral Cataract Sx  . TUBAL LIGATION      Allergies  Allergen Reactions  . Keflex [Cephalexin]   . Metronidazole   . Simvastatin     Leg cramsps    Outpatient Encounter Medications as of 08/08/2019  Medication Sig  . aspirin 81 MG tablet Take 81 mg by mouth daily.  . calcium-vitamin D (OSCAL WITH D) 500-200 MG-UNIT per tablet Take 1 tablet by mouth daily.  . cholecalciferol (VITAMIN D) 1000 UNITS tablet Take 1,000 Units by mouth daily.  Marland Kitchen gabapentin (NEURONTIN) 300 MG capsule Take one tablet by mouth in the morning and one tablet by mouth in the evening for pains  . glipiZIDE (GLUCOTROL) 5 MG tablet Take 1 tablet (5 mg total) by mouth daily.  Marland Kitchen glucose blood (FREESTYLE LITE) test strip Use to test blood sugar once daily. Dx: E11.21  . Lancets (FREESTYLE) lancets Use to test blood  sugar. Dx: E11.21  . linagliptin (TRADJENTA) 5 MG TABS tablet Take 1 tablet (5 mg total) by mouth daily.  Marland Kitchen lisinopril-hydrochlorothiazide (PRINZIDE,ZESTORETIC) 20-12.5 MG tablet Take one tablet by mouth once daily to control blood pressure  . oxyCODONE-acetaminophen (ROXICET) 5-325 MG tablet Take 1 tablet by mouth every 8 (eight) hours as needed for severe pain.  . pravastatin (PRAVACHOL) 20 MG tablet Take one tablet with evening meal.  . vitamin E 400 UNIT capsule Take 400 Units by mouth daily.   No facility-administered encounter medications on file as of 08/08/2019.     Review of Systems:  Review of Systems  Constitutional: Negative for chills, fever and malaise/fatigue.       Weight is  up--less mobile and had lost quite a bit of weight while she had some colitis  Eyes: Negative for blurred vision.       Glasses  Respiratory: Negative for cough and shortness of breath.   Cardiovascular: Negative for chest pain, palpitations and leg swelling.  Gastrointestinal: Negative for abdominal pain, blood in stool, constipation, diarrhea and melena.  Genitourinary: Negative for dysuria.  Musculoskeletal: Positive for back pain and joint pain. Negative for falls.  Skin: Negative for itching and rash.  Neurological: Negative for dizziness and loss of consciousness.  Endo/Heme/Allergies: Does not bruise/bleed easily.  Psychiatric/Behavioral: Negative for depression and memory loss. The patient is not nervous/anxious and does not have insomnia.     Health Maintenance  Topic Date Due  . INFLUENZA VACCINE  07/13/2019  . FOOT EXAM  11/27/2019  . OPHTHALMOLOGY EXAM  12/04/2019  . HEMOGLOBIN A1C  02/01/2020  . TETANUS/TDAP  10/01/2026  . DEXA SCAN  Completed  . PNA vac Low Risk Adult  Completed    Physical Exam: Vitals:   08/08/19 1309  BP: 128/70  Pulse: 85  Temp: (!) 97.5 F (36.4 C)  TempSrc: Oral  SpO2: 95%  Weight: 165 lb (74.8 kg)  Height: 5\' 5"  (1.651 m)   Body mass index is 27.46 kg/m. Physical Exam Vitals signs reviewed.  Constitutional:      General: She is not in acute distress.    Appearance: Normal appearance. She is not toxic-appearing.  HENT:     Head: Normocephalic and atraumatic.  Eyes:     Comments: glasses  Cardiovascular:     Rate and Rhythm: Normal rate and regular rhythm.     Pulses: Normal pulses.     Heart sounds: Normal heart sounds.  Pulmonary:     Effort: Pulmonary effort is normal.     Breath sounds: Normal breath sounds. No wheezing, rhonchi or rales.  Abdominal:     General: Bowel sounds are normal.     Palpations: Abdomen is soft.     Tenderness: There is no abdominal tenderness. There is no guarding or rebound.  Musculoskeletal:  Normal range of motion.     Comments: Both legs with darkening of distal shins; some areas of excoriation and prior sites of pustules (per pt) visible on left shin--scarring present, but no active pustules  Skin:    General: Skin is warm and dry.  Neurological:     General: No focal deficit present.     Mental Status: She is alert and oriented to person, place, and time. Mental status is at baseline.     Cranial Nerves: No cranial nerve deficit.     Motor: No weakness.     Gait: Gait abnormal (gait has slowed considerably; using cane ).  Psychiatric:  Mood and Affect: Mood normal.        Behavior: Behavior normal.        Thought Content: Thought content normal.        Judgment: Judgment normal.     Labs reviewed: Basic Metabolic Panel: Recent Labs    11/26/18 1227 03/29/19 0843 08/01/19 0836  NA 142 142 142  K 4.3 4.1 4.2  CL 105 106 106  CO2 26 26 26   GLUCOSE 53* 114* 110*  BUN 32* 28* 38*  CREATININE 1.63* 1.66* 1.59*  CALCIUM 9.8 9.8 9.2   Liver Function Tests: Recent Labs    11/21/18 0917 03/29/19 0843 08/01/19 0836  AST 22 18 19   ALT 20 12 14   BILITOT 0.5 0.5 0.5  PROT 6.7 7.0 6.6   No results for input(s): LIPASE, AMYLASE in the last 8760 hours. No results for input(s): AMMONIA in the last 8760 hours. CBC: Recent Labs    11/21/18 0917 03/29/19 0843 08/01/19 0836  WBC 10.1 7.9 7.7  NEUTROABS 6,636 4,764 4,112  HGB 10.8* 11.2* 10.9*  HCT 34.4* 34.7* 34.9*  MCV 86.2 87.2 87.7  PLT 199 162 150   Lipid Panel: Recent Labs    11/26/18 1227 08/01/19 0836  CHOL 147 161  HDL 39* 49*  LDLCALC 81 95  TRIG 162* 83  CHOLHDL 3.8 3.3   Lab Results  Component Value Date   HGBA1C 6.3 (H) 08/01/2019    Assessment/Plan 1. Folliculitis -appears rash she's had has been a folliculitis--does not appear to have active rash so will resume cream that's been effective - desoximetasone (TOPICORT) 0.25 % cream; Apply 1 application topically 2 (two) times  daily.  Dispense: 30 g; Refill: 1  2. Type 2 diabetes mellitus with stage 4 chronic kidney disease, without long-term current use of insulin (HCC) -continue glipizide, tradjenta, ace, pravachol low dose -at goal, no reported lows, less active with covid and progressing arthritis   3. Spinal stenosis, lumbar region, with neurogenic claudication - remains her biggest problem and limiting condition -continues on gabapentin for radicular symptoms--cannot take more based on age and renal disease -also continue percocet which she uses as directed - oxyCODONE-acetaminophen (ROXICET) 5-325 MG tablet; Take 1 tablet by mouth every 8 (eight) hours as needed for severe pain.  Dispense: 90 tablet; Refill: 0  4. Mixed hyperlipidemia -she is 89, LDL is 95 with goal less than 70, remains on pravachol which has been affordable for her; she had leg cramps she attributed higher intensity statins, even just zocor  -cont same regimen at this point   5. Colitis -bowels are stable at present, does go several times through the day for periods of time, not recently constipated and not having bothersome diarrhea at this point -weight has stabilized (up again)  6. Generalized OA -continue her percocet at this point since otcs were not effective and nsaids are not an option in view of her renal function  7. Need for influenza vaccination - Flu Vaccine QUAD High Dose(Fluad) given  Labs/tests ordered:   Lab Orders  No laboratory test(s) ordered today   Next appt:  4 mos med mgt, no labs this time   Wyman Meschke L. Ryann Pauli, D.O. [employer] [facility] [address], [place] [address] Cell Phone (Mon-Fri 8am-5pm):  [phone] On Call:  [phone] & follow prompts after 5pm & weekends Office Phone:  [phone] Office Fax:  [phone]

## 2019-09-04 ENCOUNTER — Encounter: Payer: Self-pay | Admitting: Podiatry

## 2019-09-04 ENCOUNTER — Ambulatory Visit (INDEPENDENT_AMBULATORY_CARE_PROVIDER_SITE_OTHER): Payer: Medicare Other | Admitting: Podiatry

## 2019-09-04 ENCOUNTER — Other Ambulatory Visit: Payer: Self-pay

## 2019-09-04 DIAGNOSIS — L84 Corns and callosities: Secondary | ICD-10-CM

## 2019-09-04 DIAGNOSIS — E1142 Type 2 diabetes mellitus with diabetic polyneuropathy: Secondary | ICD-10-CM | POA: Diagnosis not present

## 2019-09-04 DIAGNOSIS — M79676 Pain in unspecified toe(s): Secondary | ICD-10-CM

## 2019-09-04 DIAGNOSIS — B351 Tinea unguium: Secondary | ICD-10-CM | POA: Diagnosis not present

## 2019-09-04 NOTE — Patient Instructions (Signed)
Diabetic Neuropathy Diabetic neuropathy refers to nerve damage that is caused by diabetes (diabetes mellitus). Over time, people with diabetes can develop nerve damage throughout the body. There are several types of diabetic neuropathy:  Peripheral neuropathy. This is the most common type of diabetic neuropathy. It causes damage to nerves that carry signals between the spinal cord and other parts of the body (peripheral nerves). This usually affects nerves in the feet and legs first, and may eventually affect the hands and arms. The damage affects the ability to sense touch or temperature.  Autonomic neuropathy. This type causes damage to nerves that control involuntary functions (autonomic nerves). These nerves carry signals that control: ? Heartbeat. ? Body temperature. ? Blood pressure. ? Urination. ? Digestion. ? Sweating. ? Sexual function. ? Response to changing blood sugar (glucose) levels.  Focal neuropathy. This type of nerve damage affects one area of the body, such as an arm, a leg, or the face. The injury may involve one nerve or a small group of nerves. Focal neuropathy can be painful and unpredictable, and occurs most often in older adults with diabetes. This often develops suddenly, but usually improves over time and does not cause long-term problems.  Proximal neuropathy. This type of nerve damage affects the nerves of the thighs, hips, buttocks, or legs. It causes severe pain, weakness, and muscle death (atrophy), usually in the thigh muscles. It is more common among older men and people who have type 2 diabetes. The length of recovery time may vary. What are the causes? Peripheral, autonomic, and focal neuropathies are caused by diabetes that is not well controlled with treatment. The cause of proximal neuropathy is not known, but it may be caused by inflammation related to uncontrolled blood glucose levels. What are the signs or symptoms? Peripheral neuropathy Peripheral  neuropathy develops slowly over time. When the nerves of the feet and legs no longer work, you may experience:  Burning, stabbing, or aching pain in the legs or feet.  Pain or cramping in the legs or feet.  Loss of feeling (numbness) and inability to feel pressure or pain in the feet. This can lead to: ? Thick calluses or sores on areas of constant pressure. ? Ulcers. ? Reduced ability to feel temperature changes.  Foot deformities.  Muscle weakness.  Loss of balance or coordination. Autonomic neuropathy The symptoms of autonomic neuropathy vary depending on which nerves are affected. Symptoms may include:  Problems with digestion, such as: ? Nausea or vomiting. ? Poor appetite. ? Bloating. ? Diarrhea or constipation. ? Trouble swallowing. ? Losing weight without trying to.  Problems with the heart, blood and lungs, such as: ? Dizziness, especially when standing up. ? Fainting. ? Shortness of breath. ? Irregular heartbeat.  Bladder problems, such as: ? Trouble starting or stopping urination. ? Leaking urine. ? Trouble emptying the bladder. ? Urinary tract infections (UTIs).  Problems with other body functions, such as: ? Sweat. You may sweat too much or too little. ? Temperature. You might get hot easily. Or, you might feel cold more than usual. ? Sexual function. Men may not be able to get or maintain an erection. Women may have vaginal dryness and difficulty with arousal. Focal neuropathy Symptoms affect only one area of the body. Common symptoms include:  Numbness.  Tingling.  Burning pain.  Prickling feeling.  Very sensitive skin.  Weakness.  Inability to move (paralysis).  Muscle twitching.  Muscles getting smaller (wasting).  Poor coordination.  Double or blurred vision. Proximal   neuropathy  Sudden, severe pain in the hip, thigh, or buttocks. Pain may spread from the back into the legs (sciatica).  Pain and numbness in the arms and legs.   Tingling.  Loss of bladder control or bowel control.  Weakness and wasting of thigh muscles.  Difficulty getting up from a seated position.  Abdominal swelling.  Unexplained weight loss. How is this diagnosed? Diagnosis usually involves reviewing your medical history and any symptoms you have. Diagnosis varies depending on the type of neuropathy your health care provider suspects. Peripheral neuropathy Your health care provider will check areas that are affected by your nervous system (neurologic exam), such as your reflexes, how you move, and what you can feel. You may have other tests, such as:  Blood tests.  Removal and examination of fluid that surrounds the spinal cord (lumbar puncture).  CT scan.  MRI.  A test to check the nerves that control muscles (electromyogram, EMG).  Tests of how quickly messages pass through your nerves (nerve conduction velocity tests).  Removal of a small piece of nerve to be examined under a microscope (biopsy). Autonomic neuropathy You may have tests, such as:  Tests to measure your blood pressure and heart rate. This may include monitoring you while you are safely secured to an exam table that moves you from a lying position to an upright position (table tilt test).  Breathing tests to check your lungs.  Tests to check how food moves through the digestive system (gastric emptying tests).  Blood, sweat, or urine tests.  Ultrasound of your bladder.  Spinal fluid tests. Focal neuropathy This condition may be diagnosed with:  A neurologic exam.  CT scan.  MRI.  EMG.  Nerve conduction velocity tests. Proximal neuropathy There is no test to diagnose this type of neuropathy. You may have tests to rule out other possible causes of this type of neuropathy. Tests may include:  X-rays of your spine and lumbar region.  Lumbar puncture.  MRI. How is this treated? The goal of treatment is to keep nerve damage from getting worse.  The most important part of treatment is keeping your blood glucose level and your A1C level within your target range by following your diabetes management plan. Over time, maintaining lower blood glucose levels helps lessen symptoms. In some cases, you may need prescription pain medicine. Follow these instructions at home:  Lifestyle   Do not use any products that contain nicotine or tobacco, such as cigarettes and e-cigarettes. If you need help quitting, ask your health care provider.  Be physically active every day. Include strength training and balance exercises.  Follow a healthy meal plan.  Work with your health care provider to manage your blood pressure. General instructions  Follow your diabetes management plan as directed. ? Check your blood glucose levels as directed by your health care provider. ? Keep your blood glucose in your target range as directed by your health care provider. ? Have your A1C level checked at least two times a year, or as often as told by your health care provider.  Take over the counter and prescription medicines only as told by your health care provider. This includes insulin and diabetes medicine.  Do not drive or use heavy machinery while taking prescription pain medicines.  Check your skin and feet every day for cuts, bruises, redness, blisters, or sores.  Keep all follow up visits as told by your health care provider. This is important. Contact a health care provider if:    You have burning, stabbing, or aching pain in your legs or feet.  You are unable to feel pressure or pain in your feet.  You develop problems with digestion, such as: ? Nausea. ? Vomiting. ? Bloating. ? Constipation. ? Diarrhea. ? Abdominal pain.  You have difficulty with urination, such as inability: ? To control when you urinate (incontinence). ? To completely empty the bladder (retention).  You have palpitations.  You feel dizzy, weak, or faint when you stand  up. Get help right away if:  You cannot urinate.  You have sudden weakness or loss of coordination.  You have trouble speaking.  You have pain or pressure in your chest.  You have an irregular heart beat.  You have sudden inability to move a part of your body. Summary  Diabetic neuropathy refers to nerve damage that is caused by diabetes. It can affect nerves throughout the entire body, causing numbness and pain in the arms, legs, digestive tract, heart, and other body systems.  Keep your blood glucose level and your blood pressure in your target range, as directed by your health care provider. This can help prevent neuropathy from getting worse.  Check your skin and feet every day for cuts, bruises, redness, blisters, or sores.  Do not use any products that contain nicotine or tobacco, such as cigarettes and e-cigarettes. If you need help quitting, ask your health care provider. This information is not intended to replace advice given to you by your health care provider. Make sure you discuss any questions you have with your health care provider. Document Released: 02/06/2002 Document Revised: 01/10/2018 Document Reviewed: 01/02/2017 Elsevier Patient Education  2020 [employer]  Onychomycosis/Fungal Toenails  WHAT IS IT? An infection that lies within the keratin of your nail plate that is caused by a fungus.  WHY ME? Fungal infections affect all ages, sexes, races, and creeds.  There may be many factors that predispose you to a fungal infection such as age, coexisting medical conditions such as diabetes, or an autoimmune disease; stress, medications, fatigue, genetics, etc.  Bottom line: fungus thrives in a warm, moist environment and your shoes offer such a location.  IS IT CONTAGIOUS? Theoretically, yes.  You do not want to share shoes, nail clippers or files with someone who has fungal toenails.  Walking around barefoot in the same room or sleeping in the same bed is unlikely  to transfer the organism.  It is important to realize, however, that fungus can spread easily from one nail to the next on the same foot.  HOW DO WE TREAT THIS?  There are several ways to treat this condition.  Treatment may depend on many factors such as age, medications, pregnancy, liver and kidney conditions, etc.  It is best to ask your doctor which options are available to you.  1. No treatment.   Unlike many other medical concerns, you can live with this condition.  However for many people this can be a painful condition and may lead to ingrown toenails or a bacterial infection.  It is recommended that you keep the nails cut short to help reduce the amount of fungal nail. 2. Topical treatment.  These range from herbal remedies to prescription strength nail lacquers.  About 40-50% effective, topicals require twice daily application for approximately 9 to 12 months or until an entirely new nail has grown out.  The most effective topicals are medical grade medications available through physicians offices. 3. Oral antifungal medications.  With an 80-90% cure   rate, the most common oral medication requires 3 to 4 months of therapy and stays in your system for a year as the new nail grows out.  Oral antifungal medications do require blood work to make sure it is a safe drug for you.  A liver function panel will be performed prior to starting the medication and after the first month of treatment.  It is important to have the blood work performed to avoid any harmful side effects.  In general, this medication safe but blood work is required. 4. Laser Therapy.  This treatment is performed by applying a specialized laser to the affected nail plate.  This therapy is noninvasive, fast, and non-painful.  It is not covered by insurance and is therefore, out of pocket.  The results have been very good with a 80-95% cure rate.  The [facility] is the only practice in the area to offer this therapy. 5. Permanent  Nail Avulsion.  Removing the entire nail so that a new nail will not grow back. 

## 2019-09-05 ENCOUNTER — Telehealth: Payer: Self-pay | Admitting: *Deleted

## 2019-09-05 NOTE — Telephone Encounter (Signed)
Received Therapeutic Footwear Form From [facility]-Dr. Elisha Ponder. To be signed and faxed back to [phone] Placed in Dr. Cyndi Lennert folder to review and sign.

## 2019-09-10 ENCOUNTER — Other Ambulatory Visit: Payer: Self-pay | Admitting: Internal Medicine

## 2019-09-10 DIAGNOSIS — E1121 Type 2 diabetes mellitus with diabetic nephropathy: Secondary | ICD-10-CM

## 2019-09-10 NOTE — Progress Notes (Signed)
Subjective: Dawn [name] is seen today for follow up painful, elongated, thickened toenails 1-5 b/l feet that she cannot cut. Pain interferes with daily activities. Aggravating factor includes wearing enclosed shoe gear and relieved with periodic debridement.  Patient is requesting diabetic shoes on today's visit.  Current Outpatient Medications on File Prior to Visit  Medication Sig  . aspirin 81 MG tablet Take 81 mg by mouth daily.  . calcium-vitamin D (OSCAL WITH D) 500-200 MG-UNIT per tablet Take 1 tablet by mouth daily.  . cholecalciferol (VITAMIN D) 1000 UNITS tablet Take 1,000 Units by mouth daily.  Marland Kitchen desoximetasone (TOPICORT) 0.25 % cream Apply 1 application topically 2 (two) times daily.  Marland Kitchen gabapentin (NEURONTIN) 300 MG capsule Take one tablet by mouth in the morning and one tablet by mouth in the evening for pains  . glipiZIDE (GLUCOTROL) 5 MG tablet Take 1 tablet (5 mg total) by mouth daily.  Marland Kitchen glucose blood (FREESTYLE LITE) test strip Use to test blood sugar once daily. Dx: E11.21  . Lancets (FREESTYLE) lancets Use to test blood sugar. Dx: E11.21  . linagliptin (TRADJENTA) 5 MG TABS tablet Take 1 tablet (5 mg total) by mouth daily.  Marland Kitchen lisinopril-hydrochlorothiazide (PRINZIDE,ZESTORETIC) 20-12.5 MG tablet Take one tablet by mouth once daily to control blood pressure  . oxyCODONE-acetaminophen (ROXICET) 5-325 MG tablet Take 1 tablet by mouth every 8 (eight) hours as needed for severe pain.  . pravastatin (PRAVACHOL) 20 MG tablet Take one tablet with evening meal.  . vitamin E 400 UNIT capsule Take 400 Units by mouth daily.   No current facility-administered medications on file prior to visit.      Allergies  Allergen Reactions  . Keflex [Cephalexin]   . Metronidazole   . Simvastatin     Leg cramsps     Objective:  Vascular Examination: Capillary refill time <3 seconds x 10 digits.  Dorsalis pedis and Posterior tibial pulses 1/4 b/l.  Digital hair absent b/l.  Skin  temperature gradient WNL b/l.   Dermatological Examination: Skin with normal turgor, texture and tone b/l.  Toenails 1-5 b/l discolored, thick, dystrophic with subungual debris and pain with palpation to nailbeds due to thickness of nails.  Hyperkeratotic lesions dorsal PIPJ right 3rd and 5th digits with tenderness to palpation. No edema, no erythema, no drainage, no flocculence.  Musculoskeletal: Muscle strength 5/5 to all LE muscle groups b/l.  Hammertoes 2-5 b/l.  Pes planus foot type b/l.  No pain, crepitus or joint limitation noted with ROM.   Neurological Examination: Protective sensation diminished  bilaterally.  Vibratory sensation diminished bilaterally.   Assessment: Painful onychomycosis toenails 1-5 b/l  Corns right 3rd and 5th digits Hammertoes 2-5 b/l NIDDM with neuropathy  Plan: 1. Toenails 1-5 b/l were debrided in length and girth without iatrogenic bleeding. 2. Corn(s) pared right 3rd, 5th digits utilizing sterile scalpel blade without incident. 3. Patient to continue soft, supportive shoe gear daily. Start diabetic shoe process on today.  4. Patient to report any pedal injuries to medical professional immediately. 5. Follow up 3 months.  6. Patient/POA to call should there be a concern in the interim.

## 2019-09-10 NOTE — Telephone Encounter (Signed)
Routing pravastatin to provider for further review high allergy / contraindication indicated.

## 2019-09-26 ENCOUNTER — Ambulatory Visit (INDEPENDENT_AMBULATORY_CARE_PROVIDER_SITE_OTHER): Payer: Medicare Other | Admitting: Internal Medicine

## 2019-09-26 ENCOUNTER — Other Ambulatory Visit: Payer: Self-pay

## 2019-09-26 ENCOUNTER — Encounter: Payer: Self-pay | Admitting: Internal Medicine

## 2019-09-26 VITALS — BP 128/74 | HR 74 | Temp 97.8°F | Ht 65.0 in | Wt 168.0 lb

## 2019-09-26 DIAGNOSIS — S90821A Blister (nonthermal), right foot, initial encounter: Secondary | ICD-10-CM

## 2019-09-26 DIAGNOSIS — M48062 Spinal stenosis, lumbar region with neurogenic claudication: Secondary | ICD-10-CM | POA: Diagnosis not present

## 2019-09-26 DIAGNOSIS — N184 Chronic kidney disease, stage 4 (severe): Secondary | ICD-10-CM

## 2019-09-26 DIAGNOSIS — E1122 Type 2 diabetes mellitus with diabetic chronic kidney disease: Secondary | ICD-10-CM | POA: Diagnosis not present

## 2019-09-26 MED ORDER — OXYCODONE-ACETAMINOPHEN 5-325 MG PO TABS: 1.0000 | ORAL_TABLET | Freq: Three times a day (TID) | ORAL | 0 refills | Status: DC | PRN

## 2019-09-26 NOTE — Patient Instructions (Addendum)
Cleanse your foot daily with antibacterial soap daily. Apply triple antibiotic, neosporin or polysporin to the open area. Then cover with a dry, nonadherent dressing like telfa which you can get at the pharmacy. Wrap to keep it in place. Watch out for redness, warmth, drainage or swelling of the foot and, certainly, for fever.  Let us know right away if any of that happens

## 2019-09-26 NOTE — Progress Notes (Signed)
Location:  [facility] clinic Provider: Nattalie Santiesteban L. Mariea Clonts, D.O., C.M.D.  Goals of Care:  Advanced Directives 09/26/2019  Does Patient Have a Medical Advance Directive? No  Type of Advance Directive -  Does patient want to make changes to medical advance directive? -  Copy of [place] in Chart? -  Would patient like information on creating a medical advance directive? Yes (ED - Information included in AVS)     Chief Complaint  Patient presents with  . Acute Visit    Wound top of right foot x3 days, denies injury    HPI: Patient is a 85 y.o. female seen today for an acute visit for a ruptured blister on top of her right foot.  She noticed the blister on 3 days ago, then popped it herself, has been cleaning it with peroxide.  It's sore and it frightened her.  There's no redness, warmth, drainage.  She's just been putting a bandaid over it.  She wears stockings and mary jane style diabetic shoes.  Her diabetes has been well-controlled.    Past Medical History:  Diagnosis Date  . Anemia, unspecified   . Anorexia   . Chronic kidney disease   . Chronic kidney disease, stage III (moderate)   . Cystocele, midline   . Disorder of bone and cartilage, unspecified   . Kyphosis associated with other condition   . Obesity, unspecified   . Osteoarthrosis, unspecified whether generalized or localized, unspecified site   . Other and unspecified hyperlipidemia   . Other hammer toe (acquired)   . Postmenopausal atrophic vaginitis   . PVC's (premature ventricular contractions) 01/29/2015  . RBBB 01/29/2015  . Reflux esophagitis   . Type II or unspecified type diabetes mellitus without mention of complication, not stated as uncontrolled   . Unspecified constipation   . Unspecified disorder of kidney and ureter   . Unspecified essential hypertension   . Unspecified hereditary and idiopathic peripheral neuropathy   . Unspecified urinary incontinence     Past Surgical History:    Procedure Laterality Date  . BLADDER SURGERY     tie up in 2013  . EYE SURGERY  11/2016   Bilateral Cataract Sx  . TUBAL LIGATION      Allergies  Allergen Reactions  . Keflex [Cephalexin]   . Metronidazole   . Simvastatin     Leg cramsps    Outpatient Encounter Medications as of 09/26/2019  Medication Sig  . aspirin 81 MG tablet Take 81 mg by mouth daily.  . calcium-vitamin D (OSCAL WITH D) 500-200 MG-UNIT per tablet Take 1 tablet by mouth daily.  . cholecalciferol (VITAMIN D) 1000 UNITS tablet Take 1,000 Units by mouth daily.  Marland Kitchen desoximetasone (TOPICORT) 0.25 % cream Apply 1 application topically 2 (two) times daily.  Marland Kitchen gabapentin (NEURONTIN) 300 MG capsule Take one tablet by mouth in the morning and one tablet by mouth in the evening for pains  . glipiZIDE (GLUCOTROL) 5 MG tablet TAKE 1 TABLET DAILY  . glucose blood (FREESTYLE LITE) test strip Use to test blood sugar once daily. Dx: E11.21  . Lancets (FREESTYLE) lancets Use to test blood sugar. Dx: E11.21  . lisinopril-hydrochlorothiazide (PRINZIDE,ZESTORETIC) 20-12.5 MG tablet Take one tablet by mouth once daily to control blood pressure  . oxyCODONE-acetaminophen (ROXICET) 5-325 MG tablet Take 1 tablet by mouth every 8 (eight) hours as needed for severe pain.  . pravastatin (PRAVACHOL) 20 MG tablet TAKE 1 TABLET WITH EVENING MEAL  . TRADJENTA 5  MG TABS tablet TAKE 1 TABLET DAILY  . vitamin E 400 UNIT capsule Take 400 Units by mouth daily.   No facility-administered encounter medications on file as of 09/26/2019.     Review of Systems:  Review of Systems  Constitutional: Negative for chills and fever.  Cardiovascular: Negative for chest pain and palpitations.  Musculoskeletal: Positive for back pain. Negative for falls.  Skin:       Blister top of right foot above middle toes  Neurological: Positive for tingling and speech change.    Health Maintenance  Topic Date Due  . FOOT EXAM  11/27/2019  . OPHTHALMOLOGY EXAM   12/04/2019  . HEMOGLOBIN A1C  02/01/2020  . TETANUS/TDAP  10/01/2026  . INFLUENZA VACCINE  Completed  . DEXA SCAN  Completed  . PNA vac Low Risk Adult  Completed    Physical Exam: Vitals:   09/26/19 1542  BP: 128/74  Pulse: 74  Temp: 97.8 F (36.6 C)  TempSrc: Oral  SpO2: 97%  Weight: 168 lb (76.2 kg)  Height: 5\' 5"  (1.651 m)   Body mass index is 27.96 kg/m. Physical Exam Vitals signs reviewed.  Constitutional:      General: She is not in acute distress.    Appearance: Normal appearance. She is not ill-appearing or toxic-appearing.  HENT:     Head: Normocephalic and atraumatic.  Musculoskeletal: Normal range of motion.     Comments: Walks with walker  Skin:    Comments: Skin of feet is dry; ruptured blister with unapproximated skin on dorsal aspect of right foot proximal to 2nd and 3rd toe; no surrounding erythema, warmth or drainage, dorsalis pedis pulse is 1+, feet are both warm  Neurological:     General: No focal deficit present.     Mental Status: She is alert and oriented to person, place, and time.  Psychiatric:        Mood and Affect: Mood normal.     Labs reviewed: Basic Metabolic Panel: Recent Labs    11/26/18 1227 03/29/19 0843 08/01/19 0836  NA 142 142 142  K 4.3 4.1 4.2  CL 105 106 106  CO2 26 26 26   GLUCOSE 53* 114* 110*  BUN 32* 28* 38*  CREATININE 1.63* 1.66* 1.59*  CALCIUM 9.8 9.8 9.2   Liver Function Tests: Recent Labs    11/21/18 0917 03/29/19 0843 08/01/19 0836  AST 22 18 19   ALT 20 12 14   BILITOT 0.5 0.5 0.5  PROT 6.7 7.0 6.6   No results for input(s): LIPASE, AMYLASE in the last 8760 hours. No results for input(s): AMMONIA in the last 8760 hours. CBC: Recent Labs    11/21/18 0917 03/29/19 0843 08/01/19 0836  WBC 10.1 7.9 7.7  NEUTROABS 6,636 4,764 4,112  HGB 10.8* 11.2* 10.9*  HCT 34.4* 34.7* 34.9*  MCV 86.2 87.2 87.7  PLT 199 162 150   Lipid Panel: Recent Labs    11/26/18 1227 08/01/19 0836  CHOL 147 161   HDL 39* 49*  LDLCALC 81 95  TRIG 162* 83  CHOLHDL 3.8 3.3   Lab Results  Component Value Date   HGBA1C 6.3 (H) 08/01/2019    Assessment/Plan 1. Blister (nonthermal), right foot, initial encounter -she popped her own blister -has been cleaning with peroxide -advised not to pop her own blisters being diabetic -Cleanse your foot daily with antibacterial soap daily. Apply triple antibiotic, neosporin or polysporin to the open area. Then cover with a dry, nonadherent dressing like telfa which you can  get at the pharmacy. Wrap to keep it in place. Watch out for redness, warmth, drainage or swelling of the foot and, certainly, for fever.  Let us know right away if any of that happens  2. Spinal stenosis, lumbar region, with neurogenic claudication -requested her medication to be renewed for her chronic back pain - oxyCODONE-acetaminophen (ROXICET) 5-325 MG tablet; Take 1 tablet by mouth every 8 (eight) hours as needed for severe pain.  Dispense: 90 tablet; Refill: 0  3. Type 2 diabetes mellitus with stage 4 chronic kidney disease, without long-term current use of insulin (HCC) -has been well controlled -was scared due to having this wound on her foot, but appears it will heal well -she has good pulses in her feet  Labs/tests ordered:  No new Next appt:  F/u one week   Tiawana Forgy L. Christpoher Sievers, D.O. [facility] [address], [place] [address] Cell Phone (Mon-Fri 8am-5pm):  [phone] On Call:  [phone] & follow prompts after 5pm & weekends Office Phone:  [phone] Office Fax:  [phone]

## 2019-10-01 ENCOUNTER — Other Ambulatory Visit: Payer: Self-pay | Admitting: Internal Medicine

## 2019-10-03 ENCOUNTER — Encounter: Payer: Self-pay | Admitting: Family

## 2019-10-03 ENCOUNTER — Other Ambulatory Visit: Payer: Self-pay

## 2019-10-03 ENCOUNTER — Ambulatory Visit (INDEPENDENT_AMBULATORY_CARE_PROVIDER_SITE_OTHER): Payer: Medicare Other | Admitting: Family

## 2019-10-03 VITALS — BP 124/70 | HR 88 | Temp 97.5°F | Ht 65.0 in | Wt 164.0 lb

## 2019-10-03 DIAGNOSIS — L089 Local infection of the skin and subcutaneous tissue, unspecified: Secondary | ICD-10-CM

## 2019-10-03 DIAGNOSIS — L03818 Cellulitis of other sites: Secondary | ICD-10-CM

## 2019-10-03 DIAGNOSIS — S90821D Blister (nonthermal), right foot, subsequent encounter: Secondary | ICD-10-CM | POA: Diagnosis not present

## 2019-10-03 MED ORDER — SACCHAROMYCES BOULARDII 250 MG PO CAPS: 250.0000 mg | ORAL_CAPSULE | Freq: Two times a day (BID) | ORAL | 0 refills | Status: AC

## 2019-10-03 MED ORDER — DOXYCYCLINE HYCLATE 100 MG PO TABS: 100.0000 mg | ORAL_TABLET | Freq: Two times a day (BID) | ORAL | 0 refills | Status: AC

## 2019-10-03 NOTE — Patient Instructions (Signed)
1.Cleanse right top of the foot with normal saline, pat dry,apply triple antibiotic ointment to wound bed and cover with bandage.   2. Notify provider if running any fever,or having any pain, drainge from wound or worsening symptoms.  3. Take doxycycline 100 mg tablet one by mouth twice daily  For seven days   4. Take Probiotic ( Florastor or other probiotic ) one by mouth twice daily for 10 days to prevent antibiotic associated diarrhea.

## 2019-10-03 NOTE — Progress Notes (Signed)
Provider: Orrie Lascano FNP-C  Gayland Curry, DO  Patient Care Team: Gayland Curry, DO as PCP - General (Geriatric Medicine) Clent Jacks, MD as Consulting Physician (Ophthalmology)  Extended Emergency Contact Information Primary Emergency Contact: Rutigliano-Johnson,Lori Address: [place]          [name] Phone: [phone] Mobile Phone: [phone] Relation: None  Code Status: Full Code  Goals of care: Advanced Directive information Advanced Directives 09/26/2019  Does Patient Have a Medical Advance Directive? No  Type of Advance Directive -  Does patient want to make changes to medical advance directive? -  Copy of [place] in Chart? -  Would patient like information on creating a medical advance directive? Yes (ED - Information included in AVS)     Chief Complaint  Patient presents with  . Follow-up    1 week follow up for ruptured blister on right foot patient states foot is still not healing and is the same  . Medication Management    Topicort cream patient states she has not used and was confused on if provider wanted her to use     HPI:  Pt is a 85 y.o. female seen today at [facility] office for an acute visit for 1 week follow up for ruptured blister on right foot.she states foot is still not healed and is about the same.she has been cleaning with hydrogen,applying triple antibiotic ointment and covering with bandage as directed by MD on 09/26/2019.she states noticed small amount on the bandage today.Also has some redness around the blister area.she denies any pain on wound though states has chronic tingling on her foot.she denies any fever or chills.  She would like to know whether she can apply Topicort cream patient states she has not used and was confused on if provider wanted her to use.Topicort was prescribed by dermatology. I've advised her not to apply Topicort on the wound.   She states her  CBG's readings are under controled  in the 110's.   Past Medical History:  Diagnosis Date  . Anemia, unspecified   . Anorexia   . Chronic kidney disease   . Chronic kidney disease, stage III (moderate)   . Cystocele, midline   . Disorder of bone and cartilage, unspecified   . Kyphosis associated with other condition   . Obesity, unspecified   . Osteoarthrosis, unspecified whether generalized or localized, unspecified site   . Other and unspecified hyperlipidemia   . Other hammer toe (acquired)   . Postmenopausal atrophic vaginitis   . PVC's (premature ventricular contractions) 01/29/2015  . RBBB 01/29/2015  . Reflux esophagitis   . Type II or unspecified type diabetes mellitus without mention of complication, not stated as uncontrolled   . Unspecified constipation   . Unspecified disorder of kidney and ureter   . Unspecified essential hypertension   . Unspecified hereditary and idiopathic peripheral neuropathy   . Unspecified urinary incontinence    Past Surgical History:  Procedure Laterality Date  . BLADDER SURGERY     tie up in 2013  . EYE SURGERY  11/2016   Bilateral Cataract Sx  . TUBAL LIGATION      Allergies  Allergen Reactions  . Keflex [Cephalexin]   . Metronidazole   . Simvastatin     Leg cramsps    Outpatient Encounter Medications as of 10/03/2019  Medication Sig  . aspirin 81 MG tablet Take 81 mg by mouth daily.  . calcium-vitamin D (OSCAL  WITH D) 500-200 MG-UNIT per tablet Take 1 tablet by mouth daily.  . cholecalciferol (VITAMIN D) 1000 UNITS tablet Take 1,000 Units by mouth daily.  Marland Kitchen desoximetasone (TOPICORT) 0.25 % cream Apply 1 application topically 2 (two) times daily.  Marland Kitchen gabapentin (NEURONTIN) 300 MG capsule TAKE 1 CAPSULE IN THE MORNING AND TAKE 1 CAPSULE IN THE EVENING FOR PAINS  . glipiZIDE (GLUCOTROL) 5 MG tablet TAKE 1 TABLET DAILY  . glucose blood (FREESTYLE LITE) test strip Use to test blood sugar once daily. Dx: E11.21  . Lancets  (FREESTYLE) lancets Use to test blood sugar. Dx: E11.21  . lisinopril-hydrochlorothiazide (PRINZIDE,ZESTORETIC) 20-12.5 MG tablet Take one tablet by mouth once daily to control blood pressure  . oxyCODONE-acetaminophen (ROXICET) 5-325 MG tablet Take 1 tablet by mouth every 8 (eight) hours as needed for severe pain.  . pravastatin (PRAVACHOL) 20 MG tablet TAKE 1 TABLET WITH EVENING MEAL  . TRADJENTA 5 MG TABS tablet TAKE 1 TABLET DAILY  . vitamin E 400 UNIT capsule Take 400 Units by mouth daily.   No facility-administered encounter medications on file as of 10/03/2019.     Review of Systems  Constitutional: Negative for appetite change, chills, fatigue and fever.  Respiratory: Negative for cough, chest tightness, shortness of breath and wheezing.   Cardiovascular: Negative for chest pain, palpitations and leg swelling.  Musculoskeletal: Positive for gait problem.  Skin: Negative for color change, pallor and rash.       Open blister area x 1 week has had small amount of drainage with some redness around the wound.   Neurological: Negative for dizziness, weakness, light-headedness and headaches.       Tingling on feet   Psychiatric/Behavioral: Negative for agitation and sleep disturbance. The patient is not nervous/anxious.     Immunization History  Administered Date(s) Administered  . Fluad Quad(high Dose 65+) 08/08/2019  . Influenza, High Dose Seasonal PF 09/11/2018  . Influenza,inj,Quad PF,6+ Mos 10/08/2013, 09/30/2014  . Influenza-Unspecified 10/17/2012, 09/12/2015, 10/01/2016, 09/23/2017  . Pneumococcal Conjugate-13 10/31/1997, 07/29/2014  . Pneumococcal Polysaccharide-23 07/30/2015  . Tdap 10/31/1997, 03/12/2014, 10/01/2016  . Zoster 07/30/2014   Pertinent  Health Maintenance Due  Topic Date Due  . FOOT EXAM  11/27/2019  . OPHTHALMOLOGY EXAM  12/04/2019  . HEMOGLOBIN A1C  02/01/2020  . INFLUENZA VACCINE  Completed  . DEXA SCAN  Completed  . PNA vac Low Risk Adult  Completed    Fall Risk  10/03/2019 09/26/2019 08/08/2019 05/08/2019 04/04/2019  Falls in the past year? 0 0 0 0 0  Number falls in past yr: 0 - 0 0 0  Injury with Fall? 0 - 0 0 0  Comment - - - - -  Risk for fall due to : - - - - -  Follow up - - - - -    Vitals:   10/03/19 1536  BP: 124/70  Pulse: 88  Temp: (!) 97.5 F (36.4 C)  TempSrc: Temporal  SpO2: 97%  Weight: 164 lb (74.4 kg)  Height: 5\' 5"  (1.651 m)   Body mass index is 27.29 kg/m. Physical Exam Vitals signs reviewed.  Constitutional:      General: She is not in acute distress.    Appearance: She is overweight. She is not ill-appearing.  HENT:     Mouth/Throat:     Mouth: Mucous membranes are moist.     Pharynx: Oropharynx is clear. No oropharyngeal exudate or posterior oropharyngeal erythema.  Eyes:     General: No scleral icterus.  Right eye: No discharge.        Left eye: No discharge.     Extraocular Movements: Extraocular movements intact.     Conjunctiva/sclera: Conjunctivae normal.     Pupils: Pupils are equal, round, and reactive to light.     Comments: Corrective lens in place   Cardiovascular:     Rate and Rhythm: Normal rate and regular rhythm.     Pulses: Normal pulses.     Heart sounds: Normal heart sounds. No murmur. No friction rub. No gallop.   Pulmonary:     Effort: Pulmonary effort is normal. No respiratory distress.     Breath sounds: Normal breath sounds. No wheezing, rhonchi or rales.  Chest:     Chest wall: No tenderness.  Abdominal:     General: Bowel sounds are normal. There is no distension.     Palpations: Abdomen is soft. There is no mass.     Tenderness: There is no abdominal tenderness. There is no right CVA tenderness, left CVA tenderness, guarding or rebound.  Musculoskeletal:        General: No tenderness.     Right lower leg: Edema present.     Left lower leg: Edema present.     Comments: Unsteady gait ambulates with walker.bilateral lower extremities trace edema.   Skin:     General: Skin is warm and dry.     Coloration: Skin is not pale.     Findings: Erythema present. No bruising or rash.     Comments: Right dorsal foot 1.5 X 1 cm previous blister area has 20 % open wound on 10 O'clock to 1 O'clock position .wound bed red without any drainage noted.Surrounding skin tissue erythema warm to touch.Non-tender to touch. Wound cleansed with saline,pat dry,TAB applied to wound bed and covered with foam dressing.she tolerated procedure well.   Neurological:     Mental Status: Mental status is at baseline.     Cranial Nerves: No cranial nerve deficit.     Sensory: No sensory deficit.     Motor: No weakness.     Gait: Gait abnormal.  Psychiatric:        Mood and Affect: Mood normal.        Behavior: Behavior normal.        Thought Content: Thought content normal.        Judgment: Judgment normal.    Labs reviewed: Recent Labs    11/26/18 1227 03/29/19 0843 08/01/19 0836  NA 142 142 142  K 4.3 4.1 4.2  CL 105 106 106  CO2 26 26 26   GLUCOSE 53* 114* 110*  BUN 32* 28* 38*  CREATININE 1.63* 1.66* 1.59*  CALCIUM 9.8 9.8 9.2   Recent Labs    11/21/18 0917 03/29/19 0843 08/01/19 0836  AST 22 18 19   ALT 20 12 14   BILITOT 0.5 0.5 0.5  PROT 6.7 7.0 6.6   Recent Labs    11/21/18 0917 03/29/19 0843 08/01/19 0836  WBC 10.1 7.9 7.7  NEUTROABS 6,636 4,764 4,112  HGB 10.8* 11.2* 10.9*  HCT 34.4* 34.7* 34.9*  MCV 86.2 87.2 87.7  PLT 199 162 150   Lab Results  Component Value Date   TSH 2.160 07/29/2014   Lab Results  Component Value Date   HGBA1C 6.3 (H) 08/01/2019   Lab Results  Component Value Date   CHOL 161 08/01/2019   HDL 49 (L) 08/01/2019   LDLCALC 95 08/01/2019   TRIG 83 08/01/2019   CHOLHDL 3.3 08/01/2019  Significant Diagnostic Results in last 30 days:  No results found.  Assessment/Plan 1. Cellulitis of other specified site Afebrile.Surrounding skin on right dorsal foot wound erythema warm to touch but non-tender.   -  doxycycline (VIBRA-TABS) 100 MG tablet; Take 1 tablet (100 mg total) by mouth 2 (two) times daily for 7 days.  Dispense: 14 tablet; Refill: 0 - saccharomyces boulardii (FLORASTOR) 250 MG capsule; Take 1 capsule (250 mg total) by mouth 2 (two) times daily for 10 days.  Dispense: 20 capsule; Refill: 0  2. Infected blister of right foot, subsequent encounter Surrounding skin tissue on right dorsal foot erythema with small amount of drainage.Wound cleansed with saline,pat dry,TAB applied to wound bed and covered with foam dressing.she tolerated procedure well.encouraged to continue with current dressing changed. - doxycycline (VIBRA-TABS) 100 MG tablet; Take 1 tablet (100 mg total) by mouth 2 (two) times daily for 7 days.  Dispense: 14 tablet; Refill: 0 - saccharomyces boulardii (FLORASTOR) 250 MG capsule; Take 1 capsule (250 mg total) by mouth 2 (two) times daily for 10 days.  Dispense: 20 capsule; Refill: 0 - F/u in 2 weeks to evaluate wound.  Family/ staff Communication: Reviewed plan of care with patient   Labs/tests ordered: None   Avri Paiva C Alexius Hangartner, NP

## 2019-10-14 ENCOUNTER — Ambulatory Visit (INDEPENDENT_AMBULATORY_CARE_PROVIDER_SITE_OTHER): Payer: Medicare Other | Admitting: Family

## 2019-10-14 ENCOUNTER — Encounter: Payer: Self-pay | Admitting: Family

## 2019-10-14 ENCOUNTER — Other Ambulatory Visit: Payer: Self-pay

## 2019-10-14 VITALS — BP 98/80 | HR 87 | Temp 96.9°F | Ht 65.0 in | Wt 164.4 lb

## 2019-10-14 DIAGNOSIS — S90821D Blister (nonthermal), right foot, subsequent encounter: Secondary | ICD-10-CM

## 2019-10-14 NOTE — Patient Instructions (Signed)
1. Clean right foot wound with antibacterial dial soap and water water daily,rinse and pat dry.cover with non-adhesive gauze and secure with paper tape.  2. Do not use any Peroxide or triple antibiotic ointment.  3. Follow up in 1 week for evaluation,Notify provider's office if you notice any drainage or worsening blisters,running any fever or chills.

## 2019-10-14 NOTE — Progress Notes (Signed)
Provider:   FNP-C  Gayland Curry, DO  Patient Care Team: Gayland Curry, DO as PCP - General (Geriatric Medicine) Clent Jacks, MD as Consulting Physician (Ophthalmology)  Extended Emergency Contact Information Primary Emergency Contact: Lepak-Johnson,Lori Address: [place]          [place] of [place] Phone: [phone] Mobile Phone: [phone] Relation: None  Code Status: Full Code  Goals of care: Advanced Directive information Advanced Directives 09/26/2019  Does Patient Have a Medical Advance Directive? No  Type of Advance Directive -  Does patient want to make changes to medical advance directive? -  Copy of [place] in Chart? -  Would patient like information on creating a medical advance directive? Yes (ED - Information included in AVS)     Chief Complaint  Patient presents with  . Acute Visit    Wound on right foot not healing patient states she has used triple antibiotic patient states is spreading but denies any pain . Patient states she has had wound for about 3 weeks   . Medical Management of Chronic Issues    patient states she completed all but 2 pills from florastor and completed course of her antiobotic     HPI:  Pt is a 85 y.o. female seen today at for an acute visit for evaluation of right foot wound.she states wound still not resolved.she has been cleansing wound with peroxide and applies triple antibiotic ointment and covers with gauze.she has completed a course of doxycycline. Has 2 capsule for probiotics.she denies any fever,chills or drainage from wound.she has had new blisters along right great toe and second toe but seems to have gone down when she wore her shoes today.she also has multiple small none painful blister spreading on top of the foot.   Past Medical History:  Diagnosis Date  . Anemia, unspecified   . Anorexia   . Chronic kidney disease   . Chronic kidney  disease, stage III (moderate)   . Cystocele, midline   . Disorder of bone and cartilage, unspecified   . Kyphosis associated with other condition   . Obesity, unspecified   . Osteoarthrosis, unspecified whether generalized or localized, unspecified site   . Other and unspecified hyperlipidemia   . Other hammer toe (acquired)   . Postmenopausal atrophic vaginitis   . PVC's (premature ventricular contractions) 01/29/2015  . RBBB 01/29/2015  . Reflux esophagitis   . Type II or unspecified type diabetes mellitus without mention of complication, not stated as uncontrolled   . Unspecified constipation   . Unspecified disorder of kidney and ureter   . Unspecified essential hypertension   . Unspecified hereditary and idiopathic peripheral neuropathy   . Unspecified urinary incontinence    Past Surgical History:  Procedure Laterality Date  . BLADDER SURGERY     tie up in 2013  . EYE SURGERY  11/2016   Bilateral Cataract Sx  . TUBAL LIGATION      Allergies  Allergen Reactions  . Keflex [Cephalexin]   . Metronidazole   . Simvastatin     Leg cramsps    Outpatient Encounter Medications as of 10/14/2019  Medication Sig  . aspirin 81 MG tablet Take 81 mg by mouth daily.  . calcium-vitamin D (OSCAL WITH D) 500-200 MG-UNIT per tablet Take 1 tablet by mouth daily.  . cholecalciferol (VITAMIN D) 1000 UNITS tablet Take 1,000 Units by mouth daily.  Marland Kitchen desoximetasone (TOPICORT) 0.25 % cream Apply 1 application topically  2 (two) times daily.  Marland Kitchen gabapentin (NEURONTIN) 300 MG capsule TAKE 1 CAPSULE IN THE MORNING AND TAKE 1 CAPSULE IN THE EVENING FOR PAINS  . glipiZIDE (GLUCOTROL) 5 MG tablet TAKE 1 TABLET DAILY  . glucose blood (FREESTYLE LITE) test strip Use to test blood sugar once daily. Dx: E11.21  . Lancets (FREESTYLE) lancets Use to test blood sugar. Dx: E11.21  . lisinopril-hydrochlorothiazide (PRINZIDE,ZESTORETIC) 20-12.5 MG tablet Take one tablet by mouth once daily to control blood  pressure  . oxyCODONE-acetaminophen (ROXICET) 5-325 MG tablet Take 1 tablet by mouth every 8 (eight) hours as needed for severe pain.  . pravastatin (PRAVACHOL) 20 MG tablet TAKE 1 TABLET WITH EVENING MEAL  . TRADJENTA 5 MG TABS tablet TAKE 1 TABLET DAILY  . vitamin E 400 UNIT capsule Take 400 Units by mouth daily.   No facility-administered encounter medications on file as of 10/14/2019.     Review of Systems  Constitutional: Negative for appetite change, chills, fatigue and fever.  Respiratory: Negative for cough, chest tightness, shortness of breath and wheezing.   Cardiovascular: Positive for leg swelling. Negative for chest pain and palpitations.  Gastrointestinal: Negative for abdominal distention, abdominal pain, constipation, diarrhea, nausea and vomiting.  Skin: Positive for wound. Negative for color change, pallor and rash.       Right foot shallow wound/blisters   Neurological: Negative for dizziness, light-headedness, numbness and headaches.  Psychiatric/Behavioral: Negative for agitation and sleep disturbance. The patient is not nervous/anxious.     Immunization History  Administered Date(s) Administered  . Fluad Quad(high Dose 65+) 08/08/2019  . Influenza, High Dose Seasonal PF 09/11/2018  . Influenza,inj,Quad PF,6+ Mos 10/08/2013, 09/30/2014  . Influenza-Unspecified 10/17/2012, 09/12/2015, 10/01/2016, 09/23/2017  . Pneumococcal Conjugate-13 10/31/1997, 07/29/2014  . Pneumococcal Polysaccharide-23 07/30/2015  . Tdap 10/31/1997, 03/12/2014, 10/01/2016  . Zoster 07/30/2014   Pertinent  Health Maintenance Due  Topic Date Due  . FOOT EXAM  11/27/2019  . OPHTHALMOLOGY EXAM  12/04/2019  . HEMOGLOBIN A1C  02/01/2020  . INFLUENZA VACCINE  Completed  . DEXA SCAN  Completed  . PNA vac Low Risk Adult  Completed   Fall Risk  10/14/2019 10/03/2019 09/26/2019 08/08/2019 05/08/2019  Falls in the past year? 0 0 0 0 0  Number falls in past yr: 0 0 - 0 0  Injury with Fall? 0 0 - 0  0  Comment - - - - -  Risk for fall due to : - - - - -  Follow up - - - - -    Vitals:   10/14/19 1430  BP: 98/80  Pulse: 87  Temp: (!) 96.9 F (36.1 C)  TempSrc: Temporal  SpO2: 95%  Weight: 164 lb 6.4 oz (74.6 kg)  Height: 5\' 5"  (1.651 m)   Body mass index is 27.36 kg/m. Physical Exam Constitutional:      General: She is not in acute distress.    Appearance: She is overweight. She is not ill-appearing.  HENT:     Mouth/Throat:     Mouth: Mucous membranes are moist.     Pharynx: Oropharynx is clear. No oropharyngeal exudate or posterior oropharyngeal erythema.  Eyes:     General: No scleral icterus.       Right eye: No discharge.        Left eye: No discharge.     Conjunctiva/sclera: Conjunctivae normal.     Pupils: Pupils are equal, round, and reactive to light.  Cardiovascular:     Rate and Rhythm: Normal  rate and regular rhythm.     Pulses: Normal pulses.     Heart sounds: No murmur. No friction rub. No gallop.   Pulmonary:     Effort: Pulmonary effort is normal. No respiratory distress.     Breath sounds: Normal breath sounds. No wheezing, rhonchi or rales.  Chest:     Chest wall: No tenderness.  Musculoskeletal:        General: No tenderness.     Right lower leg: No edema.     Left lower leg: No edema.  Skin:    General: Skin is warm.     Coloration: Skin is not pale.     Findings: No bruising or erythema.     Comments: Right dorsal foot shallow wound bed red surrounding skin with multiple small blister and maceration.new blisters on great toe and 2 nd toe.No signs of infection noted.  Neurological:     Mental Status: She is alert and oriented to person, place, and time.     Cranial Nerves: No cranial nerve deficit.     Sensory: No sensory deficit.     Motor: No weakness.     Gait: Gait abnormal.  Psychiatric:        Mood and Affect: Mood normal.        Behavior: Behavior normal.        Thought Content: Thought content normal.        Judgment:  Judgment normal.    Labs reviewed: Recent Labs    11/26/18 1227 03/29/19 0843 08/01/19 0836  NA 142 142 142  K 4.3 4.1 4.2  CL 105 106 106  CO2 26 26 26   GLUCOSE 53* 114* 110*  BUN 32* 28* 38*  CREATININE 1.63* 1.66* 1.59*  CALCIUM 9.8 9.8 9.2   Recent Labs    11/21/18 0917 03/29/19 0843 08/01/19 0836  AST 22 18 19   ALT 20 12 14   BILITOT 0.5 0.5 0.5  PROT 6.7 7.0 6.6   Recent Labs    11/21/18 0917 03/29/19 0843 08/01/19 0836  WBC 10.1 7.9 7.7  NEUTROABS 6,636 4,764 4,112  HGB 10.8* 11.2* 10.9*  HCT 34.4* 34.7* 34.9*  MCV 86.2 87.2 87.7  PLT 199 162 150   Lab Results  Component Value Date   TSH 2.160 07/29/2014   Lab Results  Component Value Date   HGBA1C 6.3 (H) 08/01/2019   Lab Results  Component Value Date   CHOL 161 08/01/2019   HDL 49 (L) 08/01/2019   LDLCALC 95 08/01/2019   TRIG 83 08/01/2019   CHOLHDL 3.3 08/01/2019    Significant Diagnostic Results in last 30 days:  No results found.  Assessment/Plan   Blister (nonthermal), right foot, subsequent encounter Afebrile.Right dorsal foot shallow wound bed red surrounding skin with multiple small blister and maceration.new blisters on great toe and 2 nd toe.No signs of infection noted.Previous cellulitis has resolved but multiple blisters noted this visit.Dr.Reed consulted came and assessed patient's wound and blister thought patient could be reactive to triple antibiotic ointment that patient was applying to wound and cleansing with Peroxide.recommended cleaning wound with antibacterial soap and warm water,pat dry and cover with self adhesive gauze.Will re-evaluate wound/blisters in 1 week.    Family/ staff Communication: Reviewed plan of care with patient and Dr.Reed  Labs/tests ordered: None    C , NP

## 2019-10-17 ENCOUNTER — Ambulatory Visit: Payer: Medicare Other | Admitting: Family

## 2019-10-21 ENCOUNTER — Ambulatory Visit (INDEPENDENT_AMBULATORY_CARE_PROVIDER_SITE_OTHER): Payer: Medicare Other | Admitting: Family

## 2019-10-21 ENCOUNTER — Other Ambulatory Visit: Payer: Self-pay

## 2019-10-21 ENCOUNTER — Encounter: Payer: Self-pay | Admitting: Family

## 2019-10-21 VITALS — BP 124/80 | HR 81 | Temp 96.9°F | Ht 65.0 in | Wt 163.2 lb

## 2019-10-21 DIAGNOSIS — L309 Dermatitis, unspecified: Secondary | ICD-10-CM | POA: Diagnosis not present

## 2019-10-21 DIAGNOSIS — K5901 Slow transit constipation: Secondary | ICD-10-CM | POA: Diagnosis not present

## 2019-10-21 DIAGNOSIS — S90821D Blister (nonthermal), right foot, subsequent encounter: Secondary | ICD-10-CM

## 2019-10-21 MED ORDER — SENNOSIDES-DOCUSATE SODIUM 8.6-50 MG PO TABS: 2.0000 | ORAL_TABLET | Freq: Every day | ORAL | 3 refills | Status: DC

## 2019-10-21 NOTE — Progress Notes (Signed)
Provider: [name] [name] FNP-C  Gayland Curry, DO  Patient Care Team: Gayland Curry, DO as PCP - General (Geriatric Medicine) Clent Jacks, MD as Consulting Physician (Ophthalmology)  Extended Emergency Contact Information Primary Emergency Contact: Vollrath-Johnson,Lori Address: [place]          [place] of [place] Phone: [phone] Mobile Phone: [phone] Relation: None  Code Status:  DNR Goals of care: Advanced Directive information Advanced Directives 09/26/2019  Does Patient Have a Medical Advance Directive? No  Type of Advance Directive -  Does patient want to make changes to medical advance directive? -  Copy of [place] in Chart? -  Would patient like information on creating a medical advance directive? Yes (ED - Information included in AVS)     Chief Complaint  Patient presents with  . Follow-up    1 week follow up for wound check patient states foot is doing much better. Patient states she has been really itchy and just started a few days ago and would like to discuss seeing dermatologist     HPI:  Pt is a 85 y.o. female seen today for an acute visit for evaluation of 1 week follow right wound check.she stats foot is doing much better.she has been soaking foot in epsom salt and washing with warm water and antibacterial soap as previously instructed.Previous blisters have resolved.she applies Vaseline to dry skin.she denies any fever or chills.     she request referral to dermatology itching on the arms and upper back which    just started a few days ago.she states saw dermatologist in the past with similar symptoms.she was treated with desoximetasone 0.25% cream.she states still has cream but would like to see the dermatologist first.   Also complains of constipation.states had small round bowel movement yesterday.she has taken senokot-S that worked for her before.she tries to include veggies in her  diet.states probably not getting enough water but thinks with the pills she takes with water might be enough.    Past Medical History:  Diagnosis Date  . Anemia, unspecified   . Anorexia   . Chronic kidney disease   . Chronic kidney disease, stage III (moderate)   . Cystocele, midline   . Disorder of bone and cartilage, unspecified   . Kyphosis associated with other condition   . Obesity, unspecified   . Osteoarthrosis, unspecified whether generalized or localized, unspecified site   . Other and unspecified hyperlipidemia   . Other hammer toe (acquired)   . Postmenopausal atrophic vaginitis   . PVC's (premature ventricular contractions) 01/29/2015  . RBBB 01/29/2015  . Reflux esophagitis   . Type II or unspecified type diabetes mellitus without mention of complication, not stated as uncontrolled   . Unspecified constipation   . Unspecified disorder of kidney and ureter   . Unspecified essential hypertension   . Unspecified hereditary and idiopathic peripheral neuropathy   . Unspecified urinary incontinence    Past Surgical History:  Procedure Laterality Date  . BLADDER SURGERY     tie up in 2013  . EYE SURGERY  11/2016   Bilateral Cataract Sx  . TUBAL LIGATION      Allergies  Allergen Reactions  . Keflex [Cephalexin]   . Metronidazole   . Simvastatin     Leg cramsps    Outpatient Encounter Medications as of 10/21/2019  Medication Sig  . aspirin 81 MG tablet Take 81 mg by mouth daily.  . calcium-vitamin D (OSCAL  WITH D) 500-200 MG-UNIT per tablet Take 1 tablet by mouth daily.  . cholecalciferol (VITAMIN D) 1000 UNITS tablet Take 1,000 Units by mouth daily.  Marland Kitchen desoximetasone (TOPICORT) 0.25 % cream Apply 1 application topically 2 (two) times daily.  Marland Kitchen gabapentin (NEURONTIN) 300 MG capsule TAKE 1 CAPSULE IN THE MORNING AND TAKE 1 CAPSULE IN THE EVENING FOR PAINS  . glipiZIDE (GLUCOTROL) 5 MG tablet TAKE 1 TABLET DAILY  . glucose blood (FREESTYLE LITE) test strip Use to  test blood sugar once daily. Dx: E11.21  . Lancets (FREESTYLE) lancets Use to test blood sugar. Dx: E11.21  . lisinopril-hydrochlorothiazide (PRINZIDE,ZESTORETIC) 20-12.5 MG tablet Take one tablet by mouth once daily to control blood pressure  . oxyCODONE-acetaminophen (ROXICET) 5-325 MG tablet Take 1 tablet by mouth every 8 (eight) hours as needed for severe pain.  . pravastatin (PRAVACHOL) 20 MG tablet TAKE 1 TABLET WITH EVENING MEAL  . TRADJENTA 5 MG TABS tablet TAKE 1 TABLET DAILY  . vitamin E 400 UNIT capsule Take 400 Units by mouth daily.   No facility-administered encounter medications on file as of 10/21/2019.     Review of Systems  Constitutional: Negative for appetite change, chills, fatigue and fever.  Respiratory: Negative for cough, chest tightness, shortness of breath and wheezing.   Cardiovascular: Negative for chest pain, palpitations and leg swelling.  Gastrointestinal: Positive for constipation. Negative for abdominal distention, abdominal pain, diarrhea, nausea and vomiting.  Musculoskeletal: Positive for gait problem.  Skin: Negative for color change, pallor and rash.       Right foot wound /blister healed.   Psychiatric/Behavioral: Negative for agitation, confusion and sleep disturbance. The patient is not nervous/anxious.     Immunization History  Administered Date(s) Administered  . Fluad Quad(high Dose 65+) 08/08/2019  . Influenza, High Dose Seasonal PF 09/11/2018  . Influenza,inj,Quad PF,6+ Mos 10/08/2013, 09/30/2014  . Influenza-Unspecified 10/17/2012, 09/12/2015, 10/01/2016, 09/23/2017  . Pneumococcal Conjugate-13 10/31/1997, 07/29/2014  . Pneumococcal Polysaccharide-23 07/30/2015  . Tdap 10/31/1997, 03/12/2014, 10/01/2016  . Zoster 07/30/2014   Pertinent  Health Maintenance Due  Topic Date Due  . FOOT EXAM  11/27/2019  . OPHTHALMOLOGY EXAM  12/04/2019  . HEMOGLOBIN A1C  02/01/2020  . INFLUENZA VACCINE  Completed  . DEXA SCAN  Completed  . PNA vac Low  Risk Adult  Completed   Fall Risk  10/21/2019 10/14/2019 10/03/2019 09/26/2019 08/08/2019  Falls in the past year? 0 0 0 0 0  Number falls in past yr: 0 0 0 - 0  Injury with Fall? 0 0 0 - 0  Comment - - - - -  Risk for fall due to : - - - - -  Follow up - - - - -    Vitals:   10/21/19 1323  BP: 124/80  Pulse: 81  Temp: (!) 96.9 F (36.1 C)  TempSrc: Temporal  SpO2: 97%  Weight: 163 lb 3.2 oz (74 kg)  Height: 5\' 5"  (1.651 m)   Body mass index is 27.16 kg/m. Physical Exam Constitutional:      General: She is not in acute distress.    Appearance: She is overweight. She is not ill-appearing.  HENT:     Head: Normocephalic.     Mouth/Throat:     Mouth: Mucous membranes are moist.     Pharynx: Oropharynx is clear. No oropharyngeal exudate or posterior oropharyngeal erythema.  Eyes:     General: No scleral icterus.       Right eye: No discharge.  Left eye: No discharge.     Conjunctiva/sclera: Conjunctivae normal.     Pupils: Pupils are equal, round, and reactive to light.  Cardiovascular:     Rate and Rhythm: Normal rate and regular rhythm.     Pulses: Normal pulses.     Heart sounds: Normal heart sounds. No murmur. No friction rub. No gallop.   Pulmonary:     Effort: Pulmonary effort is normal. No respiratory distress.     Breath sounds: Normal breath sounds. No wheezing, rhonchi or rales.  Chest:     Chest wall: No tenderness.  Abdominal:     General: Bowel sounds are normal. There is no distension.     Palpations: Abdomen is soft. There is no mass.     Tenderness: There is no abdominal tenderness. There is no right CVA tenderness, left CVA tenderness, guarding or rebound.  Skin:    General: Skin is warm and dry.     Coloration: Skin is not pale.     Findings: No bruising, erythema or rash.     Comments: Right dorsal foot previous blister/wound has healed small red area noted with surrounding dry skin.No tenderness to palpation.   Generalized skin on arms and  back dry.Bilateral arms multiple hypopigmentation spots.   Neurological:     Mental Status: She is alert and oriented to person, place, and time.     Cranial Nerves: No cranial nerve deficit.     Sensory: No sensory deficit.     Motor: No weakness.     Coordination: Coordination normal.     Gait: Gait abnormal.  Psychiatric:        Mood and Affect: Mood normal.        Behavior: Behavior normal.        Thought Content: Thought content normal.        Judgment: Judgment normal.    Labs reviewed: Recent Labs    11/26/18 1227 03/29/19 0843 08/01/19 0836  NA 142 142 142  K 4.3 4.1 4.2  CL 105 106 106  CO2 26 26 26   GLUCOSE 53* 114* 110*  BUN 32* 28* 38*  CREATININE 1.63* 1.66* 1.59*  CALCIUM 9.8 9.8 9.2   Recent Labs    11/21/18 0917 03/29/19 0843 08/01/19 0836  AST 22 18 19   ALT 20 12 14   BILITOT 0.5 0.5 0.5  PROT 6.7 7.0 6.6   Recent Labs    11/21/18 0917 03/29/19 0843 08/01/19 0836  WBC 10.1 7.9 7.7  NEUTROABS 6,636 4,764 4,112  HGB 10.8* 11.2* 10.9*  HCT 34.4* 34.7* 34.9*  MCV 86.2 87.2 87.7  PLT 199 162 150   Lab Results  Component Value Date   TSH 2.160 07/29/2014   Lab Results  Component Value Date   HGBA1C 6.3 (H) 08/01/2019   Lab Results  Component Value Date   CHOL 161 08/01/2019   HDL 49 (L) 08/01/2019   LDLCALC 95 08/01/2019   TRIG 83 08/01/2019   CHOLHDL 3.3 08/01/2019    Significant Diagnostic Results in last 30 days:  No results found.  Assessment/Plan 1. Blister (nonthermal), right foot, subsequent encounter Resolved.small area with red non-tender to touch.continue soak foot in epsom salt and wash with warm water and antibacterial soap until completely resolved.Notify provider if symptoms recurs.    2. Slow transit constipation Had small round stools yesterday.  - senna-docusate (SENOKOT-S) 8.6-50 MG tablet; Take 2 tablets by mouth at bedtime.  Dispense: 60 tablet; Refill: 3  3. Chronic dermatitis Skin  dryness to bilateral  arms and back with multiple old hypopigmentation spots noted.suspect itching due to dry skin.Recommended apply Aquaphor ointment twice daily but request referral to dermatologist.Has seen dermatologist in the past and would like same provider though does not recall provider's Name.  - Ambulatory referral to Dermatology  Family/ staff Communication: Reviewed plan of care with patient.   Labs/tests ordered: None [name] C [name], NP

## 2019-10-29 ENCOUNTER — Ambulatory Visit (INDEPENDENT_AMBULATORY_CARE_PROVIDER_SITE_OTHER): Payer: Medicare Other | Admitting: Family

## 2019-10-29 ENCOUNTER — Encounter: Payer: Self-pay | Admitting: Family

## 2019-10-29 ENCOUNTER — Other Ambulatory Visit: Payer: Self-pay

## 2019-10-29 VITALS — BP 104/64 | HR 78 | Temp 96.8°F | Ht 65.0 in | Wt 164.6 lb

## 2019-10-29 DIAGNOSIS — E782 Mixed hyperlipidemia: Secondary | ICD-10-CM

## 2019-10-29 DIAGNOSIS — K5901 Slow transit constipation: Secondary | ICD-10-CM

## 2019-10-29 DIAGNOSIS — E1122 Type 2 diabetes mellitus with diabetic chronic kidney disease: Secondary | ICD-10-CM

## 2019-10-29 DIAGNOSIS — I10 Essential (primary) hypertension: Secondary | ICD-10-CM | POA: Diagnosis not present

## 2019-10-29 DIAGNOSIS — N184 Chronic kidney disease, stage 4 (severe): Secondary | ICD-10-CM

## 2019-10-29 NOTE — Progress Notes (Signed)
Provider: Draysen Weygandt FNP-C  Gayland Curry, DO  Patient Care Team: Gayland Curry, DO as PCP - General (Geriatric Medicine) Clent Jacks, MD as Consulting Physician (Ophthalmology)  Extended Emergency Contact Information Primary Emergency Contact: Sales-Johnson,Lori Address: [address]          [place] Phone: [phone] Mobile Phone: [phone] Relation: None  Code Status: Full Code  Goals of care: Advanced Directive information Advanced Directives 09/26/2019  Does Patient Have a Medical Advance Directive? No  Type of Advance Directive -  Does patient want to make changes to medical advance directive? -  Copy of [place] in Chart? -  Would patient like information on creating a medical advance directive? Yes (ED - Information included in AVS)     Chief Complaint  Patient presents with  . Follow-up    1 week follow up blister on right foot patient states foot feels better than it was. Patient would like to discuss need for lab work states she feels she has not had labs in a long time.   . Medication Management    patient would like to discuss if there is any need for her to use any medication on her foot    HPI:  Pt is a 85 y.o. female seen today for an acute visit for evaluation of 1 week follow up blister on right foot.she states foot feels better than it was.she would like to discuss need for use of any medication on her foot.On previous visit patient was advised to soak with epsom salt and wash with antibacterial soap until completely resolved then stop.  She would like to discuss need for lab work.she states usually gets her lab work done every 4 months. she feels she has not had labs in a long time.Her last routine visit with MD was in 07/2019 labs done then were reviewed by MD labs indicated stable. No future labs were ordered.Patient has upcoming appointment with MD 01/25/2020.she would like  labs ordered prior to this visit.  Previous lab work reviewed with patient.   Past Medical History:  Diagnosis Date  . Anemia, unspecified   . Anorexia   . Chronic kidney disease   . Chronic kidney disease, stage III (moderate)   . Cystocele, midline   . Disorder of bone and cartilage, unspecified   . Kyphosis associated with other condition   . Obesity, unspecified   . Osteoarthrosis, unspecified whether generalized or localized, unspecified site   . Other and unspecified hyperlipidemia   . Other hammer toe (acquired)   . Postmenopausal atrophic vaginitis   . PVC's (premature ventricular contractions) 01/29/2015  . RBBB 01/29/2015  . Reflux esophagitis   . Type II or unspecified type diabetes mellitus without mention of complication, not stated as uncontrolled   . Unspecified constipation   . Unspecified disorder of kidney and ureter   . Unspecified essential hypertension   . Unspecified hereditary and idiopathic peripheral neuropathy   . Unspecified urinary incontinence    Past Surgical History:  Procedure Laterality Date  . BLADDER SURGERY     tie up in 2013  . EYE SURGERY  11/2016   Bilateral Cataract Sx  . TUBAL LIGATION      Allergies  Allergen Reactions  . Keflex [Cephalexin]   . Metronidazole   . Simvastatin     Leg cramsps    Outpatient Encounter Medications as of 10/29/2019  Medication Sig  . aspirin 81 MG tablet  Take 81 mg by mouth daily.  . calcium-vitamin D (OSCAL WITH D) 500-200 MG-UNIT per tablet Take 1 tablet by mouth daily.  . cholecalciferol (VITAMIN D) 1000 UNITS tablet Take 1,000 Units by mouth daily.  Marland Kitchen desoximetasone (TOPICORT) 0.25 % cream Apply 1 application topically 2 (two) times daily.  Marland Kitchen gabapentin (NEURONTIN) 300 MG capsule TAKE 1 CAPSULE IN THE MORNING AND TAKE 1 CAPSULE IN THE EVENING FOR PAINS  . glipiZIDE (GLUCOTROL) 5 MG tablet TAKE 1 TABLET DAILY  . glucose blood (FREESTYLE LITE) test strip Use to test blood sugar once daily. Dx:  E11.21  . Lancets (FREESTYLE) lancets Use to test blood sugar. Dx: E11.21  . lisinopril-hydrochlorothiazide (PRINZIDE,ZESTORETIC) 20-12.5 MG tablet Take one tablet by mouth once daily to control blood pressure  . oxyCODONE-acetaminophen (ROXICET) 5-325 MG tablet Take 1 tablet by mouth every 8 (eight) hours as needed for severe pain.  . pravastatin (PRAVACHOL) 20 MG tablet TAKE 1 TABLET WITH EVENING MEAL  . TRADJENTA 5 MG TABS tablet TAKE 1 TABLET DAILY  . vitamin E 400 UNIT capsule Take 400 Units by mouth daily.  . [DISCONTINUED] senna-docusate (SENOKOT-S) 8.6-50 MG tablet Take 2 tablets by mouth at bedtime.   No facility-administered encounter medications on file as of 10/29/2019.     Review of Systems  Constitutional: Negative for appetite change, chills, fatigue and fever.  HENT: Negative for congestion, rhinorrhea, sinus pressure, sinus pain, sneezing, sore throat and trouble swallowing.   Eyes: Positive for visual disturbance. Negative for pain, discharge, redness and itching.  Respiratory: Negative for cough, chest tightness, shortness of breath and wheezing.   Cardiovascular: Negative for chest pain, palpitations and leg swelling.  Gastrointestinal: Negative for abdominal distention, abdominal pain, diarrhea, nausea and vomiting.       Senokot-S was effective for constipation but thinks stool was too loose.   Endocrine: Negative for cold intolerance, heat intolerance, polydipsia, polyphagia and polyuria.  Genitourinary: Negative for difficulty urinating, dysuria, flank pain, frequency and urgency.  Musculoskeletal: Positive for gait problem.  Skin: Negative for color change, pallor, rash and wound.       Previous right foot blister resolved   Neurological: Negative for dizziness, speech difficulty, weakness, light-headedness and headaches.       Hx neuropathy   Hematological: Does not bruise/bleed easily.  Psychiatric/Behavioral: Negative for agitation and sleep disturbance. The  patient is not nervous/anxious.        Forgetful    Immunization History  Administered Date(s) Administered  . Fluad Quad(high Dose 65+) 08/08/2019  . Influenza, High Dose Seasonal PF 09/11/2018  . Influenza,inj,Quad PF,6+ Mos 10/08/2013, 09/30/2014  . Influenza-Unspecified 10/17/2012, 09/12/2015, 10/01/2016, 09/23/2017  . Pneumococcal Conjugate-13 10/31/1997, 07/29/2014  . Pneumococcal Polysaccharide-23 07/30/2015  . Tdap 10/31/1997, 03/12/2014, 10/01/2016  . Zoster 07/30/2014   Pertinent  Health Maintenance Due  Topic Date Due  . FOOT EXAM  11/27/2019  . OPHTHALMOLOGY EXAM  12/04/2019  . HEMOGLOBIN A1C  02/01/2020  . INFLUENZA VACCINE  Completed  . DEXA SCAN  Completed  . PNA vac Low Risk Adult  Completed   Fall Risk  10/29/2019 10/21/2019 10/14/2019 10/03/2019 09/26/2019  Falls in the past year? 0 0 0 0 0  Number falls in past yr: 0 0 0 0 -  Injury with Fall? 0 0 0 0 -  Comment - - - - -  Risk for fall due to : - - - - -  Follow up - - - - -    Vitals:  10/29/19 1523  BP: 104/64  Pulse: 78  Temp: (!) 96.8 F (36 C)  TempSrc: Temporal  SpO2: 96%  Weight: 164 lb 9.6 oz (74.7 kg)  Height: 5' 5"  (1.651 m)   Body mass index is 27.39 kg/m. Physical Exam Vitals signs reviewed.  Constitutional:      General: She is not in acute distress.    Appearance: She is overweight. She is not ill-appearing.  HENT:     Head: Normocephalic.  Eyes:     General: No scleral icterus.       Right eye: No discharge.        Left eye: No discharge.     Conjunctiva/sclera: Conjunctivae normal.     Pupils: Pupils are equal, round, and reactive to light.  Cardiovascular:     Rate and Rhythm: Normal rate and regular rhythm.     Pulses: Normal pulses.     Heart sounds: Normal heart sounds. No murmur. No friction rub. No gallop.   Pulmonary:     Effort: Pulmonary effort is normal. No respiratory distress.     Breath sounds: Normal breath sounds. No wheezing, rhonchi or rales.  Chest:      Chest wall: No tenderness.  Abdominal:     General: Bowel sounds are normal. There is no distension.     Palpations: Abdomen is soft. There is no mass.     Tenderness: There is no abdominal tenderness. There is no right CVA tenderness, left CVA tenderness, guarding or rebound.  Musculoskeletal:        General: No swelling or tenderness.     Right lower leg: No edema.     Left lower leg: No edema.     Comments: Unsteady gait walks with a cane.   Skin:    General: Skin is warm and dry.     Coloration: Skin is not pale.     Findings: No bruising, erythema or rash.     Comments: Right dorsal foot previous blister site skin tissue pink in color blister resolved.second toe previous blister area with small dry scab.Non-tender to touch.     Neurological:     Mental Status: She is alert. Mental status is at baseline.     Cranial Nerves: No cranial nerve deficit.     Motor: No weakness.     Gait: Gait abnormal.  Psychiatric:        Mood and Affect: Mood normal.        Behavior: Behavior normal.        Thought Content: Thought content normal.        Judgment: Judgment normal.    Labs reviewed: Recent Labs    11/26/18 1227 03/29/19 0843 08/01/19 0836  NA 142 142 142  K 4.3 4.1 4.2  CL 105 106 106  CO2 26 26 26   GLUCOSE 53* 114* 110*  BUN 32* 28* 38*  CREATININE 1.63* 1.66* 1.59*  CALCIUM 9.8 9.8 9.2   Recent Labs    11/21/18 0917 03/29/19 0843 08/01/19 0836  AST 22 18 19   ALT 20 12 14   BILITOT 0.5 0.5 0.5  PROT 6.7 7.0 6.6   Recent Labs    11/21/18 0917 03/29/19 0843 08/01/19 0836  WBC 10.1 7.9 7.7  NEUTROABS 6,636 4,764 4,112  HGB 10.8* 11.2* 10.9*  HCT 34.4* 34.7* 34.9*  MCV 86.2 87.2 87.7  PLT 199 162 150   Lab Results  Component Value Date   TSH 2.160 07/29/2014   Lab Results  Component  Value Date   HGBA1C 6.3 (H) 08/01/2019   Lab Results  Component Value Date   CHOL 161 08/01/2019   HDL 49 (L) 08/01/2019   LDLCALC 95 08/01/2019   TRIG 83  08/01/2019   CHOLHDL 3.3 08/01/2019    Significant Diagnostic Results in last 30 days:  No results found.  Assessment/Plan 1. Type 2 diabetes mellitus with stage 4 chronic kidney disease, without long-term current use of insulin (HCC) Lab Results  Component Value Date   HGBA1C 6.3 (H) 08/01/2019  Continue on glipizide and tradjenta.on Gabapentin for neuropathy.on ASA and Statin for cardiovascular event prophylaxis. Has up coming appointment in [place] for annual foot and eye exam.   - TSH; Future - Hemoglobin A1c; Future  2. Mixed hyperlipidemia LDL at goal.continue on dietary modification.continue on pravastatin 20 mg tablet daily.  - Lipid panel; Future  3. Benign essential HTN B/p at goal.Not on medication.On ASA and Statin.  - CBC with Differential/Platelet; Future - CMP with eGFR([facility]); Future  4. Slow transit constipation Took previous prescribed Senokot- S but states stool was loose.Encouraged to use Senokot- S as needed.   Family/ staff Communication: Reviewed plan of care with patient.  Labs/tests ordered:  - TSH; Future - Hemoglobin A1c; Future - CBC with Differential/Platelet; Future - CMP with eGFR([facility]); Future - Lipid panel; Future  Sandrea Hughs, NP

## 2019-11-04 ENCOUNTER — Ambulatory Visit (INDEPENDENT_AMBULATORY_CARE_PROVIDER_SITE_OTHER): Payer: Medicare Other | Admitting: Orthotics

## 2019-11-04 ENCOUNTER — Other Ambulatory Visit: Payer: Self-pay

## 2019-11-04 DIAGNOSIS — M2141 Flat foot [pes planus] (acquired), right foot: Secondary | ICD-10-CM | POA: Diagnosis not present

## 2019-11-04 DIAGNOSIS — E1142 Type 2 diabetes mellitus with diabetic polyneuropathy: Secondary | ICD-10-CM

## 2019-11-04 DIAGNOSIS — L84 Corns and callosities: Secondary | ICD-10-CM

## 2019-11-04 DIAGNOSIS — M2042 Other hammer toe(s) (acquired), left foot: Secondary | ICD-10-CM | POA: Diagnosis not present

## 2019-11-04 DIAGNOSIS — M2142 Flat foot [pes planus] (acquired), left foot: Secondary | ICD-10-CM | POA: Diagnosis not present

## 2019-11-04 DIAGNOSIS — M2041 Other hammer toe(s) (acquired), right foot: Secondary | ICD-10-CM | POA: Diagnosis not present

## 2019-11-04 NOTE — Progress Notes (Signed)

## 2019-11-15 ENCOUNTER — Other Ambulatory Visit: Payer: Self-pay | Admitting: *Deleted

## 2019-11-15 DIAGNOSIS — M48062 Spinal stenosis, lumbar region with neurogenic claudication: Secondary | ICD-10-CM

## 2019-11-15 MED ORDER — OXYCODONE-ACETAMINOPHEN 5-325 MG PO TABS: 1.0000 | ORAL_TABLET | Freq: Three times a day (TID) | ORAL | 0 refills | Status: DC | PRN

## 2019-11-15 NOTE — Telephone Encounter (Signed)
Please add need to review opioid contract to her April appt.

## 2019-11-15 NOTE — Telephone Encounter (Signed)
Patient requested Refill [place] Database Verified LR: 09/26/2019 Pended Rx and sent to Dr. Mariea Clonts for approval.

## 2019-12-04 LAB — HM DIABETES EYE EXAM

## 2020-01-06 ENCOUNTER — Other Ambulatory Visit: Payer: Self-pay

## 2020-01-06 DIAGNOSIS — M48062 Spinal stenosis, lumbar region with neurogenic claudication: Secondary | ICD-10-CM

## 2020-01-06 MED ORDER — OXYCODONE-ACETAMINOPHEN 5-325 MG PO TABS: 1.0000 | ORAL_TABLET | Freq: Three times a day (TID) | ORAL | 0 refills | Status: DC | PRN

## 2020-01-06 NOTE — Telephone Encounter (Signed)
Patient requested refill [place]CC[place] Databse Verified LR: 11/15/2019 Appt scheduled for February for Narcotic Contract update Pended Rx and sent to Dr. Mariea Clonts for approval.

## 2020-01-06 NOTE — Telephone Encounter (Signed)
Patient called and states that she wants Oxycodone refilled. Patient last Pain Contract was signed in 2016. Patient last pain medication refill was in December 2020. Patient was called and notified that another pain contract needs to be on file and patient was scheduled for February 25th, 2021.

## 2020-02-06 ENCOUNTER — Ambulatory Visit: Payer: Self-pay | Admitting: Internal Medicine

## 2020-02-20 ENCOUNTER — Other Ambulatory Visit: Payer: Self-pay

## 2020-02-20 ENCOUNTER — Other Ambulatory Visit: Payer: Self-pay | Admitting: Internal Medicine

## 2020-02-20 DIAGNOSIS — M48062 Spinal stenosis, lumbar region with neurogenic claudication: Secondary | ICD-10-CM

## 2020-02-20 DIAGNOSIS — E1121 Type 2 diabetes mellitus with diabetic nephropathy: Secondary | ICD-10-CM

## 2020-02-20 MED ORDER — OXYCODONE-ACETAMINOPHEN 5-325 MG PO TABS: 1.0000 | ORAL_TABLET | Freq: Three times a day (TID) | ORAL | 0 refills | Status: DC | PRN

## 2020-02-20 NOTE — Telephone Encounter (Signed)
Left detailed message informing patient RX sent to pharmacy

## 2020-02-20 NOTE — Telephone Encounter (Signed)
Incoming call received requesting refill on Oxycodone to [facility] was last filled on 01/06/2020 patient has pending appointment on 03/23/20 to update opioid treatment agreement

## 2020-03-09 ENCOUNTER — Other Ambulatory Visit: Payer: Self-pay

## 2020-03-09 ENCOUNTER — Ambulatory Visit (INDEPENDENT_AMBULATORY_CARE_PROVIDER_SITE_OTHER): Payer: Medicare Other | Admitting: Podiatry

## 2020-03-09 VITALS — Temp 97.1°F

## 2020-03-09 DIAGNOSIS — M2041 Other hammer toe(s) (acquired), right foot: Secondary | ICD-10-CM

## 2020-03-09 DIAGNOSIS — M79676 Pain in unspecified toe(s): Secondary | ICD-10-CM | POA: Diagnosis not present

## 2020-03-09 DIAGNOSIS — E119 Type 2 diabetes mellitus without complications: Secondary | ICD-10-CM | POA: Diagnosis not present

## 2020-03-09 DIAGNOSIS — M2011 Hallux valgus (acquired), right foot: Secondary | ICD-10-CM | POA: Diagnosis not present

## 2020-03-09 DIAGNOSIS — M2042 Other hammer toe(s) (acquired), left foot: Secondary | ICD-10-CM

## 2020-03-09 DIAGNOSIS — E1142 Type 2 diabetes mellitus with diabetic polyneuropathy: Secondary | ICD-10-CM | POA: Diagnosis not present

## 2020-03-09 DIAGNOSIS — B351 Tinea unguium: Secondary | ICD-10-CM | POA: Diagnosis not present

## 2020-03-09 DIAGNOSIS — L84 Corns and callosities: Secondary | ICD-10-CM | POA: Diagnosis not present

## 2020-03-09 NOTE — Patient Instructions (Addendum)
Apply Neosporin to toenails once daily for one week  Diabetes Mellitus and Foot Care Foot care is an important part of your health, especially when you have diabetes. Diabetes may cause you to have problems because of poor blood flow (circulation) to your feet and legs, which can cause your skin to:  Become thinner and drier.  Break more easily.  Heal more slowly.  Peel and crack. You may also have nerve damage (neuropathy) in your legs and feet, causing decreased feeling in them. This means that you may not notice minor injuries to your feet that could lead to more serious problems. Noticing and addressing any potential problems early is the best way to prevent future foot problems. How to care for your feet Foot hygiene  Wash your feet daily with warm water and mild soap. Do not use hot water. Then, pat your feet and the areas between your toes until they are completely dry. Do not soak your feet as this can dry your skin.  Trim your toenails straight across. Do not dig under them or around the cuticle. File the edges of your nails with an emery board or nail file.  Apply a moisturizing lotion or petroleum jelly to the skin on your feet and to dry, brittle toenails. Use lotion that does not contain alcohol and is unscented. Do not apply lotion between your toes. Shoes and socks  Wear clean socks or stockings every day. Make sure they are not too tight. Do not wear knee-high stockings since they may decrease blood flow to your legs.  Wear shoes that fit properly and have enough cushioning. Always look in your shoes before you put them on to be sure there are no objects inside.  To break in new shoes, wear them for just a few hours a day. This prevents injuries on your feet. Wounds, scrapes, corns, and calluses  Check your feet daily for blisters, cuts, bruises, sores, and redness. If you cannot see the bottom of your feet, use a mirror or ask someone for help.  Do not cut corns or  calluses or try to remove them with medicine.  If you find a minor scrape, cut, or break in the skin on your feet, keep it and the skin around it clean and dry. You may clean these areas with mild soap and water. Do not clean the area with peroxide, alcohol, or iodine.  If you have a wound, scrape, corn, or callus on your foot, look at it several times a day to make sure it is healing and not infected. Check for: ? Redness, swelling, or pain. ? Fluid or blood. ? Warmth. ? Pus or a bad smell. General instructions  Do not cross your legs. This may decrease blood flow to your feet.  Do not use heating pads or hot water bottles on your feet. They may burn your skin. If you have lost feeling in your feet or legs, you may not know this is happening until it is too late.  Protect your feet from hot and cold by wearing shoes, such as at the beach or on hot pavement.  Schedule a complete foot exam at least once a year (annually) or more often if you have foot problems. If you have foot problems, report any cuts, sores, or bruises to your health care provider immediately. Contact a health care provider if:  You have a medical condition that increases your risk of infection and you have any cuts, sores, or bruises on  your feet.  You have an injury that is not healing.  You have redness on your legs or feet.  You feel burning or tingling in your legs or feet.  You have pain or cramps in your legs and feet.  Your legs or feet are numb.  Your feet always feel cold.  You have pain around a toenail. Get help right away if:  You have a wound, scrape, corn, or callus on your foot and: ? You have pain, swelling, or redness that gets worse. ? You have fluid or blood coming from the wound, scrape, corn, or callus. ? Your wound, scrape, corn, or callus feels warm to the touch. ? You have pus or a bad smell coming from the wound, scrape, corn, or callus. ? You have a fever. ? You have a red line  going up your leg. Summary  Check your feet every day for cuts, sores, red spots, swelling, and blisters.  Moisturize feet and legs daily.  Wear shoes that fit properly and have enough cushioning.  If you have foot problems, report any cuts, sores, or bruises to your health care provider immediately.  Schedule a complete foot exam at least once a year (annually) or more often if you have foot problems. This information is not intended to replace advice given to you by your health care provider. Make sure you discuss any questions you have with your health care provider. Document Revised: 08/21/2019 Document Reviewed: 12/30/2016 Elsevier Patient Education  [place].  Peripheral Neuropathy Peripheral neuropathy is a type of nerve damage. It affects nerves that carry signals between the spinal cord and the arms, legs, and the rest of the body (peripheral nerves). It does not affect nerves in the spinal cord or brain. In peripheral neuropathy, one nerve or a group of nerves may be damaged. Peripheral neuropathy is a broad category that includes many specific nerve disorders, like diabetic neuropathy, hereditary neuropathy, and carpal tunnel syndrome. What are the causes? This condition may be caused by:  Diabetes. This is the most common cause of peripheral neuropathy.  Nerve injury.  Pressure or stress on a nerve that lasts a long time.  Lack (deficiency) of B vitamins. This can result from alcoholism, poor diet, or a restricted diet.  Infections.  Autoimmune diseases, such as rheumatoid arthritis and systemic lupus erythematosus.  Nerve diseases that are passed from parent to child (inherited).  Some medicines, such as cancer medicines (chemotherapy).  Poisonous (toxic) substances, such as lead and mercury.  Too little blood flowing to the legs.  Kidney disease.  Thyroid disease. In some cases, the cause of this condition is not known. What are the signs or  symptoms? Symptoms of this condition depend on which of your nerves is damaged. Common symptoms include:  Loss of feeling (numbness) in the feet, hands, or both.  Tingling in the feet, hands, or both.  Burning pain.  Very sensitive skin.  Weakness.  Not being able to move a part of the body (paralysis).  Muscle twitching.  Clumsiness or poor coordination.  Loss of balance.  Not being able to control your bladder.  Feeling dizzy.  Sexual problems. How is this diagnosed? Diagnosing and finding the cause of peripheral neuropathy can be difficult. Your health care provider will take your medical history and do a physical exam. A neurological exam will also be done. This involves checking things that are affected by your brain, spinal cord, and nerves (nervous system). For example, your health  care provider will check your reflexes, how you move, and what you can feel. You may have other tests, such as:  Blood tests.  Electromyogram (EMG) and nerve conduction tests. These tests check nerve function and how well the nerves are controlling the muscles.  Imaging tests, such as CT scans or MRI to rule out other causes of your symptoms.  Removing a small piece of nerve to be examined in a lab (nerve biopsy). This is rare.  Removing and examining a small amount of the fluid that surrounds the brain and spinal cord (lumbar puncture). This is rare. How is this treated? Treatment for this condition may involve:  Treating the underlying cause of the neuropathy, such as diabetes, kidney disease, or vitamin deficiencies.  Stopping medicines that can cause neuropathy, such as chemotherapy.  Medicine to relieve pain. Medicines may include: ? Prescription or over-the-counter pain medicine. ? Antiseizure medicine. ? Antidepressants. ? Pain-relieving patches that are applied to painful areas of skin.  Surgery to relieve pressure on a nerve or to destroy a nerve that is causing  pain.  Physical therapy to help improve movement and balance.  Devices to help you move around (assistive devices). Follow these instructions at home: Medicines  Take over-the-counter and prescription medicines only as told by your health care provider. Do not take any other medicines without first asking your health care provider.  Do not drive or use heavy machinery while taking prescription pain medicine. Lifestyle   Do not use any products that contain nicotine or tobacco, such as cigarettes and e-cigarettes. Smoking keeps blood from reaching damaged nerves. If you need help quitting, ask your health care provider.  Avoid or limit alcohol. Too much alcohol can cause a vitamin B deficiency, and vitamin B is needed for healthy nerves.  Eat a healthy diet. This includes: ? Eating foods that are high in fiber, such as fresh fruits and vegetables, whole grains, and beans. ? Limiting foods that are high in fat and processed sugars, such as fried or sweet foods. General instructions   If you have diabetes, work closely with your health care provider to keep your blood sugar under control.  If you have numbness in your feet: ? Check every day for signs of injury or infection. Watch for redness, warmth, and swelling. ? Wear padded socks and comfortable shoes. These help protect your feet.  Develop a good support system. Living with peripheral neuropathy can be stressful. Consider talking with a mental health specialist or joining a support group.  Use assistive devices and attend physical therapy as told by your health care provider. This may include using a walker or a cane.  Keep all follow-up visits as told by your health care provider. This is important. Contact a health care provider if:  You have new signs or symptoms of peripheral neuropathy.  You are struggling emotionally from dealing with peripheral neuropathy.  Your pain is not well-controlled. Get help right away  if:  You have an injury or infection that is not healing normally.  You develop new weakness in an arm or leg.  You fall frequently. Summary  Peripheral neuropathy is when the nerves in the arms, or legs are damaged, resulting in numbness, weakness, or pain.  There are many causes of peripheral neuropathy, including diabetes, pinched nerves, vitamin deficiencies, autoimmune disease, and hereditary conditions.  Diagnosing and finding the cause of peripheral neuropathy can be difficult. Your health care provider will take your medical history, do a physical exam,  and do tests, including blood tests and nerve function tests.  Treatment involves treating the underlying cause of the neuropathy and taking medicines to help control pain. Physical therapy and assistive devices may also help. This information is not intended to replace advice given to you by your health care provider. Make sure you discuss any questions you have with your health care provider. Document Revised: 11/10/2017 Document Reviewed: 02/06/2017 Elsevier Patient Education  2020 [employer].

## 2020-03-11 ENCOUNTER — Encounter: Payer: Self-pay | Admitting: Podiatry

## 2020-03-11 NOTE — Progress Notes (Addendum)
Subjective: Dawn [name] presents today for for annual diabetic foot examination and corn(s) right 5th digit and painful mycotic toenails b/l that are difficult to trim. Pain interferes with ambulation. Aggravating factors include wearing enclosed shoe gear. Pain is relieved with periodic professional debridement.   She relates she has received her diabetic shoes and they fit well. She notes no problems with her shoes.  Past Medical History:  Diagnosis Date  . Anemia, unspecified   . Anorexia   . Chronic kidney disease   . Chronic kidney disease, stage III (moderate)   . Cystocele, midline   . Disorder of bone and cartilage, unspecified   . Kyphosis associated with other condition   . Obesity, unspecified   . Osteoarthrosis, unspecified whether generalized or localized, unspecified site   . Other and unspecified hyperlipidemia   . Other hammer toe (acquired)   . Postmenopausal atrophic vaginitis   . PVC's (premature ventricular contractions) 01/29/2015  . RBBB 01/29/2015  . Reflux esophagitis   . Type II or unspecified type diabetes mellitus without mention of complication, not stated as uncontrolled   . Unspecified constipation   . Unspecified disorder of kidney and ureter   . Unspecified essential hypertension   . Unspecified hereditary and idiopathic peripheral neuropathy   . Unspecified urinary incontinence     Patient Active Problem List   Diagnosis Date Noted  . Colitis 11/26/2018  . Advance care planning 11/20/2017  . PVC's (premature ventricular contractions) 01/29/2015  . RBBB 01/29/2015  . Mixed hyperlipidemia 01/06/2015  . Osteopenia 01/06/2015  . Benign essential HTN 07/29/2014  . Weight gain 07/29/2014  . Therapeutic opioid induced constipation 07/29/2014  . Spinal stenosis, lumbar region, with neurogenic claudication 04/30/2014  . Neuropathic pain 03/05/2014  . Bilateral leg pain 02/05/2014  . DM type 2 with diabetic peripheral neuropathy ([place]) 02/05/2014  .  Unspecified constipation 01/08/2014  . Leg pain 01/08/2014  . Routine general medical examination at a health care facility 01/08/2014  . Type 2 diabetes mellitus with stage 4 chronic kidney disease, without long-term current use of insulin ([place]) 10/08/2013  . Hypertensive renal disease 10/08/2013  . Need for prophylactic vaccination and inoculation against influenza 10/08/2013  . Generalized OA 07/23/2013  . UTI (urinary tract infection) 06/19/2013  . Hypoglycemia 06/19/2013  . Diabetes mellitus with nephropathy ([place]) 03/21/2013  . Osteoarthritis   . Other and unspecified hyperlipidemia   . Chronic kidney disease     Past Surgical History:  Procedure Laterality Date  . BLADDER SURGERY     tie up in 2013  . EYE SURGERY  11/2016   Bilateral Cataract Sx  . TUBAL LIGATION      Current Outpatient Medications on File Prior to Visit  Medication Sig Dispense Refill  . aspirin 81 MG tablet Take 81 mg by mouth daily.    . calcium-vitamin D (OSCAL WITH D) 500-200 MG-UNIT per tablet Take 1 tablet by mouth daily.    . cholecalciferol (VITAMIN D) 1000 UNITS tablet Take 1,000 Units by mouth daily.    Marland Kitchen desoximetasone (TOPICORT) 0.25 % cream Apply 1 application topically 2 (two) times daily. 30 g 1  . gabapentin (NEURONTIN) 300 MG capsule TAKE 1 CAPSULE IN THE MORNING AND TAKE 1 CAPSULE IN THE EVENING FOR PAINS 180 capsule 1  . glipiZIDE (GLUCOTROL) 5 MG tablet TAKE 1 TABLET DAILY 90 tablet 3  . glucose blood (FREESTYLE LITE) test strip Use to test blood sugar once daily. Dx: E11.21 300 each 3  . Lancets (  FREESTYLE) lancets Use to test blood sugar. Dx: E11.21 300 each 3  . lisinopril-hydrochlorothiazide (ZESTORETIC) 20-12.5 MG tablet TAKE 1 TABLET DAILY TO CONTROL BLOOD PRESSURE 90 tablet 3  . oxyCODONE-acetaminophen (ROXICET) 5-325 MG tablet Take 1 tablet by mouth every 8 (eight) hours as needed for severe pain. 90 tablet 0  . pravastatin (PRAVACHOL) 20 MG tablet TAKE 1 TABLET WITH EVENING MEAL  90 tablet 1  . TRADJENTA 5 MG TABS tablet TAKE 1 TABLET DAILY 90 tablet 1  . vitamin E 400 UNIT capsule Take 400 Units by mouth daily.     No current facility-administered medications on file prior to visit.     Allergies  Allergen Reactions  . Keflex [Cephalexin]   . Metronidazole   . Simvastatin     Leg cramsps    Social History   Occupational History  . Occupation: retired Occupational psychologist  Tobacco Use  . Smoking status: Former Smoker    Quit date: 12/12/1968    Years since quitting: 51.2  . Smokeless tobacco: Never Used  . Tobacco comment: quit in 1970  Substance and Sexual Activity  . Alcohol use: No    Alcohol/week: 0.0 standard drinks  . Drug use: No  . Sexual activity: Never    Family History  Problem Relation Age of Onset  . Heart disease Mother   . Heart disease Sister   . Cancer Son   . Cancer Son   . Stomach cancer Neg Hx   . Colon cancer Neg Hx   . Pancreatic cancer Neg Hx     Immunization History  Administered Date(s) Administered  . Fluad Quad(high Dose 65+) 08/08/2019  . Influenza, High Dose Seasonal PF 09/11/2018  . Influenza,inj,Quad PF,6+ Mos 10/08/2013, 09/30/2014  . Influenza-Unspecified 10/17/2012, 09/12/2015, 10/01/2016, 09/23/2017  . Pneumococcal Conjugate-13 10/31/1997, 07/29/2014  . Pneumococcal Polysaccharide-23 07/30/2015  . Tdap 10/31/1997, 03/12/2014, 10/01/2016  . Zoster 07/30/2014     Objective: Vitals:   03/09/20 1531  Temp: (!) 97.1 F (36.2 C)    Dawn [name] is a/an 86 y.o. AA female   WD, WN in NAD. AAO X 3.  Vascular Examination: Capillary fill time to digits <3 seconds b/l. Faintly palpable DP pulses b/l. Faintly palpable PT pulses b/l. Pedal hair absent b/l Skin temperature gradient within normal limits b/l.  Dermatological Examination: Pedal skin with normal turgor, texture and tone bilaterally. No open wounds bilaterally. No interdigital macerations bilaterally. Toenails 1-5 b/l elongated, dystrophic,  thickened, crumbly with subungual debris and tenderness to dorsal palpation. Hyperkeratotic lesion(s) R 5th toe.  No erythema, no edema, no drainage, no flocculence.  Musculoskeletal Examination: Normal muscle strength 5/5 to all lower extremity muscle groups bilaterally, no pain crepitus or joint limitation noted with ROM b/l, bunion deformity noted b/l, hammertoes noted to the  2-5 bilaterally, pes planus deformity noted and wearing diabetic shoes today.  Neurological Examination: Protective sensation diminished with 10g monofilament b/l. Vibratory sensation absent b/l  Hemoglobin A1C Latest Ref Rng & Units 08/01/2019 03/29/2019  HGBA1C <5.7 % of total Hgb 6.3(H) 6.4(H)  Some recent data might be hidden   Assessment: 1. Pain due to onychomycosis of nail   2. Corns   3. Acquired hammertoes of both feet   4. Encounter for diabetic foot exam ([place])   5. Hallux valgus, acquired, bilateral   6. Diabetic peripheral neuropathy associated with type 2 diabetes mellitus ([place])     Plan: -Diabetic foot examination performed on today's visit. -Continue diabetic foot care principles.  Literature dispensed on today.  -Toenails 1-5 b/l were debrided in length and girth with sterile nail nippers and dremel without iatrogenic bleeding.  -Corn(s) debrided R 5th toe without complication or incident. Total number debrided=1. -Continue diabetic shoes daily. -Patient to report any pedal injuries to medical professional immediately. -Patient/POA to call should there be question/concern in the interim.  Return in about 3 months (around 06/09/2020) for diabetic nail and callus trim.

## 2020-03-19 ENCOUNTER — Other Ambulatory Visit: Payer: Self-pay

## 2020-03-19 ENCOUNTER — Other Ambulatory Visit: Payer: Medicare Other

## 2020-03-19 DIAGNOSIS — E782 Mixed hyperlipidemia: Secondary | ICD-10-CM

## 2020-03-19 DIAGNOSIS — I10 Essential (primary) hypertension: Secondary | ICD-10-CM

## 2020-03-19 DIAGNOSIS — E1122 Type 2 diabetes mellitus with diabetic chronic kidney disease: Secondary | ICD-10-CM

## 2020-03-20 LAB — CBC WITH DIFFERENTIAL/PLATELET
Absolute Monocytes: 518 cells/uL (ref 200–950)
Basophils Absolute: 72 cells/uL (ref 0–200)
Basophils Relative: 1 %
Eosinophils Absolute: 389 cells/uL (ref 15–500)
Eosinophils Relative: 5.4 %
HCT: 34.8 % — ABNORMAL LOW (ref 35.0–45.0)
Hemoglobin: 10.8 g/dL — ABNORMAL LOW (ref 11.7–15.5)
Lymphs Abs: 1548 cells/uL (ref 850–3900)
MCH: 27.6 pg (ref 27.0–33.0)
MCHC: 31 g/dL — ABNORMAL LOW (ref 32.0–36.0)
MCV: 88.8 fL (ref 80.0–100.0)
MPV: 11.5 fL (ref 7.5–12.5)
Monocytes Relative: 7.2 %
Neutro Abs: 4673 cells/uL (ref 1500–7800)
Neutrophils Relative %: 64.9 %
Platelets: 160 10*3/uL (ref 140–400)
RBC: 3.92 10*6/uL (ref 3.80–5.10)
RDW: 13.8 % (ref 11.0–15.0)
Total Lymphocyte: 21.5 %
WBC: 7.2 10*3/uL (ref 3.8–10.8)

## 2020-03-20 LAB — LIPID PANEL
Cholesterol: 137 mg/dL (ref <200)
HDL: 41 mg/dL — ABNORMAL LOW (ref >=50)
LDL Cholesterol (Calc): 73 mg/dL (calc)
Non-HDL Cholesterol (Calc): 96 mg/dL (calc) (ref <130)
Total CHOL/HDL Ratio: 3.3 (calc) (ref <5.0)
Triglycerides: 146 mg/dL (ref <150)

## 2020-03-20 LAB — HEMOGLOBIN A1C
Hgb A1c MFr Bld: 6 % of total Hgb — ABNORMAL HIGH (ref <5.7)
Mean Plasma Glucose: 126 (calc)
eAG (mmol/L): 7 (calc)

## 2020-03-20 LAB — TSH: TSH: 2.57 mIU/L (ref 0.40–4.50)

## 2020-03-20 LAB — COMPLETE METABOLIC PANEL WITH GFR
AG Ratio: 1.3 (calc) (ref 1.0–2.5)
ALT: 12 U/L (ref 6–29)
AST: 18 U/L (ref 10–35)
Albumin: 3.8 g/dL (ref 3.6–5.1)
Alkaline phosphatase (APISO): 70 U/L (ref 37–153)
BUN/Creatinine Ratio: 19 (calc) (ref 6–22)
BUN: 34 mg/dL — ABNORMAL HIGH (ref 7–25)
CO2: 29 mmol/L (ref 20–32)
Calcium: 9.8 mg/dL (ref 8.6–10.4)
Chloride: 106 mmol/L (ref 98–110)
Creat: 1.76 mg/dL — ABNORMAL HIGH (ref 0.60–0.88)
GFR, Est African American: 29 mL/min/{1.73_m2} — ABNORMAL LOW (ref >=60)
GFR, Est Non African American: 25 mL/min/{1.73_m2} — ABNORMAL LOW (ref >=60)
Globulin: 3 g/dL (calc) (ref 1.9–3.7)
Glucose, Bld: 102 mg/dL — ABNORMAL HIGH (ref 65–99)
Potassium: 4.4 mmol/L (ref 3.5–5.3)
Sodium: 143 mmol/L (ref 135–146)
Total Bilirubin: 0.3 mg/dL (ref 0.2–1.2)
Total Protein: 6.8 g/dL (ref 6.1–8.1)

## 2020-03-23 ENCOUNTER — Ambulatory Visit (INDEPENDENT_AMBULATORY_CARE_PROVIDER_SITE_OTHER): Payer: Medicare Other | Admitting: Internal Medicine

## 2020-03-23 ENCOUNTER — Other Ambulatory Visit: Payer: Self-pay

## 2020-03-23 ENCOUNTER — Encounter: Payer: Self-pay | Admitting: Internal Medicine

## 2020-03-23 ENCOUNTER — Telehealth: Payer: Self-pay

## 2020-03-23 VITALS — BP 127/84 | HR 76 | Temp 97.8°F | Resp 16 | Ht 65.0 in | Wt 159.2 lb

## 2020-03-23 DIAGNOSIS — E782 Mixed hyperlipidemia: Secondary | ICD-10-CM | POA: Diagnosis not present

## 2020-03-23 DIAGNOSIS — E1122 Type 2 diabetes mellitus with diabetic chronic kidney disease: Secondary | ICD-10-CM | POA: Diagnosis not present

## 2020-03-23 DIAGNOSIS — E1142 Type 2 diabetes mellitus with diabetic polyneuropathy: Secondary | ICD-10-CM

## 2020-03-23 DIAGNOSIS — K8689 Other specified diseases of pancreas: Secondary | ICD-10-CM

## 2020-03-23 DIAGNOSIS — M48062 Spinal stenosis, lumbar region with neurogenic claudication: Secondary | ICD-10-CM | POA: Diagnosis not present

## 2020-03-23 DIAGNOSIS — I10 Essential (primary) hypertension: Secondary | ICD-10-CM

## 2020-03-23 DIAGNOSIS — N184 Chronic kidney disease, stage 4 (severe): Secondary | ICD-10-CM

## 2020-03-23 MED ORDER — PRECISION XTRA W/DEVICE KIT: 1.0000 | PACK | Freq: Every day | 0 refills | Status: DC

## 2020-03-23 MED ORDER — PRECISION ULTRA LANCET MISC: 1 refills | Status: DC

## 2020-03-23 MED ORDER — PRECISION QID TEST VI STRP: ORAL_STRIP | 1 refills | Status: DC

## 2020-03-23 NOTE — Telephone Encounter (Signed)
Was she ok with getting the precision meter since we sent that?

## 2020-03-23 NOTE — Progress Notes (Signed)
Location:  [facility] clinic Provider:  Dashea Mcmullan L. Mariea Clonts, D.O., C.M.D.  Code Status: need to discuss again b/c we still don't have any ACP on file and code status remains full code Goals of Care:  Advanced Directives 03/23/2020  Does Patient Have a Medical Advance Directive? Yes  Type of Advance Directive Living will;Healthcare Power of Attorney  Does patient want to make changes to medical advance directive? -  Copy of [place] in Chart? No - copy requested  Would patient like information on creating a medical advance directive? -     Chief Complaint  Patient presents with  . Medical Management of Chronic Issues    5 Month Follow Up   . Medication Management    Contract Update    HPI: Patient is a 86 y.o. female seen today for medical management of chronic diseases and to update opioid controlled substance contract in epic--this was done without a problem.    She went to the dermatologist.  They did give her a prescription.  She had one refill.  She has not used up the first tube yet.   Other than moving slowly from her legs bothering her, she's doing ok.  She is willing to try the exercises that therapy gave her in the past.  She has her cane.  Uses a power chair that she doesn't hardly use.  She had the pfizer vaccines #1 and 2.  2/10 was the second.    Past Medical History:  Diagnosis Date  . Anemia, unspecified   . Anorexia   . Chronic kidney disease   . Chronic kidney disease, stage III (moderate)   . Cystocele, midline   . Disorder of bone and cartilage, unspecified   . Kyphosis associated with other condition   . Obesity, unspecified   . Osteoarthrosis, unspecified whether generalized or localized, unspecified site   . Other and unspecified hyperlipidemia   . Other hammer toe (acquired)   . Postmenopausal atrophic vaginitis   . PVC's (premature ventricular contractions) 01/29/2015  . RBBB 01/29/2015  . Reflux esophagitis   . Type II or unspecified  type diabetes mellitus without mention of complication, not stated as uncontrolled   . Unspecified constipation   . Unspecified disorder of kidney and ureter   . Unspecified essential hypertension   . Unspecified hereditary and idiopathic peripheral neuropathy   . Unspecified urinary incontinence     Past Surgical History:  Procedure Laterality Date  . BLADDER SURGERY     tie up in 2013  . EYE SURGERY  11/2016   Bilateral Cataract Sx  . TUBAL LIGATION      Allergies  Allergen Reactions  . Keflex [Cephalexin]   . Metronidazole   . Simvastatin     Leg cramsps    Outpatient Encounter Medications as of 03/23/2020  Medication Sig  . aspirin 81 MG tablet Take 81 mg by mouth daily.  . calcium-vitamin D (OSCAL WITH D) 500-200 MG-UNIT per tablet Take 1 tablet by mouth daily.  . cholecalciferol (VITAMIN D) 1000 UNITS tablet Take 1,000 Units by mouth daily.  Marland Kitchen desoximetasone (TOPICORT) 0.25 % cream Apply 1 application topically 2 (two) times daily.  Marland Kitchen gabapentin (NEURONTIN) 300 MG capsule TAKE 1 CAPSULE IN THE MORNING AND TAKE 1 CAPSULE IN THE EVENING FOR PAINS  . glipiZIDE (GLUCOTROL) 5 MG tablet TAKE 1 TABLET DAILY  . glucose blood (FREESTYLE LITE) test strip Use to test blood sugar once daily. Dx: E11.21  . Lancets (FREESTYLE)  lancets Use to test blood sugar. Dx: E11.21  . lisinopril-hydrochlorothiazide (ZESTORETIC) 20-12.5 MG tablet TAKE 1 TABLET DAILY TO CONTROL BLOOD PRESSURE  . oxyCODONE-acetaminophen (ROXICET) 5-325 MG tablet Take 1 tablet by mouth every 8 (eight) hours as needed for severe pain.  . pravastatin (PRAVACHOL) 20 MG tablet TAKE 1 TABLET WITH EVENING MEAL  . TRADJENTA 5 MG TABS tablet TAKE 1 TABLET DAILY  . vitamin E 400 UNIT capsule Take 400 Units by mouth daily.   No facility-administered encounter medications on file as of 03/23/2020.    Review of Systems:  Review of Systems  Constitutional: Negative for chills, fever and malaise/fatigue.       Moving slowly    HENT: Positive for hearing loss.   Eyes: Negative for blurred vision.  Respiratory: Negative for cough and shortness of breath.   Cardiovascular: Negative for chest pain, palpitations and leg swelling.       Edema improved  Gastrointestinal: Positive for constipation and diarrhea. Negative for abdominal pain.       Bowels still bother her some, but not having fecal incontinence like at one time  Genitourinary: Negative for dysuria.  Musculoskeletal: Positive for back pain and joint pain. Negative for falls.  Skin: Negative for itching and rash.       Rash resolved with triamcinolone cream from derm  Neurological: Positive for tingling and sensory change. Negative for dizziness and loss of consciousness.  Endo/Heme/Allergies: Does not bruise/bleed easily.  Psychiatric/Behavioral: Positive for memory loss (by her report though she says she is still much better than many young than her (agree!)). Negative for depression. The patient is not nervous/anxious and does not have insomnia.     Health Maintenance  Topic Date Due  . INFLUENZA VACCINE  07/12/2020  . HEMOGLOBIN A1C  09/18/2020  . OPHTHALMOLOGY EXAM  12/03/2020  . FOOT EXAM  03/09/2021  . TETANUS/TDAP  10/01/2026  . DEXA SCAN  Completed  . PNA vac Low Risk Adult  Completed    Physical Exam: Vitals:   03/23/20 1311  BP: 127/84  Pulse: 76  Resp: 16  Temp: 97.8 F (36.6 C)  SpO2: 98%  Weight: 159 lb 3.2 oz (72.2 kg)  Height: 5\' 5"  (1.651 m)   Body mass index is 26.49 kg/m. Physical Exam Vitals reviewed.  HENT:     Head: Normocephalic and atraumatic.  Cardiovascular:     Rate and Rhythm: Normal rate and regular rhythm.     Pulses: Normal pulses.     Heart sounds: Normal heart sounds.  Pulmonary:     Effort: Pulmonary effort is normal.     Breath sounds: Normal breath sounds. No wheezing, rhonchi or rales.  Abdominal:     General: Bowel sounds are normal. There is no distension.     Palpations: Abdomen is soft.      Tenderness: There is no abdominal tenderness.  Musculoskeletal:        General: Normal range of motion.     Comments: Stooped posture, walks very slowly with cane  Skin:    General: Skin is warm and dry.  Neurological:     General: No focal deficit present.     Mental Status: She is alert and oriented to person, place, and time.     Cranial Nerves: No cranial nerve deficit.     Sensory: Sensory deficit present.     Gait: Gait abnormal.  Psychiatric:        Mood and Affect: Mood normal.  Behavior: Behavior normal.        Thought Content: Thought content normal.        Judgment: Judgment normal.     Labs reviewed: Basic Metabolic Panel: Recent Labs    03/29/19 0843 08/01/19 0836 03/19/20 1008  NA 142 142 143  K 4.1 4.2 4.4  CL 106 106 106  CO2 26 26 29   GLUCOSE 114* 110* 102*  BUN 28* 38* 34*  CREATININE 1.66* 1.59* 1.76*  CALCIUM 9.8 9.2 9.8  TSH  --   --  2.57   Liver Function Tests: Recent Labs    03/29/19 0843 08/01/19 0836 03/19/20 1008  AST 18 19 18   ALT 12 14 12   BILITOT 0.5 0.5 0.3  PROT 7.0 6.6 6.8   No results for input(s): LIPASE, AMYLASE in the last 8760 hours. No results for input(s): AMMONIA in the last 8760 hours. CBC: Recent Labs    03/29/19 0843 08/01/19 0836 03/19/20 1008  WBC 7.9 7.7 7.2  NEUTROABS 4,764 4,112 4,673  HGB 11.2* 10.9* 10.8*  HCT 34.7* 34.9* 34.8*  MCV 87.2 87.7 88.8  PLT 162 150 160   Lipid Panel: Recent Labs    08/01/19 0836 03/19/20 1008  CHOL 161 137  HDL 49* 41*  LDLCALC 95 73  TRIG 83 146  CHOLHDL 3.3 3.3   Lab Results  Component Value Date   HGBA1C 6.0 (H) 03/19/2020    Assessment/Plan 1. Type 2 diabetes mellitus with stage 4 chronic kidney disease, without long-term current use of insulin (HCC) -well controlled, must hydrate and avoid nsaids, limited on dosage of gabapentin due to this  2. Mixed hyperlipidemia -continues on pravachol -last cholesterol very close to goal and at her age,  I don't want to risk myalgias by changing her statin to higher potency Lab Results  Component Value Date   LDLCALC 73 03/19/2020    3. Benign essential HTN -bp is well controlled with current regimen and w/o dizziness, cont same regimen and monitor -ace/hctz should be held in event of poor intake/dehydration  4. Spinal stenosis, lumbar region, with neurogenic claudication -she is not sure how much different her oxycodone/pap 5/325mg  really makes when she takes it -she is going to pay attention to whether she feels better after a dose or not -she's been on this the same way for years and is likely tolerant but pain is not controlled and makes her move more slowly along with her advanced age -suggested resuming some of her prior PT exercises to see if some strengthening might help her move better (she did not want a formal PT referral until she tried to do them on her own)  5. DM type 2 with diabetic peripheral neuropathy ([place]) -well controlled with glipizide, ace, tradjenta, baby asa, pravachol, doing well w/o hypoglycemia Lab Results  Component Value Date   HGBA1C 6.0 (H) 03/19/2020    6. Pancreatic insufficiency -continues with frequent loose bms, but better than they were -creon very expensive   Labs/tests ordered:   Lab Orders     Hemoglobin A1c     Basic metabolic panel   Next appt:  07/23/2020--check MMSE at her request  Lary Eckardt L. Kanyah Matsushima, D.O. [facility] [address], [place] [address] Cell Phone (Mon-Fri 8am-5pm):  [phone] On Call:  [phone] & follow prompts after 5pm & weekends Office Phone:  [phone] Office Fax:  [phone]

## 2020-03-23 NOTE — Telephone Encounter (Signed)
I spoke to [place] from [facility] on [address] She confirmed that patient "Dawn [name]" was seen by them and that Dr.Susan Stinehelfer was the provider for this patient. Lelon Frohlich states that she will have papers faxed over to [place] and that if we don't receive papers by tomorrow morning to give a call back.

## 2020-03-23 NOTE — Telephone Encounter (Signed)
Yes patient is ok with the precision machine, because Reli-on in [employer] brand.

## 2020-03-23 NOTE — Telephone Encounter (Signed)
Pt called back and gave the test strips, lancets and machine, pt is using Relion, which is a [employer] brand, I advised pt we already sent in Precision. Also pt said the cream is triamcinolone 0.1% cream, medication added to chart.

## 2020-03-23 NOTE — Patient Instructions (Addendum)
Try doing your therapy exercises that were given to you in the past.  If you are not having improvement, let me know.    Keep an eye on your pain levels before your medicine and about an hour afterward.  Let me know where things stand.  I can make adjustments if needed.    Let us know which glucometer it is you like so we can send in the machine order and orders for strips and lancets.

## 2020-04-08 ENCOUNTER — Other Ambulatory Visit: Payer: Self-pay | Admitting: Internal Medicine

## 2020-04-08 DIAGNOSIS — M48062 Spinal stenosis, lumbar region with neurogenic claudication: Secondary | ICD-10-CM

## 2020-04-08 NOTE — Telephone Encounter (Signed)
Routing to Dr. Mariea Clonts

## 2020-04-15 ENCOUNTER — Other Ambulatory Visit: Payer: Self-pay | Admitting: Internal Medicine

## 2020-04-15 DIAGNOSIS — E1121 Type 2 diabetes mellitus with diabetic nephropathy: Secondary | ICD-10-CM

## 2020-04-15 NOTE — Telephone Encounter (Signed)
rx sent to pharmacy by e-script  

## 2020-05-08 ENCOUNTER — Encounter: Payer: Self-pay | Admitting: Family

## 2020-05-08 ENCOUNTER — Encounter: Payer: Medicare Other | Admitting: Family

## 2020-05-08 ENCOUNTER — Ambulatory Visit (INDEPENDENT_AMBULATORY_CARE_PROVIDER_SITE_OTHER): Payer: Medicare Other | Admitting: Family

## 2020-05-08 ENCOUNTER — Other Ambulatory Visit: Payer: Self-pay

## 2020-05-08 DIAGNOSIS — Z0000 Encounter for general adult medical examination without abnormal findings: Secondary | ICD-10-CM | POA: Diagnosis not present

## 2020-05-08 NOTE — Progress Notes (Signed)
    This service is provided via telemedicine  No vital signs collected/recorded due to the encounter was a telemedicine visit.   Location of patient (ex: home, work): Home.  Patient consents to a telephone visit: Yes.  Location of the provider (ex: office, home):  [facility].  Name of any referring provider: N/A  Names of all persons participating in the telemedicine service and their role in the encounter:  Patient, Dawn [name], RMA, [name], [name], NP.    Time spent on call: 8 minutes spent on the phone with Medical Assistant.   

## 2020-05-08 NOTE — Progress Notes (Signed)
Subjective:   Dawn [name] is a 86 y.o. female who presents for Medicare Annual (Subsequent) preventive examination.  Review of Systems:  Cardiac Risk Factors include: advanced age (>50mn, >>56women);diabetes mellitus;hypertension;dyslipidemia;smoking/ tobacco exposure     Objective:     Vitals: There were no vitals taken for this visit.  There is no height or weight on file to calculate BMI.  Advanced Directives 05/08/2020 03/23/2020 09/26/2019 05/08/2019 11/12/2018 11/12/2018 07/02/2018  Does Patient Have a Medical Advance Directive? Yes Yes No No - Yes Yes  Type of Advance Directive Living will;Healthcare Power of Attorney Living will;Healthcare Power of APoloLiving will  Does patient want to make changes to medical advance directive? No - Guardian declined - - - - No - Patient declined -  Copy of [place] Chart? No - copy requested No - copy requested - - - - -  Would patient like information on creating a medical advance directive? - - Yes (ED - Information included in AVS) Yes (ED - Information included in AVS) - - -    Tobacco Social History   Tobacco Use  Smoking Status Former Smoker  . Quit date: 12/12/1968  . Years since quitting: 51.4  Smokeless Tobacco Never Used  Tobacco Comment   quit in 1970     Counseling given: Not Answered Comment: quit in 1970   Clinical Intake:  Pre-visit preparation completed: No  Pain : 0-10 Pain Score: 4  Pain Type: Chronic pain Pain Location: Leg(feet) Pain Orientation: Left, Right Pain Radiating Towards: no Pain Descriptors / Indicators: Aching, Burning Pain Onset: Other (comment)(several years) Pain Relieving Factors: tylenol,Gabapentin,oxycodone Effect of Pain on Daily Activities: slows me down  Pain Relieving Factors: tylenol,Gabapentin,oxycodone  BMI - recorded: 26.49 Nutritional Status: BMI 25 -29  Overweight Nutritional Risks: None Diabetes: Yes CBG done?: No Did pt. bring in CBG monitor from home?: Yes(recall 100- 110's) Glucose Meter Downloaded?: No(recalls reading)  How often do you need to have someone help you when you read instructions, pamphlets, or other written materials from your doctor or pharmacy?: 1 - Never What is the last grade level you completed in school?: Bachelors Degree in Business  Interpreter Needed?: No  Information entered by :: Sanel Stemmer FNP-C  Past Medical History:  Diagnosis Date  . Anemia, unspecified   . Anorexia   . Chronic kidney disease   . Chronic kidney disease, stage III (moderate)   . Cystocele, midline   . Disorder of bone and cartilage, unspecified   . Kyphosis associated with other condition   . Obesity, unspecified   . Osteoarthrosis, unspecified whether generalized or localized, unspecified site   . Other and unspecified hyperlipidemia   . Other hammer toe (acquired)   . Postmenopausal atrophic vaginitis   . PVC's (premature ventricular contractions) 01/29/2015  . RBBB 01/29/2015  . Reflux esophagitis   . Type II or unspecified type diabetes mellitus without mention of complication, not stated as uncontrolled   . Unspecified constipation   . Unspecified disorder of kidney and ureter   . Unspecified essential hypertension   . Unspecified hereditary and idiopathic peripheral neuropathy   . Unspecified urinary incontinence    Past Surgical History:  Procedure Laterality Date  . BLADDER SURGERY     tie up in 2013  . EYE SURGERY  11/2016   Bilateral Cataract Sx  . TUBAL LIGATION     Family History  Problem Relation Age of Onset  . Heart disease Mother   . Heart disease Sister   . Cancer Son   . Cancer Son   . Stomach cancer Neg Hx   . Colon cancer Neg Hx   . Pancreatic cancer Neg Hx    Social History   Socioeconomic History  . Marital status: Widowed    Spouse name: Not on file  . Number of children: 5  .  Years of education: 16  . Highest education level: Bachelor's degree (e.g., BA, AB, BS)  Occupational History  . Occupation: retired Occupational psychologist  Tobacco Use  . Smoking status: Former Smoker    Quit date: 12/12/1968    Years since quitting: 51.4  . Smokeless tobacco: Never Used  . Tobacco comment: quit in 1970  Substance and Sexual Activity  . Alcohol use: No    Alcohol/week: 0.0 standard drinks  . Drug use: No  . Sexual activity: Never  Other Topics Concern  . Not on file  Social History Narrative   Widow - husband died 65   Lives one story house -son and grandson lives with her   Former smoker -stopped 1970   Alcohol none   Exercise -some walking   POA, Living Will   Social Determinants of Health   Financial Resource Strain:   . Difficulty of Paying Living Expenses:   Food Insecurity:   . Worried About Charity fundraiser in the Last Year:   . Arboriculturist in the Last Year:   Transportation Needs:   . Film/video editor (Medical):   Marland Kitchen Lack of Transportation (Non-Medical):   Physical Activity:   . Days of Exercise per Week:   . Minutes of Exercise per Session:   Stress:   . Feeling of Stress :   Social Connections:   . Frequency of Communication with Friends and Family:   . Frequency of Social Gatherings with Friends and Family:   . Attends Religious Services:   . Active Member of Clubs or Organizations:   . Attends Archivist Meetings:   Marland Kitchen Marital Status:     Outpatient Encounter Medications as of 05/08/2020  Medication Sig  . aspirin 81 MG tablet Take 81 mg by mouth daily.  . Blood Glucose Monitoring Suppl (PRECISION XTRA) w/Device KIT 1 strip by In Vitro route daily. DX: E11.29 E11.65  . calcium-vitamin D (OSCAL WITH D) 500-200 MG-UNIT per tablet Take 1 tablet by mouth daily.  . cholecalciferol (VITAMIN D) 1000 UNITS tablet Take 1,000 Units by mouth daily.  Marland Kitchen desoximetasone (TOPICORT) 0.25 % cream Apply 1 application topically 2  (two) times daily.  Marland Kitchen gabapentin (NEURONTIN) 300 MG capsule TAKE 1 CAPSULE IN THE MORNING AND TAKE 1 CAPSULE IN THE EVENING FOR PAINS  . glipiZIDE (GLUCOTROL) 5 MG tablet TAKE 1 TABLET DAILY  . glucose blood (PRECISION QID TEST) test strip Precision ultra test strips Use as instructed to test blood sugar once daily DX: E11.29 E11.65  . Lancets (PRECISION ULTRA LANCET) MISC Use as instructed to test blood sugar once daily DX: E11.29 E11.65  . lisinopril-hydrochlorothiazide (ZESTORETIC) 20-12.5 MG tablet TAKE 1 TABLET DAILY TO CONTROL BLOOD PRESSURE  . oxyCODONE-acetaminophen (PERCOCET/ROXICET) 5-325 MG tablet TAKE 1 TABLET EVERY 8 HOURS AS NEEDED FOR SEVERE PAIN.  Marland Kitchen pravastatin (PRAVACHOL) 20 MG tablet TAKE 1 TABLET WITH EVENING MEAL  . TRADJENTA 5 MG TABS tablet TAKE 1 TABLET DAILY  . triamcinolone cream (KENALOG) 0.1 % Apply  1 application topically 2 (two) times daily.  . vitamin E 400 UNIT capsule Take 400 Units by mouth daily.   No facility-administered encounter medications on file as of 05/08/2020.    Activities of Daily Living In your present state of health, do you have any difficulty performing the following activities: 05/08/2020  Hearing? Y  Comment has hearing aids but doesn't wear  Vision? N  Difficulty concentrating or making decisions? Y  Comment Remembering  Walking or climbing stairs? N  Dressing or bathing? N  Doing errands, shopping? N  Comment children Restaurant manager, fast food and eating ? N  Using the Toilet? N  In the past six months, have you accidently leaked urine? Y  Comment a lillte sometimes wears a pad  Do you have problems with loss of bowel control? N  Managing your Medications? N  Managing your Finances? N  Housekeeping or managing your Housekeeping? N  Comment family assist  Some recent data might be hidden    Patient Care Team: Gayland Curry, DO as PCP - General (Geriatric Medicine) Clent Jacks, MD as Consulting Physician  (Ophthalmology) Sydnee Levans, MD as Referring Physician (Dermatology)    Assessment:   This is a routine wellness examination for Dawn [name].  Exercise Activities and Dietary recommendations Current Exercise Habits: Home exercise routine, Type of exercise: walking;stretching, Time (Minutes): 15, Frequency (Times/Week): 5, Weekly Exercise (Minutes/Week): 75, Intensity: Mild, Exercise limited by: None identified  Goals    . <enter goal here>     Starting 02/02/17, I will maintain my current lifestyle.        Fall Risk Fall Risk  05/08/2020 03/23/2020 10/29/2019 10/21/2019 10/14/2019  Falls in the past year? 0 0 0 0 0  Number falls in past yr: 0 0 0 0 0  Injury with Fall? 0 0 0 0 0  Comment - - - - -  Risk for fall due to : - - - - -  Follow up - - - - -   Is the patient's home free of loose throw rugs in walkways, pet beds, electrical cords, etc?   no      Grab bars in the bathroom? no      Handrails on the stairs?   no      Adequate lighting?   yes  Depression Screen PHQ 2/9 Scores 05/08/2020 08/08/2019 05/08/2019 04/04/2019  PHQ - 2 Score 0 0 0 0     Cognitive Function MMSE - Mini Mental State Exam 02/08/2018 02/02/2017 02/01/2016 01/06/2015  Orientation to time 5 5 5 5   Orientation to Place 5 5 5 5   Registration 3 3 3 3   Attention/ Calculation 5 5 5 5   Recall 3 3 3 2   Language- name 2 objects 2 2 2 2   Language- repeat 1 1 1 1   Language- follow 3 step command 3 3 3 3   Language- read & follow direction 1 1 1 1   Write a sentence 1 1 1 1   Copy design 1 1 1 1   Total score 30 30 30 29      6CIT Screen 05/08/2020 05/08/2019  What Year? 0 points 0 points  What month? 0 points 0 points  What time? 0 points 0 points  Count back from 20 2 points 0 points  Months in reverse 0 points 0 points  Repeat phrase 8 points 6 points  Total Score 10 6    Immunization History  Administered Date(s) Administered  . Fluad Quad(high Dose 65+) 08/08/2019  .  Influenza, High Dose Seasonal PF  09/11/2018  . Influenza,inj,Quad PF,6+ Mos 10/08/2013, 09/30/2014  . Influenza-Unspecified 10/17/2012, 09/12/2015, 10/01/2016, 09/23/2017  . Pneumococcal Conjugate-13 10/31/1997, 07/29/2014  . Pneumococcal Polysaccharide-23 07/30/2015  . Tdap 10/31/1997, 03/12/2014, 10/01/2016  . Zoster 07/30/2014    Qualifies for Shingles Vaccine? Declined   Screening Tests Health Maintenance  Topic Date Due  . COVID-19 Vaccine (1) Never done  . INFLUENZA VACCINE  07/12/2020  . HEMOGLOBIN A1C  09/18/2020  . OPHTHALMOLOGY EXAM  12/03/2020  . FOOT EXAM  03/09/2021  . TETANUS/TDAP  10/01/2026  . DEXA SCAN  Completed  . PNA vac Low Risk Adult  Completed    Cancer Screenings: Lung: Low Dose CT Chest recommended if Age 83-80 years, 30 pack-year currently smoking OR have quit w/in 15years. Patient does not qualify. Breast:  Up to date on Mammogram? Yes   Up to date of Bone Density/Dexa? Yes Colorectal:Aged out   Additional Screenings: Hepatitis C Screening: Low Risk      Plan:  - Advised to get Shingrix vaccine at the pharmacy Yes   I have personally reviewed and noted the following in the patient's chart:   . Medical and social history . Use of alcohol, tobacco or illicit drugs  . Current medications and supplements . Functional ability and status . Nutritional status . Physical activity . Advanced directives . List of other physicians . Hospitalizations, surgeries, and ER visits in previous 12 months . Vitals . Screenings to include cognitive, depression, and falls . Referrals and appointments  In addition, I have reviewed and discussed with patient certain preventive protocols, quality metrics, and best practice recommendations. A written personalized care plan for preventive services as well as general preventive health recommendations were provided to patient.     Sandrea Hughs, NP  05/08/2020

## 2020-05-08 NOTE — Patient Instructions (Signed)
Dawn [name] , Thank you for taking time to come for your Medicare Wellness Visit. I appreciate your ongoing commitment to your health goals. Please review the following plan we discussed and let me know if I can assist you in the future.   Screening recommendations/referrals: Colonoscopy: Age out  Mammogram: Up to date  Bone Density : Up to date Recommended yearly ophthalmology/optometry visit for glaucoma screening and checkup Recommended yearly dental visit for hygiene and checkup  Vaccinations: Influenza vaccine: Up to date Pneumococcal vaccine : Up to date Tdap vaccine : Up to date due next 10/01/2026  Shingles vaccine: Please get Shingrix vaccine at your pharmacy   Advanced directives: Yes   Conditions/risks identified: Advanced age female > 6 yrs,hypertension,Type 2 Diabetes,dyslipidemia and Hx of smoking   Next appointment: 1 year    Preventive Care 32 Years and Older, Female Preventive care refers to lifestyle choices and visits with your health care provider that can promote health and wellness. What does preventive care include?  A yearly physical exam. This is also called an annual well check.  Dental exams once or twice a year.  Routine eye exams. Ask your health care provider how often you should have your eyes checked.  Personal lifestyle choices, including:  Daily care of your teeth and gums.  Regular physical activity.  Eating a healthy diet.  Avoiding tobacco and drug use.  Limiting alcohol use.  Practicing safe sex.  Taking low-dose aspirin every day.  Taking vitamin and mineral supplements as recommended by your health care provider. What happens during an annual well check? The services and screenings done by your health care provider during your annual well check will depend on your age, overall health, lifestyle risk factors, and family history of disease. Counseling  Your health care provider may ask you questions about your:  Alcohol  use.  Tobacco use.  Drug use.  Emotional well-being.  Home and relationship well-being.  Sexual activity.  Eating habits.  History of falls.  Memory and ability to understand (cognition).  Work and work Statistician.  Reproductive health. Screening  You may have the following tests or measurements:  Height, weight, and BMI.  Blood pressure.  Lipid and cholesterol levels. These may be checked every 5 years, or more frequently if you are over 75 years old.  Skin check.  Lung cancer screening. You may have this screening every year starting at age 30 if you have a 30-pack-year history of smoking and currently smoke or have quit within the past 15 years.  Fecal occult blood test (FOBT) of the stool. You may have this test every year starting at age 37.  Flexible sigmoidoscopy or colonoscopy. You may have a sigmoidoscopy every 5 years or a colonoscopy every 10 years starting at age 9.  Hepatitis C blood test.  Hepatitis B blood test.  Sexually transmitted disease (STD) testing.  Diabetes screening. This is done by checking your blood sugar (glucose) after you have not eaten for a while (fasting). You may have this done every 1-3 years.  Bone density scan. This is done to screen for osteoporosis. You may have this done starting at age 70.  Mammogram. This may be done every 1-2 years. Talk to your health care provider about how often you should have regular mammograms. Talk with your health care provider about your test results, treatment options, and if necessary, the need for more tests. Vaccines  Your health care provider may recommend certain vaccines, such as:  Influenza vaccine.  This is recommended every year.  Tetanus, diphtheria, and acellular pertussis (Tdap, Td) vaccine. You may need a Td booster every 10 years.  Zoster vaccine. You may need this after age 67.  Pneumococcal 13-valent conjugate (PCV13) vaccine. One dose is recommended after age  71.  Pneumococcal polysaccharide (PPSV23) vaccine. One dose is recommended after age 86. Talk to your health care provider about which screenings and vaccines you need and how often you need them. This information is not intended to replace advice given to you by your health care provider. Make sure you discuss any questions you have with your health care provider. Document Released: 12/25/2015 Document Revised: 08/17/2016 Document Reviewed: 09/29/2015 Elsevier Interactive Patient Education  2017 [employer] Prevention in the Home Falls can cause injuries. They can happen to people of all ages. There are many things you can do to make your home safe and to help prevent falls. What can I do on the outside of my home?  Regularly fix the edges of walkways and driveways and fix any cracks.  Remove anything that might make you trip as you walk through a door, such as a raised step or threshold.  Trim any bushes or trees on the path to your home.  Use bright outdoor lighting.  Clear any walking paths of anything that might make someone trip, such as rocks or tools.  Regularly check to see if handrails are loose or broken. Make sure that both sides of any steps have handrails.  Any raised decks and porches should have guardrails on the edges.  Have any leaves, snow, or ice cleared regularly.  Use sand or salt on walking paths during winter.  Clean up any spills in your garage right away. This includes oil or grease spills. What can I do in the bathroom?  Use night lights.  Install grab bars by the toilet and in the tub and shower. Do not use towel bars as grab bars.  Use non-skid mats or decals in the tub or shower.  If you need to sit down in the shower, use a plastic, non-slip stool.  Keep the floor dry. Clean up any water that spills on the floor as soon as it happens.  Remove soap buildup in the tub or shower regularly.  Attach bath mats securely with double-sided  non-slip rug tape.  Do not have throw rugs and other things on the floor that can make you trip. What can I do in the bedroom?  Use night lights.  Make sure that you have a light by your bed that is easy to reach.  Do not use any sheets or blankets that are too big for your bed. They should not hang down onto the floor.  Have a firm chair that has side arms. You can use this for support while you get dressed.  Do not have throw rugs and other things on the floor that can make you trip. What can I do in the kitchen?  Clean up any spills right away.  Avoid walking on wet floors.  Keep items that you use a lot in easy-to-reach places.  If you need to reach something above you, use a strong step stool that has a grab bar.  Keep electrical cords out of the way.  Do not use floor polish or wax that makes floors slippery. If you must use wax, use non-skid floor wax.  Do not have throw rugs and other things on the floor that can make  you trip. What can I do with my stairs?  Do not leave any items on the stairs.  Make sure that there are handrails on both sides of the stairs and use them. Fix handrails that are broken or loose. Make sure that handrails are as long as the stairways.  Check any carpeting to make sure that it is firmly attached to the stairs. Fix any carpet that is loose or worn.  Avoid having throw rugs at the top or bottom of the stairs. If you do have throw rugs, attach them to the floor with carpet tape.  Make sure that you have a light switch at the top of the stairs and the bottom of the stairs. If you do not have them, ask someone to add them for you. What else can I do to help prevent falls?  Wear shoes that:  Do not have high heels.  Have rubber bottoms.  Are comfortable and fit you well.  Are closed at the toe. Do not wear sandals.  If you use a stepladder:  Make sure that it is fully opened. Do not climb a closed stepladder.  Make sure that both  sides of the stepladder are locked into place.  Ask someone to hold it for you, if possible.  Clearly mark and make sure that you can see:  Any grab bars or handrails.  First and last steps.  Where the edge of each step is.  Use tools that help you move around (mobility aids) if they are needed. These include:  Canes.  Walkers.  Scooters.  Crutches.  Turn on the lights when you go into a dark area. Replace any light bulbs as soon as they burn out.  Set up your furniture so you have a clear path. Avoid moving your furniture around.  If any of your floors are uneven, fix them.  If there are any pets around you, be aware of where they are.  Review your medicines with your doctor. Some medicines can make you feel dizzy. This can increase your chance of falling. Ask your doctor what other things that you can do to help prevent falls. This information is not intended to replace advice given to you by your health care provider. Make sure you discuss any questions you have with your health care provider. Document Released: 09/24/2009 Document Revised: 05/05/2016 Document Reviewed: 01/02/2015 Elsevier Interactive Patient Education  2017 [employer].

## 2020-05-22 ENCOUNTER — Other Ambulatory Visit: Payer: Self-pay | Admitting: *Deleted

## 2020-05-22 DIAGNOSIS — M48062 Spinal stenosis, lumbar region with neurogenic claudication: Secondary | ICD-10-CM

## 2020-05-22 MED ORDER — OXYCODONE-ACETAMINOPHEN 5-325 MG PO TABS: ORAL_TABLET | ORAL | 0 refills | Status: DC

## 2020-05-22 NOTE — Telephone Encounter (Signed)
Patient requested refill [place] Database Verified LR: 04/08/2020 Contract updated Pended Rx and sent to Dr. Reed for approval.  

## 2020-05-25 NOTE — Telephone Encounter (Signed)
Patient called stating she tried last week to get her RX filled and is trying again. [place] to confirm order was received. Wendi states the Rx is ready for patient to pick up. DWP. No further questions.

## 2020-06-09 ENCOUNTER — Encounter: Payer: Self-pay | Admitting: Podiatry

## 2020-06-09 ENCOUNTER — Other Ambulatory Visit: Payer: Self-pay

## 2020-06-09 ENCOUNTER — Ambulatory Visit (INDEPENDENT_AMBULATORY_CARE_PROVIDER_SITE_OTHER): Payer: Medicare Other | Admitting: Podiatry

## 2020-06-09 DIAGNOSIS — M79676 Pain in unspecified toe(s): Secondary | ICD-10-CM

## 2020-06-09 DIAGNOSIS — B351 Tinea unguium: Secondary | ICD-10-CM

## 2020-06-09 DIAGNOSIS — E1142 Type 2 diabetes mellitus with diabetic polyneuropathy: Secondary | ICD-10-CM

## 2020-06-09 DIAGNOSIS — L84 Corns and callosities: Secondary | ICD-10-CM

## 2020-06-14 NOTE — Progress Notes (Signed)
Subjective:  Patient ID: Dawn [name], female    DOB: 13-Jul-1928,  MRN: [id number]  86 y.o. female presents with at risk foot care. Pt has h/o NIDDM with chronic kidney disease and painful corn(s) right 5th digit and painful thick toenails that are difficult to trim. Pain interferes with ambulation. Aggravating factors include wearing enclosed shoe gear. Pain is relieved with periodic professional debridement.   She voices no new pedal problems on today's visit.  Review of Systems: Negative except as noted in the HPI. Denies N/V/F/Ch. Past Medical History:  Diagnosis Date  . Anemia, unspecified   . Anorexia   . Chronic kidney disease   . Chronic kidney disease, stage III (moderate)   . Cystocele, midline   . Disorder of bone and cartilage, unspecified   . Kyphosis associated with other condition   . Obesity, unspecified   . Osteoarthrosis, unspecified whether generalized or localized, unspecified site   . Other and unspecified hyperlipidemia   . Other hammer toe (acquired)   . Postmenopausal atrophic vaginitis   . PVC's (premature ventricular contractions) 01/29/2015  . RBBB 01/29/2015  . Reflux esophagitis   . Type II or unspecified type diabetes mellitus without mention of complication, not stated as uncontrolled   . Unspecified constipation   . Unspecified disorder of kidney and ureter   . Unspecified essential hypertension   . Unspecified hereditary and idiopathic peripheral neuropathy   . Unspecified urinary incontinence    Past Surgical History:  Procedure Laterality Date  . BLADDER SURGERY     tie up in 2013  . EYE SURGERY  11/2016   Bilateral Cataract Sx  . TUBAL LIGATION     Patient Active Problem List   Diagnosis Date Noted  . Colitis 11/26/2018  . Advance care planning 11/20/2017  . PVC's (premature ventricular contractions) 01/29/2015  . RBBB 01/29/2015  . Mixed hyperlipidemia 01/06/2015  . Osteopenia 01/06/2015  . Benign essential HTN 07/29/2014  . Weight  gain 07/29/2014  . Therapeutic opioid induced constipation 07/29/2014  . Spinal stenosis, lumbar region, with neurogenic claudication 04/30/2014  . Neuropathic pain 03/05/2014  . Bilateral leg pain 02/05/2014  . DM type 2 with diabetic peripheral neuropathy ([place]) 02/05/2014  . Unspecified constipation 01/08/2014  . Leg pain 01/08/2014  . Routine general medical examination at a health care facility 01/08/2014  . Type 2 diabetes mellitus with stage 4 chronic kidney disease, without long-term current use of insulin ([place]) 10/08/2013  . Hypertensive renal disease 10/08/2013  . Need for prophylactic vaccination and inoculation against influenza 10/08/2013  . Generalized OA 07/23/2013  . UTI (urinary tract infection) 06/19/2013  . Hypoglycemia 06/19/2013  . Diabetes mellitus with nephropathy ([place]) 03/21/2013  . Osteoarthritis   . Other and unspecified hyperlipidemia   . Chronic kidney disease     Current Outpatient Medications:  .  aspirin 81 MG tablet, Take 81 mg by mouth daily., Disp: , Rfl:  .  Blood Glucose Monitoring Suppl (PRECISION XTRA) w/Device KIT, 1 strip by In Vitro route daily. DX: E11.29 E11.65, Disp: 1 kit, Rfl: 0 .  calcium-vitamin D (OSCAL WITH D) 500-200 MG-UNIT per tablet, Take 1 tablet by mouth daily., Disp: , Rfl:  .  cholecalciferol (VITAMIN D) 1000 UNITS tablet, Take 1,000 Units by mouth daily., Disp: , Rfl:  .  desoximetasone (TOPICORT) 0.25 % cream, Apply 1 application topically 2 (two) times daily., Disp: 30 g, Rfl: 1 .  gabapentin (NEURONTIN) 300 MG capsule, TAKE 1 CAPSULE IN THE MORNING AND TAKE 1  CAPSULE IN THE EVENING FOR PAINS, Disp: 180 capsule, Rfl: 3 .  glipiZIDE (GLUCOTROL) 5 MG tablet, TAKE 1 TABLET DAILY, Disp: 90 tablet, Rfl: 3 .  glucose blood (PRECISION QID TEST) test strip, Precision ultra test strips Use as instructed to test blood sugar once daily DX: E11.29 E11.65, Disp: 100 each, Rfl: 1 .  Lancets (PRECISION ULTRA LANCET) MISC, Use as instructed to  test blood sugar once daily DX: E11.29 E11.65, Disp: 100 each, Rfl: 1 .  lisinopril-hydrochlorothiazide (ZESTORETIC) 20-12.5 MG tablet, TAKE 1 TABLET DAILY TO CONTROL BLOOD PRESSURE, Disp: 90 tablet, Rfl: 3 .  oxyCODONE-acetaminophen (PERCOCET/ROXICET) 5-325 MG tablet, Take one tablet by mouth every 8 hours as needed for severe pain., Disp: 90 tablet, Rfl: 0 .  pravastatin (PRAVACHOL) 20 MG tablet, TAKE 1 TABLET WITH EVENING MEAL, Disp: 90 tablet, Rfl: 1 .  TRADJENTA 5 MG TABS tablet, TAKE 1 TABLET DAILY, Disp: 90 tablet, Rfl: 3 .  triamcinolone cream (KENALOG) 0.1 %, Apply 1 application topically 2 (two) times daily., Disp: , Rfl:  .  vitamin E 400 UNIT capsule, Take 400 Units by mouth daily., Disp: , Rfl:  Allergies  Allergen Reactions  . Keflex [Cephalexin]   . Metronidazole   . Simvastatin     Leg cramsps   Social History   Tobacco Use  Smoking Status Former Smoker  . Quit date: 12/12/1968  . Years since quitting: 51.5  Smokeless Tobacco Never Used  Tobacco Comment   quit in 1970   Objective:  There were no vitals filed for this visit. Constitutional Patient is a pleasant 86 y.o. African American female, WD, WN in NAD.Marland Kitchen  Vascular Neurovascular status unchanged b/l lower extremities. Capillary fill time to digits <3 seconds b/l lower extremities. Faintly palpable pedal pulses b/l. Pedal hair absent. Lower extremity skin temperature gradient within normal limits. No pain with calf compression b/l. No edema noted b/l lower extremities. No cyanosis or clubbing noted.  Neurologic Normal speech. Oriented to person, place, and time. Protective sensation diminished with 10g monofilament b/l. Vibratory sensation diminished b/l.  Dermatologic Pedal skin with normal turgor, texture and tone bilaterally. No open wounds bilaterally. No interdigital macerations bilaterally. Toenails 1-5 b/l elongated, discolored, dystrophic, thickened, crumbly with subungual debris and tenderness to dorsal  palpation. Hyperkeratotic lesion(s) b/l lower extremities, R 5th toe, submet head 5 left foot and submet head 5 right foot.  No erythema, no edema, no drainage, no flocculence. Evidence of chronic venous insufficiency b/l LE. Skin changes consistent with venous stasis noted b/l LE.  Orthopedic: Normal muscle strength 5/5 to all lower extremity muscle groups bilaterally. No pain crepitus or joint limitation noted with ROM b/l. Hallux valgus with bunion deformity noted b/l lower extremities. Hammertoes noted to the b/l lower extremities. Pes planus deformity noted b/l.  Wearing diabetic shoes today.   Radiographs: None Assessment:   1. Pain due to onychomycosis of nail   2. Corns and callosities   3. Diabetic peripheral neuropathy associated with type 2 diabetes mellitus ([place])    Plan:  Patient was evaluated and treated and all questions answered.  Onychomycosis with pain -Nails palliatively debridement as below. -Educated on self-care  Procedure: Nail Debridement Rationale: Pain Type of Debridement: manual, sharp debridement. Instrumentation: Nail nipper, rotary burr. Number of Nails: 10  -Examined patient. -Continue diabetic foot care principles. -Toenails 1-5 b/l were debrided in length and girth with sterile nail nippers and dremel without iatrogenic bleeding.  -Corn(s) R 5th toe and callus(es) submet head 5 left  foot and submet head 5 right foot were pared utilizing sterile scalpel blade without incident. Total number debrided =3.  Return in about 3 months (around 09/09/2020) for diabetic nail and callus trim.  Marzetta Board, DPM

## 2020-07-14 ENCOUNTER — Other Ambulatory Visit: Payer: Self-pay | Admitting: *Deleted

## 2020-07-14 DIAGNOSIS — M48062 Spinal stenosis, lumbar region with neurogenic claudication: Secondary | ICD-10-CM

## 2020-07-14 MED ORDER — OXYCODONE-ACETAMINOPHEN 5-325 MG PO TABS: ORAL_TABLET | ORAL | 0 refills | Status: DC

## 2020-07-14 NOTE — Telephone Encounter (Signed)
Patient requested refill Contract updated Epic LR: 05/22/20 Pended Rx and sent to Dr. Mariea Clonts for approval.

## 2020-07-17 ENCOUNTER — Other Ambulatory Visit: Payer: Medicare Other

## 2020-07-20 ENCOUNTER — Other Ambulatory Visit: Payer: Medicare Other

## 2020-07-23 ENCOUNTER — Ambulatory Visit: Payer: Medicare Other | Admitting: Internal Medicine

## 2020-07-28 ENCOUNTER — Other Ambulatory Visit: Payer: Medicare Other

## 2020-07-28 ENCOUNTER — Other Ambulatory Visit: Payer: Self-pay

## 2020-07-28 DIAGNOSIS — E1142 Type 2 diabetes mellitus with diabetic polyneuropathy: Secondary | ICD-10-CM

## 2020-07-28 DIAGNOSIS — I10 Essential (primary) hypertension: Secondary | ICD-10-CM

## 2020-07-28 DIAGNOSIS — E1122 Type 2 diabetes mellitus with diabetic chronic kidney disease: Secondary | ICD-10-CM

## 2020-07-29 ENCOUNTER — Other Ambulatory Visit: Payer: Self-pay

## 2020-07-29 ENCOUNTER — Encounter: Payer: Self-pay | Admitting: Internal Medicine

## 2020-07-29 LAB — BASIC METABOLIC PANEL
BUN/Creatinine Ratio: 20 (calc) (ref 6–22)
BUN: 39 mg/dL — ABNORMAL HIGH (ref 7–25)
CO2: 23 mmol/L (ref 20–32)
Calcium: 9.4 mg/dL (ref 8.6–10.4)
Chloride: 106 mmol/L (ref 98–110)
Creat: 1.94 mg/dL — ABNORMAL HIGH (ref 0.60–0.88)
Glucose, Bld: 88 mg/dL (ref 65–99)
Potassium: 4.1 mmol/L (ref 3.5–5.3)
Sodium: 141 mmol/L (ref 135–146)

## 2020-07-29 LAB — HEMOGLOBIN A1C
Hgb A1c MFr Bld: 5.8 % of total Hgb — ABNORMAL HIGH (ref <5.7)
Mean Plasma Glucose: 120 (calc)
eAG (mmol/L): 6.6 (calc)

## 2020-07-29 NOTE — Progress Notes (Signed)
Sugar average has improved.   Kidney function has worsened though.  I'm wondering about how she's doing drinking water.  Is she taking nsaids orally at all?   I'd like her to hold off on taking her lisinopril/zestoretic until I see her and elevate her feet at rest, wear her compression socks.  We will need to recheck her kidneys (BMP) at her appt 8/26.  If she starts having a problem with shortness of breath, she should call us right away during that time.

## 2020-08-06 ENCOUNTER — Encounter: Payer: Self-pay | Admitting: Internal Medicine

## 2020-08-06 ENCOUNTER — Other Ambulatory Visit: Payer: Self-pay

## 2020-08-06 ENCOUNTER — Ambulatory Visit (INDEPENDENT_AMBULATORY_CARE_PROVIDER_SITE_OTHER): Payer: Medicare Other | Admitting: Internal Medicine

## 2020-08-06 VITALS — BP 118/62 | HR 74 | Temp 97.7°F | Ht 65.0 in | Wt 151.0 lb

## 2020-08-06 DIAGNOSIS — I10 Essential (primary) hypertension: Secondary | ICD-10-CM

## 2020-08-06 DIAGNOSIS — N179 Acute kidney failure, unspecified: Secondary | ICD-10-CM

## 2020-08-06 DIAGNOSIS — N184 Chronic kidney disease, stage 4 (severe): Secondary | ICD-10-CM | POA: Diagnosis not present

## 2020-08-06 DIAGNOSIS — E1122 Type 2 diabetes mellitus with diabetic chronic kidney disease: Secondary | ICD-10-CM | POA: Diagnosis not present

## 2020-08-06 DIAGNOSIS — Z7189 Other specified counseling: Secondary | ICD-10-CM

## 2020-08-06 DIAGNOSIS — E782 Mixed hyperlipidemia: Secondary | ICD-10-CM

## 2020-08-06 DIAGNOSIS — M48062 Spinal stenosis, lumbar region with neurogenic claudication: Secondary | ICD-10-CM | POA: Diagnosis not present

## 2020-08-06 NOTE — Progress Notes (Signed)
Location:  [facility] clinic Provider:  Deneene Tarver L. Mariea Clonts, D.O., C.M.D.  Goals of Care:  Advanced Directives 08/06/2020  Does Patient Have a Medical Advance Directive? Yes  Type of Advance Directive Living will  Does patient want to make changes to medical advance directive? No - Patient declined  Copy of [place] in Chart? -  Would patient like information on creating a medical advance directive? -     Chief Complaint  Patient presents with  . Medical Management of Chronic Issues    4 month follow up ...   . Health Maintenance    influenza high dose is out of stock     HPI: Patient is a 86 y.o. [name] seen today for medical management of chronic diseases.    Legs bother her.  That's going on some time now.  Not new.  Kids pick on her for not eating a lot.  Down 8 lbs in 4 months.  She thinks she is eating enough protein.  She is not eating fattening food.  Discussed supplement shakes.    Back doesn't bother her that much.  The legs and thighs pain a lot.  She has a fear of being addicted to her percocet.    BP was still good without lisinopril/hctz.  Past Medical History:  Diagnosis Date  . Anemia, unspecified   . Anorexia   . Chronic kidney disease   . Chronic kidney disease, stage III (moderate)   . Cystocele, midline   . Disorder of bone and cartilage, unspecified   . Kyphosis associated with other condition   . Obesity, unspecified   . Osteoarthrosis, unspecified whether generalized or localized, unspecified site   . Other and unspecified hyperlipidemia   . Other hammer toe (acquired)   . Postmenopausal atrophic vaginitis   . PVC's (premature ventricular contractions) 01/29/2015  . RBBB 01/29/2015  . Reflux esophagitis   . Type II or unspecified type diabetes mellitus without mention of complication, not stated as uncontrolled   . Unspecified constipation   . Unspecified disorder of kidney and ureter   . Unspecified essential hypertension   .  Unspecified hereditary and idiopathic peripheral neuropathy   . Unspecified urinary incontinence     Past Surgical History:  Procedure Laterality Date  . BLADDER SURGERY     tie up in 2013  . EYE SURGERY  11/2016   Bilateral Cataract Sx  . TUBAL LIGATION      Allergies  Allergen Reactions  . Pravastatin Sodium Other (See Comments)    Leg Myalgias   . Keflex [Cephalexin]   . Metronidazole   . Simvastatin     Leg cramsps    Outpatient Encounter Medications as of 08/06/2020  Medication Sig  . aspirin 81 MG tablet Take 81 mg by mouth daily.  . Blood Glucose Monitoring Suppl (PRECISION XTRA) w/Device KIT 1 strip by In Vitro route daily. DX: E11.29 E11.65  . calcium-vitamin D (OSCAL WITH D) 500-200 MG-UNIT per tablet Take 1 tablet by mouth daily.  . cholecalciferol (VITAMIN D) 1000 UNITS tablet Take 1,000 Units by mouth daily.  Marland Kitchen desoximetasone (TOPICORT) 0.25 % cream Apply 1 application topically 2 (two) times daily.  Marland Kitchen gabapentin (NEURONTIN) 300 MG capsule TAKE 1 CAPSULE IN THE MORNING AND TAKE 1 CAPSULE IN THE EVENING FOR PAINS  . glipiZIDE (GLUCOTROL) 5 MG tablet TAKE 1 TABLET DAILY  . glucose blood (PRECISION QID TEST) test strip Precision ultra test strips Use as instructed to test blood sugar  once daily DX: E11.29 E11.65  . Lancets (PRECISION ULTRA LANCET) MISC Use as instructed to test blood sugar once daily DX: E11.29 E11.65  . lisinopril-hydrochlorothiazide (ZESTORETIC) 20-12.5 MG tablet ON HOLD AS OF 07/29/2020 Take 1 by mouth daily to control blood pressure  . oxyCODONE-acetaminophen (PERCOCET/ROXICET) 5-325 MG tablet Take one tablet by mouth every 8 hours as needed for severe pain.  . TRADJENTA 5 MG TABS tablet TAKE 1 TABLET DAILY  . triamcinolone cream (KENALOG) 0.1 % Apply 1 application topically 2 (two) times daily.  . vitamin E 400 UNIT capsule Take 400 Units by mouth daily.   No facility-administered encounter medications on file as of 08/06/2020.    Review of  Systems:  Review of Systems  Constitutional: Positive for weight loss. Negative for chills, fever and malaise/fatigue.  Eyes: Negative for blurred vision.  Respiratory: Negative for cough and shortness of breath.   Cardiovascular: Negative for chest pain, palpitations and leg swelling.  Gastrointestinal: Negative for abdominal pain and diarrhea.  Genitourinary: Negative for dysuria.  Musculoskeletal: Positive for back pain and joint pain. Negative for falls.  Skin: Negative for rash.  Neurological: Positive for weakness. Negative for dizziness and loss of consciousness.  Psychiatric/Behavioral: Negative for depression and memory loss. The patient is not nervous/anxious and does not have insomnia.     Health Maintenance  Topic Date Due  . INFLUENZA VACCINE  07/12/2020  . OPHTHALMOLOGY EXAM  12/03/2020  . HEMOGLOBIN A1C  01/28/2021  . FOOT EXAM  03/09/2021  . TETANUS/TDAP  10/01/2026  . DEXA SCAN  Completed  . COVID-19 Vaccine  Completed  . PNA vac Low Risk Adult  Completed    Physical Exam: Vitals:   08/06/20 1430  BP: 118/62  Pulse: 74  Temp: 97.7 F (36.5 C)  TempSrc: Temporal  SpO2: 98%  Weight: 151 lb (68.5 kg)  Height: 5' 5"  (1.651 m)   Body mass index is 25.13 kg/m. Physical Exam Vitals reviewed.  Constitutional:      General: She is not in acute distress.    Appearance: Normal appearance. She is not toxic-appearing.     Comments: Increasingly frail  HENT:     Head: Normocephalic and atraumatic.  Cardiovascular:     Rate and Rhythm: Normal rate and regular rhythm.     Pulses: Normal pulses.     Heart sounds: Normal heart sounds.  Pulmonary:     Effort: Pulmonary effort is normal.     Breath sounds: Normal breath sounds. No wheezing, rhonchi or rales.  Abdominal:     General: Bowel sounds are normal.  Musculoskeletal:        General: Normal range of motion.     Cervical back: Neck supple.     Right lower leg: No edema.     Left lower leg: No edema.    Skin:    General: Skin is warm and dry.  Neurological:     General: No focal deficit present.     Mental Status: She is alert and oriented to person, place, and time.     Cranial Nerves: No cranial nerve deficit.     Sensory: Sensory deficit present.     Motor: Weakness present.     Coordination: Coordination normal.     Gait: Gait abnormal.     Deep Tendon Reflexes: Reflexes normal.     Comments: Walks with cane, also has power scooter  Psychiatric:        Mood and Affect: Mood normal.  Behavior: Behavior normal.        Thought Content: Thought content normal.        Judgment: Judgment normal.     Labs reviewed: Basic Metabolic Panel: Recent Labs    03/19/20 1008 07/28/20 1035  NA 143 141  K 4.4 4.1  CL 106 106  CO2 29 23  GLUCOSE 102* 88  BUN 34* 39*  CREATININE 1.76* 1.94*  CALCIUM 9.8 9.4  TSH 2.57  --    Liver Function Tests: Recent Labs    03/19/20 1008  AST 18  ALT 12  BILITOT 0.3  PROT 6.8   No results for input(s): LIPASE, AMYLASE in the last 8760 hours. No results for input(s): AMMONIA in the last 8760 hours. CBC: Recent Labs    03/19/20 1008  WBC 7.2  NEUTROABS 4,673  HGB 10.8*  HCT 34.8*  MCV 88.8  PLT 160   Lipid Panel: Recent Labs    03/19/20 1008  CHOL 137  HDL 41*  LDLCALC 73  TRIG 146  CHOLHDL 3.3   Lab Results  Component Value Date   HGBA1C 5.8 (H) 07/28/2020    Procedures since last visit: No results found.  Assessment/Plan 1. Acute renal failure superimposed on stage 4 chronic kidney disease, unspecified acute renal failure type ([place]) - zestoretic on hold, f/u labs today - not using nsaids and trying to hydrate with water - Basic metabolic panel  2. Type 2 diabetes mellitus with stage 4 chronic kidney disease, without long-term current use of insulin ([facility]) -very well controlled, still has glipizide--may need to stop it Lab Results  Component Value Date   HGBA1C 5.8 (H) 07/28/2020   - Basic metabolic  panel  3. Mixed hyperlipidemia -not on statin at her advanced age, has been losing weight  4. Spinal stenosis, lumbar region, with neurogenic claudication -severe, gait getting more unsteady and pain not controlled with 2 percocet per day, suggested using the 3 per day ordered so she can be more comfortable in her back and legs  5. Benign essential HTN -bp is controlled on current regimen, but now renal function declining--advised to continue off of zestoretic  6. ACP (advance care planning) -had 18 minute discussion about importance of designating her HCPOA, filling out her living will and about DNR form -she thinks she wants the DNR, but needs to discuss it with her children first--her daughter "is the responsible one" and her son "the wild one"  -need to bring up again next time  Labs/tests ordered:   Lab Orders     Basic metabolic panel     CBC with Differential/Platelet     BASIC METABOLIC PANEL WITH GFR     Hemoglobin A1c  Next appt:  12/10/2020 with fasting labs before   Chalon Zobrist L. Azizi Bally, D.O. [facility] [address] [place], [place] [address] Cell Phone (Mon-Fri 8am-5pm):  [phone] On Call:  [phone] & follow prompts after 5pm & weekends Office Phone:  [phone] Office Fax:  [phone]

## 2020-08-06 NOTE — Patient Instructions (Addendum)
Glucerna shakes--try to have one per day.    Try taking 3 percocet per day and see if your pain is improved.

## 2020-08-07 ENCOUNTER — Telehealth: Payer: Self-pay | Admitting: *Deleted

## 2020-08-07 LAB — BASIC METABOLIC PANEL
BUN/Creatinine Ratio: 17 (calc) (ref 6–22)
BUN: 29 mg/dL — ABNORMAL HIGH (ref 7–25)
CO2: 25 mmol/L (ref 20–32)
Calcium: 9.3 mg/dL (ref 8.6–10.4)
Chloride: 107 mmol/L (ref 98–110)
Creat: 1.72 mg/dL — ABNORMAL HIGH (ref 0.60–0.88)
Glucose, Bld: 50 mg/dL — ABNORMAL LOW (ref 65–139)
Potassium: 4.2 mmol/L (ref 3.5–5.3)
Sodium: 141 mmol/L (ref 135–146)

## 2020-08-07 NOTE — Progress Notes (Signed)
Kidney function did improve with hydration and being off her diuretic. I want her to stay off the lisinopril/hctz that we had put on hold.  If she has a means to monitor her blood pressure once a day at home, that would be ideal to make sure it's staying under 140/90.  (It was fine yesterday w/o the medication)

## 2020-08-07 NOTE — Telephone Encounter (Signed)
Patient called requesting a Handicap Placard to be filled out. Stated she forgot to ask about it at her appointment. Stated that her son drives her and it is hard for her to get around.  Filled out form and placed in Dr. Cyndi Lennert folder to review and sign.  To call patient once ready for pickup [phone]

## 2020-08-10 NOTE — Telephone Encounter (Signed)
Completed this morning.

## 2020-08-10 NOTE — Telephone Encounter (Signed)
Patient notified ready for pick up. Left up front in drawer.  Copy sent for scanning.

## 2020-08-31 ENCOUNTER — Other Ambulatory Visit: Payer: Self-pay | Admitting: *Deleted

## 2020-08-31 DIAGNOSIS — M48062 Spinal stenosis, lumbar region with neurogenic claudication: Secondary | ICD-10-CM

## 2020-08-31 MED ORDER — OXYCODONE-ACETAMINOPHEN 5-325 MG PO TABS: ORAL_TABLET | ORAL | 0 refills | Status: DC

## 2020-08-31 NOTE — Telephone Encounter (Signed)
Patient called to check the status of refill. Patient aware rx is pending for review and approval.  Patient would like a call once rx sent to pharmacy

## 2020-08-31 NOTE — Telephone Encounter (Signed)
Patient requested refill Epic LR: 07/14/2020 Contract on file Pended Rx and sent to Dr. Mariea Clonts for approval.

## 2020-08-31 NOTE — Telephone Encounter (Signed)
Rx sent at 5:40pm on 9/20

## 2020-09-01 NOTE — Telephone Encounter (Signed)
Patient aware rx sent  

## 2020-09-18 ENCOUNTER — Other Ambulatory Visit: Payer: Self-pay

## 2020-09-18 ENCOUNTER — Encounter: Payer: Self-pay | Admitting: Podiatry

## 2020-09-18 ENCOUNTER — Ambulatory Visit (INDEPENDENT_AMBULATORY_CARE_PROVIDER_SITE_OTHER): Payer: Medicare Other | Admitting: Podiatry

## 2020-09-18 DIAGNOSIS — M79609 Pain in unspecified limb: Secondary | ICD-10-CM

## 2020-09-18 DIAGNOSIS — L84 Corns and callosities: Secondary | ICD-10-CM | POA: Diagnosis not present

## 2020-09-18 DIAGNOSIS — E1142 Type 2 diabetes mellitus with diabetic polyneuropathy: Secondary | ICD-10-CM | POA: Diagnosis not present

## 2020-09-18 DIAGNOSIS — B351 Tinea unguium: Secondary | ICD-10-CM | POA: Diagnosis not present

## 2020-09-20 NOTE — Progress Notes (Signed)
Subjective:  Patient ID: Dawn [name], female    DOB: 1928-08-04,  MRN: [id number]  86 y.o. female presents with at risk foot care. Pt has h/o NIDDM with chronic kidney disease and painful corn(s) right 5th digit and painful thick toenails that are difficult to trim. Pain interferes with ambulation. Aggravating factors include wearing enclosed shoe gear. Pain is relieved with periodic professional debridement.   She voices no new pedal problems on today's visit.  PCP is Dr. Hollace Kinnier. Last visit was 08/06/2020.  Review of Systems: Negative except as noted in the HPI. Denies N/V/F/Ch. Past Medical History:  Diagnosis Date  . Anemia, unspecified   . Anorexia   . Chronic kidney disease   . Chronic kidney disease, stage III (moderate) (HCC)   . Cystocele, midline   . Disorder of bone and cartilage, unspecified   . Kyphosis associated with other condition   . Obesity, unspecified   . Osteoarthrosis, unspecified whether generalized or localized, unspecified site   . Other and unspecified hyperlipidemia   . Other hammer toe (acquired)   . Postmenopausal atrophic vaginitis   . PVC's (premature ventricular contractions) 01/29/2015  . RBBB 01/29/2015  . Reflux esophagitis   . Type II or unspecified type diabetes mellitus without mention of complication, not stated as uncontrolled   . Unspecified constipation   . Unspecified disorder of kidney and ureter   . Unspecified essential hypertension   . Unspecified hereditary and idiopathic peripheral neuropathy   . Unspecified urinary incontinence    Past Surgical History:  Procedure Laterality Date  . BLADDER SURGERY     tie up in 2013  . EYE SURGERY  11/2016   Bilateral Cataract Sx  . TUBAL LIGATION     Patient Active Problem List   Diagnosis Date Noted  . Colitis 11/26/2018  . Advance care planning 11/20/2017  . PVC's (premature ventricular contractions) 01/29/2015  . RBBB 01/29/2015  . Mixed hyperlipidemia 01/06/2015  .  Osteopenia 01/06/2015  . Benign essential HTN 07/29/2014  . Weight gain 07/29/2014  . Therapeutic opioid induced constipation 07/29/2014  . Spinal stenosis, lumbar region, with neurogenic claudication 04/30/2014  . Neuropathic pain 03/05/2014  . Bilateral leg pain 02/05/2014  . DM type 2 with diabetic peripheral neuropathy ([place]) 02/05/2014  . Unspecified constipation 01/08/2014  . Leg pain 01/08/2014  . Routine general medical examination at a health care facility 01/08/2014  . Type 2 diabetes mellitus with stage 4 chronic kidney disease, without long-term current use of insulin ([place]) 10/08/2013  . Hypertensive renal disease 10/08/2013  . Need for prophylactic vaccination and inoculation against influenza 10/08/2013  . Generalized OA 07/23/2013  . UTI (urinary tract infection) 06/19/2013  . Hypoglycemia 06/19/2013  . Diabetes mellitus with nephropathy ([place]) 03/21/2013  . Osteoarthritis   . Other and unspecified hyperlipidemia   . Chronic kidney disease     Current Outpatient Medications:  .  aspirin 81 MG tablet, Take 81 mg by mouth daily., Disp: , Rfl:  .  Blood Glucose Monitoring Suppl (PRECISION XTRA) w/Device KIT, 1 strip by In Vitro route daily. DX: E11.29 E11.65, Disp: 1 kit, Rfl: 0 .  calcium-vitamin D (OSCAL WITH D) 500-200 MG-UNIT per tablet, Take 1 tablet by mouth daily., Disp: , Rfl:  .  cholecalciferol (VITAMIN D) 1000 UNITS tablet, Take 1,000 Units by mouth daily., Disp: , Rfl:  .  desoximetasone (TOPICORT) 0.25 % cream, Apply 1 application topically 2 (two) times daily., Disp: 30 g, Rfl: 1 .  gabapentin (NEURONTIN) 300  MG capsule, TAKE 1 CAPSULE IN THE MORNING AND TAKE 1 CAPSULE IN THE EVENING FOR PAINS, Disp: 180 capsule, Rfl: 3 .  glipiZIDE (GLUCOTROL) 5 MG tablet, TAKE 1 TABLET DAILY, Disp: 90 tablet, Rfl: 3 .  glucose blood (PRECISION QID TEST) test strip, Precision ultra test strips Use as instructed to test blood sugar once daily DX: E11.29 E11.65, Disp: 100 each,  Rfl: 1 .  Lancets (PRECISION ULTRA LANCET) MISC, Use as instructed to test blood sugar once daily DX: E11.29 E11.65, Disp: 100 each, Rfl: 1 .  lisinopril-hydrochlorothiazide (ZESTORETIC) 20-12.5 MG tablet, ON HOLD AS OF 07/29/2020 Take 1 by mouth daily to control blood pressure, Disp: , Rfl:  .  oxyCODONE-acetaminophen (PERCOCET/ROXICET) 5-325 MG tablet, Take one tablet by mouth every 8 hours as needed for severe pain., Disp: 90 tablet, Rfl: 0 .  TRADJENTA 5 MG TABS tablet, TAKE 1 TABLET DAILY, Disp: 90 tablet, Rfl: 3 .  triamcinolone cream (KENALOG) 0.1 %, Apply 1 application topically 2 (two) times daily., Disp: , Rfl:  .  vitamin E 400 UNIT capsule, Take 400 Units by mouth daily., Disp: , Rfl:  Allergies  Allergen Reactions  . Pravastatin Sodium Other (See Comments)    Leg Myalgias   . Keflex [Cephalexin]   . Metronidazole   . Simvastatin     Leg cramsps   Social History   Tobacco Use  Smoking Status Former Smoker  . Quit date: 12/12/1968  . Years since quitting: 51.8  Smokeless Tobacco Never Used  Tobacco Comment   quit in 1970   Objective:  There were no vitals filed for this visit. Constitutional Patient is a pleasant 86 y.o. African American female, WD, WN in NAD. AAO x 3.  Vascular Neurovascular status unchanged b/l lower extremities. Capillary fill time to digits <3 seconds b/l lower extremities. Faintly palpable pedal pulses b/l. Pedal hair absent. Lower extremity skin temperature gradient within normal limits. No pain with calf compression b/l. No edema noted b/l lower extremities.No cyanosis or clubbing noted.  Neurologic Normal speech. Oriented to person, place, and time. Protective sensation diminished with 10g monofilament b/l. Vibratory sensation diminished b/l.  Dermatologic Pedal skin with normal turgor, texture and tone bilaterally. No open wounds bilaterally. No interdigital macerations bilaterally. Toenails 1-5 b/l elongated, discolored, dystrophic, thickened,  crumbly with subungual debris and tenderness to dorsal palpation. Hyperkeratotic lesion(s) b/l lower extremities, R 5th toe, submet head 5 left foot and submet head 5 right foot.  No erythema, no edema, no drainage, no flocculence. Evidence of chronic venous insufficiency b/l LE. Skin changes consistent with venous stasis noted b/l LE.  Orthopedic: Normal muscle strength 5/5 to all lower extremity muscle groups bilaterally. No pain crepitus or joint limitation noted with ROM b/l. Hallux valgus with bunion deformity noted b/l lower extremities. Hammertoes noted to the b/l lower extremities. Pes planus deformity noted b/l.  Wearing diabetic shoes today.   Radiographs: None Assessment:   1. Pain due to onychomycosis of nail   2. Corns and callosities   3. Diabetic peripheral neuropathy associated with type 2 diabetes mellitus ([place])    Plan:  Patient was evaluated and treated and all questions answered.  Onychomycosis with pain -Nails palliatively debridement as below. -Educated on self-care  Procedure: Nail Debridement Rationale: Pain Type of Debridement: manual, sharp debridement. Instrumentation: Nail nipper, rotary burr. Number of Nails: 10  -Examined patient. -Continue diabetic foot care principles. -Toenails 1-5 b/l were debrided in length and girth with sterile nail nippers and dremel without  iatrogenic bleeding.  -Corn(s) R 5th toe and callus(es) submet head 5 left foot and submet head 5 right foot were pared utilizing sterile scalpel blade without incident. Total number debrided =3.  Return in about 3 months (around 12/19/2020).  Marzetta Board, DPM

## 2020-10-14 ENCOUNTER — Other Ambulatory Visit: Payer: Self-pay | Admitting: *Deleted

## 2020-10-14 DIAGNOSIS — M48062 Spinal stenosis, lumbar region with neurogenic claudication: Secondary | ICD-10-CM

## 2020-10-14 MED ORDER — OXYCODONE-ACETAMINOPHEN 5-325 MG PO TABS: ORAL_TABLET | ORAL | 0 refills | Status: DC

## 2020-10-14 NOTE — Telephone Encounter (Signed)
Patient requested refill Epic LR: 08/31/2020 Contract on File Pended Rx and sent to Dr. Mariea Clonts for approval.

## 2020-10-19 ENCOUNTER — Encounter: Payer: Self-pay | Admitting: Internal Medicine

## 2020-10-19 ENCOUNTER — Ambulatory Visit (INDEPENDENT_AMBULATORY_CARE_PROVIDER_SITE_OTHER): Payer: Medicare Other | Admitting: Internal Medicine

## 2020-10-19 ENCOUNTER — Other Ambulatory Visit: Payer: Self-pay

## 2020-10-19 VITALS — BP 118/62 | HR 83 | Temp 98.0°F | Ht 65.0 in | Wt 152.0 lb

## 2020-10-19 DIAGNOSIS — I872 Venous insufficiency (chronic) (peripheral): Secondary | ICD-10-CM | POA: Diagnosis not present

## 2020-10-19 DIAGNOSIS — I83029 Varicose veins of left lower extremity with ulcer of unspecified site: Secondary | ICD-10-CM

## 2020-10-19 DIAGNOSIS — L97929 Non-pressure chronic ulcer of unspecified part of left lower leg with unspecified severity: Secondary | ICD-10-CM | POA: Diagnosis not present

## 2020-10-19 NOTE — Patient Instructions (Addendum)
Please wear your right compression stocking--on in the morning and off at bedtime.    For your left leg, we have cleaned it with wound cleanser, applied antibiotic ointment, then a telfa pad, and wrapped it with coban from your toes to knee.  We will recheck it on Thursday. If this begins to feel too tight or your have new pain, let us know sooner.  Elevate your feet at rest.    Chronic Venous Insufficiency Chronic venous insufficiency is a condition where the leg veins cannot effectively pump blood from the legs to the heart. This happens when the vein walls are either stretched, weakened, or damaged, or when the valves inside the vein are damaged. With the right treatment, you should be able to continue with an active life. This condition is also called venous stasis. What are the causes? Common causes of this condition include:  High blood pressure inside the veins (venous hypertension).  Sitting or standing too long, causing increased blood pressure in the leg veins.  A blood clot that blocks blood flow in a vein (deep vein thrombosis, DVT).  Inflammation of a vein (phlebitis) that causes a blood clot to form.  Tumors in the pelvis that cause blood to back up. What increases the risk? The following factors may make you more likely to develop this condition:  Having a family history of this condition.  Obesity.  Pregnancy.  Living without enough regular physical activity or exercise (sedentary lifestyle).  Smoking.  Having a job that requires long periods of standing or sitting in one place.  Being a certain age. Women in their 62s and 73s and men in their 72s are more likely to develop this condition. What are the signs or symptoms? Symptoms of this condition include:  Veins that are enlarged, bulging, or twisted (varicose veins).  Skin breakdown or ulcers.  Reddened skin or dark discoloration of skin on the leg between the knee and ankle.  Brown, smooth, tight, and  painful skin just above the ankle, usually on the inside of the leg (lipodermatosclerosis).  Swelling of the legs. How is this diagnosed? This condition may be diagnosed based on:  Your medical history.  A physical exam.  Tests, such as: ? A procedure that creates an image of a blood vessel and nearby organs and provides information about blood flow through the blood vessel (duplex ultrasound). ? A procedure that tests blood flow (plethysmography). ? A procedure that looks at the veins using X-ray and dye (venogram). How is this treated? The goals of treatment are to help you return to an active life and to minimize pain or disability. Treatment depends on the severity of your condition, and it may include:  Wearing compression stockings. These can help relieve symptoms and help prevent your condition from getting worse. However, they do not cure the condition.  Sclerotherapy. This procedure involves an injection of a solution that shrinks damaged veins.  Surgery. This may involve: ? Removing a diseased vein (vein stripping). ? Cutting off blood flow through the vein (laser ablation surgery). ? Repairing or reconstructing a valve within the affected vein. Follow these instructions at home:      Wear compression stockings as told by your health care provider. These stockings help to prevent blood clots and reduce swelling in your legs.  Take over-the-counter and prescription medicines only as told by your health care provider.  Stay active by exercising, walking, or doing different activities. Ask your health care provider what activities are  safe for you and how much exercise you need.  Drink enough fluid to keep your urine pale yellow.  Do not use any products that contain nicotine or tobacco, such as cigarettes, e-cigarettes, and chewing tobacco. If you need help quitting, ask your health care provider.  Keep all follow-up visits as told by your health care provider. This is  important. Contact a health care provider if you:  Have redness, swelling, or more pain in the affected area.  See a red streak or line that goes up or down from the affected area.  Have skin breakdown or skin loss in the affected area, even if the breakdown is small.  Get an injury in the affected area. Get help right away if:  You get an injury and an open wound in the affected area.  You have: ? Severe pain that does not get better with medicine. ? Sudden numbness or weakness in the foot or ankle below the affected area. ? Trouble moving your foot or ankle. ? A fever. ? Worse or persistent symptoms. ? Chest pain. ? Shortness of breath. Summary  Chronic venous insufficiency is a condition where the leg veins cannot effectively pump blood from the legs to the heart.  Chronic venous insufficiency occurs when the vein walls become stretched, weakened, or damaged, or when valves within the vein are damaged.  Treatment depends on how severe your condition is. It often involves wearing compression stockings and may involve having a procedure.  Make sure you stay active by exercising, walking, or doing different activities. Ask your health care provider what activities are safe for you and how much exercise you need. This information is not intended to replace advice given to you by your health care provider. Make sure you discuss any questions you have with your health care provider. Document Revised: 08/21/2018 Document Reviewed: 08/21/2018 Elsevier Patient Education  [place].

## 2020-10-19 NOTE — Progress Notes (Signed)
Location:  [facility] clinic Provider: Zelia Yzaguirre L. Mariea Clonts, D.O., C.M.D.  Goals of Care:  Advanced Directives 10/19/2020  Does Patient Have a Medical Advance Directive? Yes  Type of Paramedic of [place];Living will  Does patient want to make changes to medical advance directive? No - Patient declined  Copy of [place] in Chart? Yes - validated most recent copy scanned in chart (See row information)  Would patient like information on creating a medical advance directive? -     Chief Complaint  Patient presents with  . Acute Visit    Blister on lower leg that drained and then it came back the next day    HPI: Patient is a 86 y.o. female seen today for an acute visit for blister on lower leg that drained but then came back the next day.  No pain.  Right leg actually more swollen than left where blisters are located.  She's used topical antibiotic ointment on them.    Past Medical History:  Diagnosis Date  . Anemia, unspecified   . Anorexia   . Chronic kidney disease   . Chronic kidney disease, stage III (moderate) (HCC)   . Cystocele, midline   . Disorder of bone and cartilage, unspecified   . Kyphosis associated with other condition   . Obesity, unspecified   . Osteoarthrosis, unspecified whether generalized or localized, unspecified site   . Other and unspecified hyperlipidemia   . Other hammer toe (acquired)   . Postmenopausal atrophic vaginitis   . PVC's (premature ventricular contractions) 01/29/2015  . RBBB 01/29/2015  . Reflux esophagitis   . Type II or unspecified type diabetes mellitus without mention of complication, not stated as uncontrolled   . Unspecified constipation   . Unspecified disorder of kidney and ureter   . Unspecified essential hypertension   . Unspecified hereditary and idiopathic peripheral neuropathy   . Unspecified urinary incontinence     Past Surgical History:  Procedure Laterality Date  . BLADDER SURGERY      tie up in 2013  . EYE SURGERY  11/2016   Bilateral Cataract Sx  . TUBAL LIGATION      Allergies  Allergen Reactions  . Pravastatin Sodium Other (See Comments)    Leg Myalgias   . Keflex [Cephalexin]   . Metronidazole   . Simvastatin     Leg cramsps    Outpatient Encounter Medications as of 10/19/2020  Medication Sig  . aspirin 81 MG tablet Take 81 mg by mouth daily.  . Blood Glucose Monitoring Suppl (PRECISION XTRA) w/Device KIT 1 strip by In Vitro route daily. DX: E11.29 E11.65  . calcium-vitamin D (OSCAL WITH D) 500-200 MG-UNIT per tablet Take 1 tablet by mouth daily.  . cholecalciferol (VITAMIN D) 1000 UNITS tablet Take 1,000 Units by mouth daily.  Marland Kitchen desoximetasone (TOPICORT) 0.25 % cream Apply 1 application topically 2 (two) times daily.  Marland Kitchen gabapentin (NEURONTIN) 300 MG capsule TAKE 1 CAPSULE IN THE MORNING AND TAKE 1 CAPSULE IN THE EVENING FOR PAINS  . glipiZIDE (GLUCOTROL) 5 MG tablet TAKE 1 TABLET DAILY  . glucose blood (PRECISION QID TEST) test strip Precision ultra test strips Use as instructed to test blood sugar once daily DX: E11.29 E11.65  . Lancets (PRECISION ULTRA LANCET) MISC Use as instructed to test blood sugar once daily DX: E11.29 E11.65  . lisinopril-hydrochlorothiazide (ZESTORETIC) 20-12.5 MG tablet ON HOLD AS OF 07/29/2020 Take 1 by mouth daily to control blood pressure  . oxyCODONE-acetaminophen (  PERCOCET/ROXICET) 5-325 MG tablet Take one tablet by mouth every 8 hours as needed for severe pain.  . TRADJENTA 5 MG TABS tablet TAKE 1 TABLET DAILY  . triamcinolone cream (KENALOG) 0.1 % Apply 1 application topically 2 (two) times daily.  . vitamin E 400 UNIT capsule Take 400 Units by mouth daily.   No facility-administered encounter medications on file as of 10/19/2020.    Review of Systems:  ROS  Health Maintenance  Topic Date Due  . INFLUENZA VACCINE  07/12/2020  . OPHTHALMOLOGY EXAM  12/03/2020  . HEMOGLOBIN A1C  01/28/2021  . FOOT EXAM  03/09/2021    . TETANUS/TDAP  10/01/2026  . DEXA SCAN  Completed  . COVID-19 Vaccine  Completed  . PNA vac Low Risk Adult  Completed    Physical Exam: Vitals:   10/19/20 1144  BP: 118/62  Pulse: 83  Temp: 98 F (36.7 C)  TempSrc: Temporal  SpO2: 98%  Weight: 152 lb (68.9 kg)  Height: 5' 5"  (1.651 m)   Body mass index is 25.29 kg/m. Physical Exam Vitals reviewed.  Constitutional:      Appearance: Normal appearance.  Cardiovascular:     Rate and Rhythm: Normal rate and regular rhythm.  Pulmonary:     Effort: Pulmonary effort is normal.     Breath sounds: Normal breath sounds. No wheezing, rhonchi or rales.  Abdominal:     General: Bowel sounds are normal.     Tenderness: There is no abdominal tenderness.  Musculoskeletal:     Right lower leg: Edema present.     Left lower leg: Edema present.     Comments: Right more swollen than left  Skin:    Comments: Blisters of left ankle which ruptured and drained--one size of half dollar, another less than a dime, no surrounding erythema, or warmth, no odor, foot not swollen and right leg 2+ while right just 1+ of leg only  Neurological:     General: No focal deficit present.     Mental Status: She is alert and oriented to person, place, and time.  Psychiatric:        Mood and Affect: Mood normal.        Behavior: Behavior normal.    Labs reviewed: Basic Metabolic Panel: Recent Labs    03/19/20 1008 07/28/20 1035 08/06/20 1527  NA 143 141 141  K 4.4 4.1 4.2  CL 106 106 107  CO2 29 23 25   GLUCOSE 102* 88 50*  BUN 34* 39* 29*  CREATININE 1.76* 1.94* 1.72*  CALCIUM 9.8 9.4 9.3  TSH 2.57  --   --    Liver Function Tests: Recent Labs    03/19/20 1008  AST 18  ALT 12  BILITOT 0.3  PROT 6.8   No results for input(s): LIPASE, AMYLASE in the last 8760 hours. No results for input(s): AMMONIA in the last 8760 hours. CBC: Recent Labs    03/19/20 1008  WBC 7.2  NEUTROABS 4,673  HGB 10.8*  HCT 34.8*  MCV 88.8  PLT 160    Lipid Panel: Recent Labs    03/19/20 1008  CHOL 137  HDL 41*  LDLCALC 73  TRIG 146  CHOLHDL 3.3   Lab Results  Component Value Date   HGBA1C 5.8 (H) 07/28/2020    Procedures since last visit: No results found.  Assessment/Plan 1. Venous ulcer of left leg ([place]) -cleansed with wound cleanser, antibiotic ointment applied, telfa covered open blisters, wrapped with coban -instructed to elevate both  feet at rest -f/u Thursday for reassessment here  2. Chronic venous insufficiency -wear right compression sock she has at home -if ulcers clear up, will need to wear compression sock on left, as well  Labs/tests ordered:  No new added Next appt:  F/u in 3 days for reassessment and possibly rewrapping of leg vs other wound care recommendations  Mireyah Chervenak L. Richy Spradley, D.O. [facility] [address], [place] [address] Cell Phone (Mon-Fri 8am-5pm):  [phone] On Call:  [phone] & follow prompts after 5pm & weekends Office Phone:  [phone] Office Fax:  [phone]

## 2020-10-22 ENCOUNTER — Other Ambulatory Visit: Payer: Self-pay

## 2020-10-22 ENCOUNTER — Ambulatory Visit (INDEPENDENT_AMBULATORY_CARE_PROVIDER_SITE_OTHER): Payer: Medicare Other | Admitting: Adult Health

## 2020-10-22 ENCOUNTER — Encounter: Payer: Self-pay | Admitting: Adult Health

## 2020-10-22 VITALS — BP 130/82 | HR 85 | Temp 97.7°F | Resp 16 | Ht 65.0 in | Wt 150.0 lb

## 2020-10-22 DIAGNOSIS — I83029 Varicose veins of left lower extremity with ulcer of unspecified site: Secondary | ICD-10-CM | POA: Diagnosis not present

## 2020-10-22 DIAGNOSIS — I872 Venous insufficiency (chronic) (peripheral): Secondary | ICD-10-CM | POA: Diagnosis not present

## 2020-10-22 DIAGNOSIS — R600 Localized edema: Secondary | ICD-10-CM

## 2020-10-22 DIAGNOSIS — L97929 Non-pressure chronic ulcer of unspecified part of left lower leg with unspecified severity: Secondary | ICD-10-CM | POA: Diagnosis not present

## 2020-10-22 NOTE — Patient Instructions (Addendum)
Cleanse left lower leg blister with NS, pat dry. Apply xeroform gauze, cover with Telfa dressing and cover with Coban from the foot to the knee. -Elevate bilateral legs when sitting for a long time and at night. On blister sites. -Wear support socks at all times. -  Monitor for redness - Follow up at [facility] in 1 week for wound check and change of dressing.    Chronic Venous Insufficiency Chronic venous insufficiency is a condition where the leg veins cannot effectively pump blood from the legs to the heart. This happens when the vein walls are either stretched, weakened, or damaged, or when the valves inside the vein are damaged. With the right treatment, you should be able to continue with an active life. This condition is also called venous stasis. What are the causes? Common causes of this condition include:  High blood pressure inside the veins (venous hypertension).  Sitting or standing too long, causing increased blood pressure in the leg veins.  A blood clot that blocks blood flow in a vein (deep vein thrombosis, DVT).  Inflammation of a vein (phlebitis) that causes a blood clot to form.  Tumors in the pelvis that cause blood to back up. What increases the risk? The following factors may make you more likely to develop this condition:  Having a family history of this condition.  Obesity.  Pregnancy.  Living without enough regular physical activity or exercise (sedentary lifestyle).  Smoking.  Having a job that requires long periods of standing or sitting in one place.  Being a certain age. Women in their 54s and 8s and men in their 51s are more likely to develop this condition. What are the signs or symptoms? Symptoms of this condition include:  Veins that are enlarged, bulging, or twisted (varicose veins).  Skin breakdown or ulcers.  Reddened skin or dark discoloration of skin on the leg between the knee and ankle.  Brown, smooth, tight, and painful skin just  above the ankle, usually on the inside of the leg (lipodermatosclerosis).  Swelling of the legs. How is this diagnosed? This condition may be diagnosed based on:  Your medical history.  A physical exam.  Tests, such as: ? A procedure that creates an image of a blood vessel and nearby organs and provides information about blood flow through the blood vessel (duplex ultrasound). ? A procedure that tests blood flow (plethysmography). ? A procedure that looks at the veins using X-ray and dye (venogram). How is this treated? The goals of treatment are to help you return to an active life and to minimize pain or disability. Treatment depends on the severity of your condition, and it may include:  Wearing compression stockings. These can help relieve symptoms and help prevent your condition from getting worse. However, they do not cure the condition.  Sclerotherapy. This procedure involves an injection of a solution that shrinks damaged veins.  Surgery. This may involve: ? Removing a diseased vein (vein stripping). ? Cutting off blood flow through the vein (laser ablation surgery). ? Repairing or reconstructing a valve within the affected vein. Follow these instructions at home:      Wear compression stockings as told by your health care provider. These stockings help to prevent blood clots and reduce swelling in your legs.  Take over-the-counter and prescription medicines only as told by your health care provider.  Stay active by exercising, walking, or doing different activities. Ask your health care provider what activities are safe for you and how much  exercise you need.  Drink enough fluid to keep your urine pale yellow.  Do not use any products that contain nicotine or tobacco, such as cigarettes, e-cigarettes, and chewing tobacco. If you need help quitting, ask your health care provider.  Keep all follow-up visits as told by your health care provider. This is  important. Contact a health care provider if you:  Have redness, swelling, or more pain in the affected area.  See a red streak or line that goes up or down from the affected area.  Have skin breakdown or skin loss in the affected area, even if the breakdown is small.  Get an injury in the affected area. Get help right away if:  You get an injury and an open wound in the affected area.  You have: ? Severe pain that does not get better with medicine. ? Sudden numbness or weakness in the foot or ankle below the affected area. ? Trouble moving your foot or ankle. ? A fever. ? Worse or persistent symptoms. ? Chest pain. ? Shortness of breath. Summary  Chronic venous insufficiency is a condition where the leg veins cannot effectively pump blood from the legs to the heart.  Chronic venous insufficiency occurs when the vein walls become stretched, weakened, or damaged, or when valves within the vein are damaged.  Treatment depends on how severe your condition is. It often involves wearing compression stockings and may involve having a procedure.  Make sure you stay active by exercising, walking, or doing different activities. Ask your health care provider what activities are safe for you and how much exercise you need. This information is not intended to replace advice given to you by your health care provider. Make sure you discuss any questions you have with your health care provider. Document Revised: 08/21/2018 Document Reviewed: 08/21/2018 Elsevier Patient Education  [place].

## 2020-10-22 NOTE — Progress Notes (Signed)
[facility] clinic  Provider:   Durenda Age - DNP   Code Status:    Goals of Care:  Advanced Directives 10/22/2020  Does Patient Have a Medical Advance Directive? Yes  Type of Advance Directive Living will;Healthcare Power of Attorney  Does patient want to make changes to medical advance directive? No - Patient declined  Copy of [place] in Chart? No - copy requested  Would patient like information on creating a medical advance directive? -     Chief Complaint  Patient presents with  . Acute Visit    Blister on left leg.    HPI: Patient is a 86 y.o. female seen today for an acute visit for left leg blister. She has a PMH of diabetes mellitus type 2, chronic kidney disease and anemia. She has blisters on her left lower leg and was evaluated at [facility] on 10/19/20. Wraps were started on her left lower leg. She came today for blister check. Noted blister still intact on left lower leg, inner side. No erythema noted. She wasn't wearing compression socks on left leg. No blister on right lower leg was noted. She denies having fever nor chills.   Past Medical History:  Diagnosis Date  . Anemia, unspecified   . Anorexia   . Chronic kidney disease   . Chronic kidney disease, stage III (moderate) (HCC)   . Cystocele, midline   . Disorder of bone and cartilage, unspecified   . Kyphosis associated with other condition   . Obesity, unspecified   . Osteoarthrosis, unspecified whether generalized or localized, unspecified site   . Other and unspecified hyperlipidemia   . Other hammer toe (acquired)   . Postmenopausal atrophic vaginitis   . PVC's (premature ventricular contractions) 01/29/2015  . RBBB 01/29/2015  . Reflux esophagitis   . Type II or unspecified type diabetes mellitus without mention of complication, not stated as uncontrolled   . Unspecified constipation   . Unspecified disorder of kidney and ureter   . Unspecified essential hypertension   .  Unspecified hereditary and idiopathic peripheral neuropathy   . Unspecified urinary incontinence     Past Surgical History:  Procedure Laterality Date  . BLADDER SURGERY     tie up in 2013  . EYE SURGERY  11/2016   Bilateral Cataract Sx  . TUBAL LIGATION      Allergies  Allergen Reactions  . Pravastatin Sodium Other (See Comments)    Leg Myalgias   . Keflex [Cephalexin]   . Metronidazole   . Simvastatin     Leg cramsps    Outpatient Encounter Medications as of 10/22/2020  Medication Sig  . aspirin 81 MG tablet Take 81 mg by mouth daily.  . Blood Glucose Monitoring Suppl (PRECISION XTRA) w/Device KIT 1 strip by In Vitro route daily. DX: E11.29 E11.65  . calcium-vitamin D (OSCAL WITH D) 500-200 MG-UNIT per tablet Take 1 tablet by mouth daily.  . cholecalciferol (VITAMIN D) 1000 UNITS tablet Take 1,000 Units by mouth daily.  Marland Kitchen desoximetasone (TOPICORT) 0.25 % cream Apply 1 application topically 2 (two) times daily.  Marland Kitchen gabapentin (NEURONTIN) 300 MG capsule TAKE 1 CAPSULE IN THE MORNING AND TAKE 1 CAPSULE IN THE EVENING FOR PAINS  . glipiZIDE (GLUCOTROL) 5 MG tablet TAKE 1 TABLET DAILY  . glucose blood (PRECISION QID TEST) test strip Precision ultra test strips Use as instructed to test blood sugar once daily DX: E11.29 E11.65  . Lancets (PRECISION ULTRA LANCET) MISC Use as instructed to  test blood sugar once daily DX: E11.29 E11.65  . oxyCODONE-acetaminophen (PERCOCET/ROXICET) 5-325 MG tablet Take one tablet by mouth every 8 hours as needed for severe pain.  . TRADJENTA 5 MG TABS tablet TAKE 1 TABLET DAILY  . triamcinolone cream (KENALOG) 0.1 % Apply 1 application topically 2 (two) times daily.  . vitamin E 400 UNIT capsule Take 400 Units by mouth daily.  . [DISCONTINUED] lisinopril-hydrochlorothiazide (ZESTORETIC) 20-12.5 MG tablet ON HOLD AS OF 07/29/2020 Take 1 by mouth daily to control blood pressure   No facility-administered encounter medications on file as of 10/22/2020.     Review of Systems:  Review of Systems  Constitutional: Negative for activity change, appetite change and chills.  HENT: Negative for congestion and sneezing.   Respiratory: Negative for cough, shortness of breath and wheezing.   Cardiovascular: Positive for leg swelling.  Gastrointestinal: Negative for constipation and vomiting.  Genitourinary: Negative for difficulty urinating.  Musculoskeletal: Negative for gait problem.  Skin:       Blister on left lower leg  Neurological: Negative for seizures and weakness.  Psychiatric/Behavioral: Negative.  Negative for behavioral problems.    Health Maintenance  Topic Date Due  . URINE MICROALBUMIN  01/31/2017  . INFLUENZA VACCINE  07/12/2020  . OPHTHALMOLOGY EXAM  12/03/2020  . HEMOGLOBIN A1C  01/28/2021  . FOOT EXAM  03/09/2021  . TETANUS/TDAP  10/01/2026  . DEXA SCAN  Completed  . COVID-19 Vaccine  Completed  . PNA vac Low Risk Adult  Completed    Physical Exam: Vitals:   10/22/20 1307  BP: 130/82  Pulse: 85  Resp: 16  Temp: 97.7 F (36.5 C)  SpO2: 98%  Weight: 150 lb (68 kg)  Height: 5' 5"  (1.651 m)   Body mass index is 24.96 kg/m. Physical Exam HENT:     Head: Normocephalic.     Mouth/Throat:     Mouth: Mucous membranes are moist.  Cardiovascular:     Rate and Rhythm: Normal rate and regular rhythm.     Pulses: Normal pulses.     Heart sounds: Normal heart sounds.  Pulmonary:     Effort: Pulmonary effort is normal.     Breath sounds: Normal breath sounds.  Abdominal:     Palpations: Abdomen is soft.  Musculoskeletal:        General: Normal range of motion.     Cervical back: Normal range of motion.     Right lower leg: Edema present.     Left lower leg: Edema present.     Comments: RLE trace edema LLE 1+ edema  Skin:    General: Skin is warm.     Comments: Blister on right lower leg, inner  Neurological:     Mental Status: She is alert.     Labs reviewed: Basic Metabolic Panel: Recent Labs     03/19/20 1008 07/28/20 1035 08/06/20 1527  NA 143 141 141  K 4.4 4.1 4.2  CL 106 106 107  CO2 29 23 25   GLUCOSE 102* 88 50*  BUN 34* 39* 29*  CREATININE 1.76* 1.94* 1.72*  CALCIUM 9.8 9.4 9.3  TSH 2.57  --   --    Liver Function Tests: Recent Labs    03/19/20 1008  AST 18  ALT 12  BILITOT 0.3  PROT 6.8   CBC: Recent Labs    03/19/20 1008  WBC 7.2  NEUTROABS 4,673  HGB 10.8*  HCT 34.8*  MCV 88.8  PLT 160   Lipid Panel: Recent  Labs    03/19/20 1008  CHOL 137  HDL 41*  LDLCALC 73  TRIG 146  CHOLHDL 3.3   Lab Results  Component Value Date   HGBA1C 5.8 (H) 07/28/2020     Assessment/Plan  1. Venous ulcer of left leg (HCC) -  Cleanse left lower leg blister with NS, pat dry. Apply xeroform gauze, coverwithTelfa dressing and cover with Coban from the foot to the knee. -Elevate bilateral legs when sitting for a long time and at night. On blister sites. -Wear support socks at all times. -  Monitor for redness - Follow up at [facility] in 1 week for wound check and change of dressing.  2. Chronic venous insufficiency -  This is thought to be causing the blisters, instructed to wear compression socks, elevate her legs if sitting for a long time and at night  3. Bilateral lower extremity edema -  Wear compression socks and to elevate lower legs as often as she can -  Monitor legs for erythema or additional blisters     Labs/tests ordered:  None   Next appt:  10/29/20

## 2020-10-29 ENCOUNTER — Ambulatory Visit (INDEPENDENT_AMBULATORY_CARE_PROVIDER_SITE_OTHER): Payer: Medicare Other | Admitting: Internal Medicine

## 2020-10-29 ENCOUNTER — Encounter: Payer: Self-pay | Admitting: Internal Medicine

## 2020-10-29 ENCOUNTER — Other Ambulatory Visit: Payer: Self-pay

## 2020-10-29 VITALS — BP 138/72 | HR 71 | Temp 98.4°F | Ht 65.0 in | Wt 149.8 lb

## 2020-10-29 DIAGNOSIS — I872 Venous insufficiency (chronic) (peripheral): Secondary | ICD-10-CM

## 2020-10-29 DIAGNOSIS — L97929 Non-pressure chronic ulcer of unspecified part of left lower leg with unspecified severity: Secondary | ICD-10-CM

## 2020-10-29 DIAGNOSIS — N184 Chronic kidney disease, stage 4 (severe): Secondary | ICD-10-CM

## 2020-10-29 DIAGNOSIS — I83029 Varicose veins of left lower extremity with ulcer of unspecified site: Secondary | ICD-10-CM | POA: Diagnosis not present

## 2020-10-29 DIAGNOSIS — Z23 Encounter for immunization: Secondary | ICD-10-CM

## 2020-10-29 DIAGNOSIS — E1122 Type 2 diabetes mellitus with diabetic chronic kidney disease: Secondary | ICD-10-CM | POA: Diagnosis not present

## 2020-10-29 NOTE — Patient Instructions (Addendum)
Follow-up on Tuesday or Wednesday again with Amy to have the left leg wrap changed.    Please bring [place] a copy of your living will and health care power of attorney.

## 2020-10-29 NOTE — Progress Notes (Signed)
Location:  [facility] clinic Provider: Collier Bohnet L. Mariea Clonts, D.O., C.M.D.  Goals of Care:  Advanced Directives 10/29/2020  Does Patient Have a Medical Advance Directive? Yes  Type of Paramedic of [place];Living will  Does patient want to make changes to medical advance directive? No - Patient declined  Copy of [place] in Chart? No - copy requested  Would patient like information on creating a medical advance directive? -     Chief Complaint  Patient presents with  . Medical Management of Chronic Issues    Follow up for [place] Ulcer of the left leg   . Health Maintenance    Influenza, Urine microalbumin     HPI: Patient is a 86 y.o. female seen today for an acute visit for f/u on left leg venous ulcers.  She has had compression wraps now since 11/8.  She'd seen Monina in follow-up once for a wrap change on 11/11.  She's not had any signs of infection.  She had very little discomfort at one point when it sounds like one of the ulcers ruptured.  Swelling is much improved on left though right where she's wearing a rather loose compression sock (looks more like a tall diabetic sock) remains considerable.  She does avoid sodium and tries to remember to elevate her feet when at rest.  Flu shot given today.  Past Medical History:  Diagnosis Date  . Anemia, unspecified   . Anorexia   . Chronic kidney disease   . Chronic kidney disease, stage III (moderate) (HCC)   . Cystocele, midline   . Disorder of bone and cartilage, unspecified   . Kyphosis associated with other condition   . Obesity, unspecified   . Osteoarthrosis, unspecified whether generalized or localized, unspecified site   . Other and unspecified hyperlipidemia   . Other hammer toe (acquired)   . Postmenopausal atrophic vaginitis   . PVC's (premature ventricular contractions) 01/29/2015  . RBBB 01/29/2015  . Reflux esophagitis   . Type II or unspecified type diabetes mellitus without  mention of complication, not stated as uncontrolled   . Unspecified constipation   . Unspecified disorder of kidney and ureter   . Unspecified essential hypertension   . Unspecified hereditary and idiopathic peripheral neuropathy   . Unspecified urinary incontinence     Past Surgical History:  Procedure Laterality Date  . BLADDER SURGERY     tie up in 2013  . EYE SURGERY  11/2016   Bilateral Cataract Sx  . TUBAL LIGATION      Allergies  Allergen Reactions  . Pravastatin Sodium Other (See Comments)    Leg Myalgias   . Keflex [Cephalexin]   . Metronidazole   . Simvastatin     Leg cramsps    Outpatient Encounter Medications as of 10/29/2020  Medication Sig  . aspirin 81 MG tablet Take 81 mg by mouth daily.  . Blood Glucose Monitoring Suppl (PRECISION XTRA) w/Device KIT 1 strip by In Vitro route daily. DX: E11.29 E11.65  . calcium-vitamin D (OSCAL WITH D) 500-200 MG-UNIT per tablet Take 1 tablet by mouth daily.  . cholecalciferol (VITAMIN D) 1000 UNITS tablet Take 1,000 Units by mouth daily.  Marland Kitchen desoximetasone (TOPICORT) 0.25 % cream Apply 1 application topically 2 (two) times daily.  Marland Kitchen gabapentin (NEURONTIN) 300 MG capsule TAKE 1 CAPSULE IN THE MORNING AND TAKE 1 CAPSULE IN THE EVENING FOR PAINS  . glipiZIDE (GLUCOTROL) 5 MG tablet TAKE 1 TABLET DAILY  . glucose  blood (PRECISION QID TEST) test strip Precision ultra test strips Use as instructed to test blood sugar once daily DX: E11.29 E11.65  . Lancets (PRECISION ULTRA LANCET) MISC Use as instructed to test blood sugar once daily DX: E11.29 E11.65  . oxyCODONE-acetaminophen (PERCOCET/ROXICET) 5-325 MG tablet Take one tablet by mouth every 8 hours as needed for severe pain.  . TRADJENTA 5 MG TABS tablet TAKE 1 TABLET DAILY  . triamcinolone cream (KENALOG) 0.1 % Apply 1 application topically 2 (two) times daily.  . vitamin E 400 UNIT capsule Take 400 Units by mouth daily.   No facility-administered encounter medications on file  as of 10/29/2020.    Review of Systems:  Review of Systems  Constitutional: Negative for chills and fever.  Eyes:       Glasses  Cardiovascular: Positive for leg swelling. Negative for chest pain and palpitations.  Gastrointestinal: Negative for abdominal pain.  Musculoskeletal: Negative for falls.       Using cane today  Skin:       Left lower leg ulcerations--anterior ones are all ruptured and flattened, posteriorly one just ruptured and is draining serosanguinous drainage onto the drape  Neurological: Negative for dizziness.  Psychiatric/Behavioral: Negative for memory loss.    Health Maintenance  Topic Date Due  . URINE MICROALBUMIN  01/31/2017  . INFLUENZA VACCINE  07/12/2020  . OPHTHALMOLOGY EXAM  12/03/2020  . HEMOGLOBIN A1C  01/28/2021  . FOOT EXAM  03/09/2021  . TETANUS/TDAP  10/01/2026  . DEXA SCAN  Completed  . COVID-19 Vaccine  Completed  . PNA vac Low Risk Adult  Completed    Physical Exam: Vitals:   10/29/20 1309  BP: 138/72  Pulse: 71  Temp: 98.4 F (36.9 C)  SpO2: 98%  Weight: 149 lb 12.8 oz (67.9 kg)  Height: 5' 5"  (1.651 m)   Body mass index is 24.93 kg/m. Physical Exam Vitals reviewed.  Constitutional:      Appearance: Normal appearance.  HENT:     Head: Normocephalic and atraumatic.  Cardiovascular:     Rate and Rhythm: Normal rate and regular rhythm.  Pulmonary:     Effort: Pulmonary effort is normal.  Musculoskeletal:        General: Normal range of motion.     Right lower leg: Edema present.  Skin:    Comments: Left lower leg ulcerations--anterior ones are all ruptured and flattened, posteriorly one just ruptured and is draining serosanguinous drainage onto the drape  Neurological:     Mental Status: She is alert.     Labs reviewed: Basic Metabolic Panel: Recent Labs    03/19/20 1008 07/28/20 1035 08/06/20 1527  NA 143 141 141  K 4.4 4.1 4.2  CL 106 106 107  CO2 29 23 25   GLUCOSE 102* 88 50*  BUN 34* 39* 29*   CREATININE 1.76* 1.94* 1.72*  CALCIUM 9.8 9.4 9.3  TSH 2.57  --   --    Liver Function Tests: Recent Labs    03/19/20 1008  AST 18  ALT 12  BILITOT 0.3  PROT 6.8   No results for input(s): LIPASE, AMYLASE in the last 8760 hours. No results for input(s): AMMONIA in the last 8760 hours. CBC: Recent Labs    03/19/20 1008  WBC 7.2  NEUTROABS 4,673  HGB 10.8*  HCT 34.8*  MCV 88.8  PLT 160   Lipid Panel: Recent Labs    03/19/20 1008  CHOL 137  HDL 41*  LDLCALC 73  TRIG 146  CHOLHDL 3.3   Lab Results  Component Value Date   HGBA1C 5.8 (H) 07/28/2020    Assessment/Plan 1. Need for influenza vaccination - Flu Vaccine QUAD High Dose(Fluad) given  2. Venous ulcer of left leg (HCC) -elevate feet at rest -avoid high sodium foods -start new compression sock on right leg when pick up (rx given to get at [facility] for medium 20-71mhg strength--3 pairs) -f/u Wednesday with Amy for change in wraps or discontinuing wraps if drainage has resolved (then to begin new hose on both legs)  3. Chronic venous insufficiency -elevate, compression in day, off at hs  4.  DMII with CKD:  Check urine microalbumin today if she's able to make a sample  Labs/tests ordered:   Lab Orders     Microalbumin / creatinine urine ratio  Next appt:  F/u with Amy on Wed  Anel Purohit L. Dakotah Orrego, D.O. [facility] [address] [id number] Phone (Mon-Fri 8am-5pm):  [phone] Call:  [phone]& follow prompts after 5pm & weekends Office Phone:  [phone] Fax:  [phone]

## 2020-10-31 LAB — MICROALBUMIN / CREATININE URINE RATIO
Creatinine, Urine: 73 mg/dL (ref 20–275)
Microalb Creat Ratio: 3 mcg/mg creat (ref <30)
Microalb, Ur: 0.2 mg/dL

## 2020-11-02 NOTE — Progress Notes (Signed)
No significant protein in her urine :)

## 2020-11-03 ENCOUNTER — Other Ambulatory Visit: Payer: Self-pay

## 2020-11-03 ENCOUNTER — Ambulatory Visit (INDEPENDENT_AMBULATORY_CARE_PROVIDER_SITE_OTHER): Payer: Medicare Other | Admitting: Orthopedic Surgery

## 2020-11-03 ENCOUNTER — Encounter: Payer: Self-pay | Admitting: Orthopedic Surgery

## 2020-11-03 VITALS — BP 130/80 | HR 78 | Temp 97.3°F | Resp 20 | Ht 65.0 in | Wt 150.0 lb

## 2020-11-03 DIAGNOSIS — I872 Venous insufficiency (chronic) (peripheral): Secondary | ICD-10-CM

## 2020-11-03 DIAGNOSIS — L97929 Non-pressure chronic ulcer of unspecified part of left lower leg with unspecified severity: Secondary | ICD-10-CM | POA: Diagnosis not present

## 2020-11-03 DIAGNOSIS — I83029 Varicose veins of left lower extremity with ulcer of unspecified site: Secondary | ICD-10-CM

## 2020-11-03 NOTE — Progress Notes (Signed)
Careteam: Patient Care Team: Gayland Curry, DO as PCP - General (Geriatric Medicine) Clent Jacks, MD as Consulting Physician (Ophthalmology) Sydnee Levans, MD as Referring Physician (Dermatology)  Seen by: Windell Moulding, AGNP-C  PLACE OF SERVICE:  [address]  Advanced Directive information    Allergies  Allergen Reactions  . Pravastatin Sodium Other (See Comments)    Leg Myalgias   . Keflex [Cephalexin]   . Metronidazole   . Simvastatin     Leg cramsps    No chief complaint on file.    HPI: Patient is a 86 y.o. female seen today for follow-up visit on left leg venous ulcers PMH includes: T2DM with neuropathy, chronic kidney disease, leg pain, and anemia. Prior to visit, her left leg was not bothersome until blister had formed. She was seen  10/22/20 and advised to clean blister and wear compression stockings. She then developed ulcers on her left lower leg, and they began to weep. Her left and right leg also increased in swelling. Seen by Dr. Mariea Clonts 10/29/20. Advised to elevate legs, avoid high sodium foods, wear 20-30 mm compression socks, and follow up in 1 week.  Today, she states her bilateral leg pain has improved and is not "constant." Does not think ulcers on left lower leg are weeping, she has not changed dressing since it was placed with Dr. Mariea Clonts last week. She is elevating her legs daily. She also got compression stockings - knee high and  thigh high. Wearing knee high today, because new thigh high have not come in mail yet. Right lower leg is still swollen. Denies any ulcers or blisters.   Review of Systems:  Review of Systems  Constitutional: Negative for fever.  Eyes:       Glasses  Respiratory: Negative for cough and shortness of breath.   Cardiovascular: Positive for leg swelling. Negative for chest pain.  Musculoskeletal:       Uses cane  Skin:       Left lower leg ulcers    Past Medical History:  Diagnosis Date  . Anemia, unspecified   .  Anorexia   . Chronic kidney disease   . Chronic kidney disease, stage III (moderate) (HCC)   . Cystocele, midline   . Disorder of bone and cartilage, unspecified   . Kyphosis associated with other condition   . Obesity, unspecified   . Osteoarthrosis, unspecified whether generalized or localized, unspecified site   . Other and unspecified hyperlipidemia   . Other hammer toe (acquired)   . Postmenopausal atrophic vaginitis   . PVC's (premature ventricular contractions) 01/29/2015  . RBBB 01/29/2015  . Reflux esophagitis   . Type II or unspecified type diabetes mellitus without mention of complication, not stated as uncontrolled   . Unspecified constipation   . Unspecified disorder of kidney and ureter   . Unspecified essential hypertension   . Unspecified hereditary and idiopathic peripheral neuropathy   . Unspecified urinary incontinence    Past Surgical History:  Procedure Laterality Date  . BLADDER SURGERY     tie up in 2013  . EYE SURGERY  11/2016   Bilateral Cataract Sx  . TUBAL LIGATION     Social History:   reports that she quit smoking about 51 years ago. She has never used smokeless tobacco. She reports that she does not drink alcohol and does not use drugs.  Family History  Problem Relation Age of Onset  . Heart disease Mother   . Heart disease Sister   .  Cancer Son   . Cancer Son   . Stomach cancer Neg Hx   . Colon cancer Neg Hx   . Pancreatic cancer Neg Hx     Medications: Patient's Medications  New Prescriptions   No medications on file  Previous Medications   ASPIRIN 81 MG TABLET    Take 81 mg by mouth daily.   BLOOD GLUCOSE MONITORING SUPPL (PRECISION XTRA) W/DEVICE KIT    1 strip by In Vitro route daily. DX: E11.29 E11.65   CALCIUM-VITAMIN D (OSCAL WITH D) 500-200 MG-UNIT PER TABLET    Take 1 tablet by mouth daily.   CHOLECALCIFEROL (VITAMIN D) 1000 UNITS TABLET    Take 1,000 Units by mouth daily.   DESOXIMETASONE (TOPICORT) 0.25 % CREAM    Apply 1  application topically 2 (two) times daily.   GABAPENTIN (NEURONTIN) 300 MG CAPSULE    TAKE 1 CAPSULE IN THE MORNING AND TAKE 1 CAPSULE IN THE EVENING FOR PAINS   GLIPIZIDE (GLUCOTROL) 5 MG TABLET    TAKE 1 TABLET DAILY   GLUCOSE BLOOD (PRECISION QID TEST) TEST STRIP    Precision ultra test strips Use as instructed to test blood sugar once daily DX: E11.29 E11.65   LANCETS (PRECISION ULTRA LANCET) MISC    Use as instructed to test blood sugar once daily DX: E11.29 E11.65   OXYCODONE-ACETAMINOPHEN (PERCOCET/ROXICET) 5-325 MG TABLET    Take one tablet by mouth every 8 hours as needed for severe pain.   TRADJENTA 5 MG TABS TABLET    TAKE 1 TABLET DAILY   TRIAMCINOLONE CREAM (KENALOG) 0.1 %    Apply 1 application topically 2 (two) times daily.   VITAMIN E 400 UNIT CAPSULE    Take 400 Units by mouth daily.  Modified Medications   No medications on file  Discontinued Medications   No medications on file    Physical Exam:  There were no vitals filed for this visit. There is no height or weight on file to calculate BMI. Wt Readings from Last 3 Encounters:  10/29/20 149 lb 12.8 oz (67.9 kg)  10/22/20 150 lb (68 kg)  10/19/20 152 lb (68.9 kg)    Physical Exam Vitals reviewed.  Constitutional:      Appearance: Normal appearance. She is normal weight.  Cardiovascular:     Rate and Rhythm: Normal rate and regular rhythm.     Pulses: Normal pulses.     Heart sounds: Normal heart sounds. No murmur heard.   Pulmonary:     Effort: Pulmonary effort is normal. No respiratory distress.     Breath sounds: Normal breath sounds. No wheezing.  Musculoskeletal:     Right lower leg: 2+ Edema present.     Left lower leg: No edema.     Right ankle: Normal pulse.     Left ankle: Normal pulse.     Comments: Left and right dorsal pedis +1  Skin:    General: Skin is warm and dry.     Capillary Refill: Capillary refill takes less than 2 seconds.     Comments: Left lower leg ulcers crusted with no  drainage present on nonadhering bandage. No edema present. Surrounding skin dark in color without skin breakdown.   Neurological:     Mental Status: She is alert.     Labs reviewed: Basic Metabolic Panel: Recent Labs    03/19/20 1008 07/28/20 1035 08/06/20 1527  NA 143 141 141  K 4.4 4.1 4.2  CL 106 106 107  CO2  _0 GLUCOSE 102* 88 50*  BUN 34* 39* 29*  CREATININE 1.76* 1.94* 1.72*  CALCIUM 9.8 9.4 9.3  TSH 2.57  --   --    Liver Function Tests: Recent Labs    03/19/20 1008  AST 18  ALT 12  BILITOT 0.3  PROT 6.8   No results for input(s): LIPASE, AMYLASE in the last 8760 hours. No results for input(s): AMMONIA in the last 8760 hours. CBC: Recent Labs    03/19/20 1008  WBC 7.2  NEUTROABS 4,673  HGB 10.8*  HCT 34.8*  MCV 88.8  PLT 160   Lipid Panel: Recent Labs    03/19/20 1008  CHOL 137  HDL 41*  LDLCALC 73  TRIG 146  CHOLHDL 3.3   TSH: Recent Labs    03/19/20 1008  TSH 2.57   A1C: Lab Results  Component Value Date   HGBA1C 5.8 (H) 07/28/2020     Assessment/Plan 1. Venous ulcer of left leg (HCC) - stable, no apparent drainage on non adhesive bandage, do not visualize any on skin - new non adhesive bandage applied in office today - advised patient to remove new dressing in 24 hrs and report of any yellow or pink tingled drainage, if there is drainage suggest follow up on 11/30 - continue to elevate legs- recommend 3x daily - continue low sodium diet - continue compression stockings- 12 hours on/12 hours off  2. Chronic venous insufficiency - same as above   Next appt: 12/07/2020 Windell Moulding, [facility] [phone]

## 2020-11-03 NOTE — Patient Instructions (Addendum)
Please remove dressing in 24 hours and see if bandage has any yellow or pink stains. If stains are present please call office to schedule follow up next Tuesday (11/10/2020).   - continue to elevate legs for at least 1 hour / 3 times daily  - continue to follow a low sodium diet  - continue to wear compression stockings daily- 12 hours on/12 hours off   Venous Ulcer A venous ulcer is a shallow sore on your lower leg. Venous ulcer is the most common type of lower leg ulcer. You may have venous ulcers on one leg or on both legs. This condition most often develops around your ankles. This type of ulcer may last for a long time (chronic ulcer) or it may return often (recurrent ulcer). What are the causes? This condition is caused by poor blood flow in your legs. The poor flow causes blood to pool in your legs. This can break the skin, causing an ulcer. What increases the risk? You are more likely to develop this condition if:  You are 3 years of age or older.  You are female.  You are overweight.  You are not active.  You have had a leg ulcer in the past.  You have varicose veins.  You have clots in your lower leg veins (deep vein thrombosis).  You have inflammation of your leg veins (phlebitis).  You have recently been pregnant.  You smoke. What are the signs or symptoms? The main symptom of this condition is an open sore near your ankle. Other symptoms may include:  Swelling.  Thick skin.  Fluid coming from the ulcer.  Bleeding.  Itching.  Pain and swelling. This gets worse when you stand up and feels better when you raise your leg.  Blotchy skin.  Dark skin. How is this treated? This condition may be treated by:  Keeping your leg raised (elevated).  Wearing a type of bandage or stocking to keep pressure (compression) on the veins of your leg.  Taking medicines, including antibiotic medicines.  Cleaning your ulcer and removing any dead tissue from the  wound.  Using bandages and wraps that have medicines in them to cover your ulcer.  Closing the wound using a piece of skin taken from another area of your body (graft). Follow these instructions at home: Medicines  Take or apply over-the-counter and prescription medicines only as told by your doctor.  If you were prescribed an antibiotic medicine, take it as told by your doctor. Do not stop using the antibiotic even if you start to feel better.  Ask your doctor if you should take aspirin before long trips. Wound care  Follow instructions from your doctor about how to take care of your wound. Make sure you: ? Wash your hands with soap and water before and after you change your bandage (dressing). If you cannot use soap and water, use hand sanitizer. ? Change your bandage as told by your doctor. ? If you had a skin graft, leave stitches (sutures) in place. These may need to stay in place for 2 weeks or longer. ? Ask when you should remove your bandage. If your bandage is dry and sticks to your leg when you try to remove it, moisten or wet the bandage with saline solution or water to make it easier to remove.  Once your bandage is off, check your wound each day for signs of infection. Have a caregiver do this for you if you are not able to do  it yourself. Check for: ? More redness, swelling, or pain. ? More fluid or blood. ? Warmth. ? Pus or a bad smell. Activity  Do not sit for a long time without moving. Get up to take short walks every 1-2 hours. This is important. Ask for help if you feel weak or unsteady.  Ask your doctor what level of activity is safe for you.  Rest with your legs raised during the day. If you can, keep your legs above the level of your heart for 30 minutes, 3-4 times a day, or as told by your doctor.  Do not sit with your legs crossed. General instructions   Wear elastic stockings, compression stockings, or support hose as told by your doctor.  Raise the  foot of your bed as told by your doctor.  Do not use any products that contain nicotine or tobacco, such as cigarettes, e-cigarettes, and chewing tobacco. If you need help quitting, ask your doctor.  Keep all follow-up visits as told by your doctor. This is important. Contact a doctor if:  Your ulcer is getting larger or is not healing.  Your pain gets worse. Get help right away if:  You have more redness, swelling, or pain around your ulcer.  You have more fluid or blood coming from your ulcer.  Your ulcer feels warm to the touch.  You have pus or a bad smell coming from your ulcer.  You have a fever. Summary  A venous ulcer is a shallow sore on your lower leg.  Follow instructions from your doctor about how to take care of your wound.  Check your wound each day for signs of infection.  Take over-the-counter and prescription medicines only as told by your doctor.  Keep all follow-up visits as told by your doctor. This is important. This information is not intended to replace advice given to you by your health care provider. Make sure you discuss any questions you have with your health care provider. Document Revised: 07/26/2018 Document Reviewed: 07/26/2018 Elsevier Patient Education  [place].

## 2020-11-10 ENCOUNTER — Encounter: Payer: Self-pay | Admitting: Orthopedic Surgery

## 2020-11-10 ENCOUNTER — Ambulatory Visit (INDEPENDENT_AMBULATORY_CARE_PROVIDER_SITE_OTHER): Payer: Medicare Other | Admitting: Orthopedic Surgery

## 2020-11-10 ENCOUNTER — Other Ambulatory Visit: Payer: Self-pay

## 2020-11-10 VITALS — BP 100/70 | HR 72 | Temp 97.5°F | Resp 20 | Ht 65.0 in | Wt 146.8 lb

## 2020-11-10 DIAGNOSIS — L97929 Non-pressure chronic ulcer of unspecified part of left lower leg with unspecified severity: Secondary | ICD-10-CM

## 2020-11-10 DIAGNOSIS — I872 Venous insufficiency (chronic) (peripheral): Secondary | ICD-10-CM | POA: Diagnosis not present

## 2020-11-10 DIAGNOSIS — I83029 Varicose veins of left lower extremity with ulcer of unspecified site: Secondary | ICD-10-CM | POA: Diagnosis not present

## 2020-11-10 DIAGNOSIS — M79604 Pain in right leg: Secondary | ICD-10-CM

## 2020-11-10 DIAGNOSIS — M79605 Pain in left leg: Secondary | ICD-10-CM

## 2020-11-10 NOTE — Patient Instructions (Signed)
Continue to use compression stocking daily. Continue to follow a low sodium diet.  Contact primary doctor if ulcers become infected or start to weep daily.       Venous Ulcer A venous ulcer is a shallow sore on your lower leg. Venous ulcer is the most common type of lower leg ulcer. You may have venous ulcers on one leg or on both legs. This condition most often develops around your ankles. This type of ulcer may last for a long time (chronic ulcer) or it may return often (recurrent ulcer). What are the causes? This condition is caused by poor blood flow in your legs. The poor flow causes blood to pool in your legs. This can break the skin, causing an ulcer. What increases the risk? You are more likely to develop this condition if:  You are 1 years of age or older.  You are female.  You are overweight.  You are not active.  You have had a leg ulcer in the past.  You have varicose veins.  You have clots in your lower leg veins (deep vein thrombosis).  You have inflammation of your leg veins (phlebitis).  You have recently been pregnant.  You smoke. What are the signs or symptoms? The main symptom of this condition is an open sore near your ankle. Other symptoms may include:  Swelling.  Thick skin.  Fluid coming from the ulcer.  Bleeding.  Itching.  Pain and swelling. This gets worse when you stand up and feels better when you raise your leg.  Blotchy skin.  Dark skin. How is this treated? This condition may be treated by:  Keeping your leg raised (elevated).  Wearing a type of bandage or stocking to keep pressure (compression) on the veins of your leg.  Taking medicines, including antibiotic medicines.  Cleaning your ulcer and removing any dead tissue from the wound.  Using bandages and wraps that have medicines in them to cover your ulcer.  Closing the wound using a piece of skin taken from another area of your body (graft). Follow these instructions  at home: Medicines  Take or apply over-the-counter and prescription medicines only as told by your doctor.  If you were prescribed an antibiotic medicine, take it as told by your doctor. Do not stop using the antibiotic even if you start to feel better.  Ask your doctor if you should take aspirin before long trips. Wound care  Follow instructions from your doctor about how to take care of your wound. Make sure you: ? Wash your hands with soap and water before and after you change your bandage (dressing). If you cannot use soap and water, use hand sanitizer. ? Change your bandage as told by your doctor. ? If you had a skin graft, leave stitches (sutures) in place. These may need to stay in place for 2 weeks or longer. ? Ask when you should remove your bandage. If your bandage is dry and sticks to your leg when you try to remove it, moisten or wet the bandage with saline solution or water to make it easier to remove.  Once your bandage is off, check your wound each day for signs of infection. Have a caregiver do this for you if you are not able to do it yourself. Check for: ? More redness, swelling, or pain. ? More fluid or blood. ? Warmth. ? Pus or a bad smell. Activity  Do not sit for a long time without moving. Get up to take  short walks every 1-2 hours. This is important. Ask for help if you feel weak or unsteady.  Ask your doctor what level of activity is safe for you.  Rest with your legs raised during the day. If you can, keep your legs above the level of your heart for 30 minutes, 3-4 times a day, or as told by your doctor.  Do not sit with your legs crossed. General instructions   Wear elastic stockings, compression stockings, or support hose as told by your doctor.  Raise the foot of your bed as told by your doctor.  Do not use any products that contain nicotine or tobacco, such as cigarettes, e-cigarettes, and chewing tobacco. If you need help quitting, ask your  doctor.  Keep all follow-up visits as told by your doctor. This is important. Contact a doctor if:  Your ulcer is getting larger or is not healing.  Your pain gets worse. Get help right away if:  You have more redness, swelling, or pain around your ulcer.  You have more fluid or blood coming from your ulcer.  Your ulcer feels warm to the touch.  You have pus or a bad smell coming from your ulcer.  You have a fever. Summary  A venous ulcer is a shallow sore on your lower leg.  Follow instructions from your doctor about how to take care of your wound.  Check your wound each day for signs of infection.  Take over-the-counter and prescription medicines only as told by your doctor.  Keep all follow-up visits as told by your doctor. This is important. This information is not intended to replace advice given to you by your health care provider. Make sure you discuss any questions you have with your health care provider. Document Revised: 07/26/2018 Document Reviewed: 07/26/2018 Elsevier Patient Education  [place].

## 2020-11-10 NOTE — Progress Notes (Signed)
Location:      Place of Service:    Provider:  Windell Moulding, AGNP-C  Gayland Curry, DO  Patient Care Team: Gayland Curry, DO as PCP - General (Geriatric Medicine) Clent Jacks, MD as Consulting Physician (Ophthalmology) Sydnee Levans, MD as Referring Physician (Dermatology)  Extended Emergency Contact Information Primary Emergency Contact: Archuleta-Johnson,Lori Address: [address]          [place] Phone: [phone] Mobile Phone: [phone] Relation: None  Goals of care: Advanced Directive information Advanced Directives 11/10/2020  Does Patient Have a Medical Advance Directive? No  Type of Advance Directive -  Does patient want to make changes to medical advance directive? -  Copy of [place] in Chart? -  Would patient like information on creating a medical advance directive? -     Chief Complaint  Patient presents with  . Follow-up    1 week follow up for leg wound    HPI:  Pt is a 86 y.o. female seen today for an acute visit for follow up on venous ulcers.   Seen weekly for the past two weeks. Initially her lower legs became swollen and ulcers started to rupture on her left lower leg. She was advised to purchase compression stockings, elevate lower legs, avoid salt, and follow up weekly. Last week her venous ulcers had improved, but it was unclear if ulcers were still weeping. She was advised to make a follow up appointment if ulcers continued to weep.   Today, she continues to use her compression stockings and is wearing them today. Her thigh high stockings have not arrived in the mail yet. She continues to place a light bandage over her ulcers. Bandage is changed every other day. Yesterday she noticed some light yellow fluid and decided to seek medical attention. Continues to follow low sodium diet and elevates legs in recliner twice daily.   Claims her legs cause her pain if she walks long  distances. Pain resolves with rest. States she has had this pain for along time and does not think it has increased. Does not take pain medication for this issue.       Past Medical History:  Diagnosis Date  . Anemia, unspecified   . Anorexia   . Chronic kidney disease   . Chronic kidney disease, stage III (moderate) (HCC)   . Cystocele, midline   . Disorder of bone and cartilage, unspecified   . Kyphosis associated with other condition   . Obesity, unspecified   . Osteoarthrosis, unspecified whether generalized or localized, unspecified site   . Other and unspecified hyperlipidemia   . Other hammer toe (acquired)   . Postmenopausal atrophic vaginitis   . PVC's (premature ventricular contractions) 01/29/2015  . RBBB 01/29/2015  . Reflux esophagitis   . Type II or unspecified type diabetes mellitus without mention of complication, not stated as uncontrolled   . Unspecified constipation   . Unspecified disorder of kidney and ureter   . Unspecified essential hypertension   . Unspecified hereditary and idiopathic peripheral neuropathy   . Unspecified urinary incontinence    Past Surgical History:  Procedure Laterality Date  . BLADDER SURGERY     tie up in 2013  . EYE SURGERY  11/2016   Bilateral Cataract Sx  . TUBAL LIGATION      Allergies  Allergen Reactions  . Pravastatin Sodium Other (See Comments)    Leg Myalgias   . Keflex [Cephalexin]   .  Metronidazole   . Simvastatin     Leg cramsps    Outpatient Encounter Medications as of 11/10/2020  Medication Sig  . aspirin 81 MG tablet Take 81 mg by mouth daily.  . Blood Glucose Monitoring Suppl (PRECISION XTRA) w/Device KIT 1 strip by In Vitro route daily. DX: E11.29 E11.65  . calcium-vitamin D (OSCAL WITH D) 500-200 MG-UNIT per tablet Take 1 tablet by mouth daily.  . cholecalciferol (VITAMIN D) 1000 UNITS tablet Take 1,000 Units by mouth daily.  Marland Kitchen gabapentin (NEURONTIN) 300 MG capsule TAKE 1 CAPSULE IN THE MORNING AND  TAKE 1 CAPSULE IN THE EVENING FOR PAINS  . glipiZIDE (GLUCOTROL) 5 MG tablet TAKE 1 TABLET DAILY  . glucose blood (PRECISION QID TEST) test strip Precision ultra test strips Use as instructed to test blood sugar once daily DX: E11.29 E11.65  . Lancets (PRECISION ULTRA LANCET) MISC Use as instructed to test blood sugar once daily DX: E11.29 E11.65  . oxyCODONE-acetaminophen (PERCOCET/ROXICET) 5-325 MG tablet Take one tablet by mouth every 8 hours as needed for severe pain.  . TRADJENTA 5 MG TABS tablet TAKE 1 TABLET DAILY  . triamcinolone cream (KENALOG) 0.1 % Apply 1 application topically 2 (two) times daily.  . vitamin E 400 UNIT capsule Take 400 Units by mouth daily.  Marland Kitchen desoximetasone (TOPICORT) 0.25 % cream Apply 1 application topically 2 (two) times daily. (Patient not taking: Reported on 11/03/2020)   No facility-administered encounter medications on file as of 11/10/2020.    Review of Systems  Constitutional: Negative for activity change, appetite change and unexpected weight change.  Respiratory: Negative for cough and shortness of breath.   Cardiovascular: Positive for leg swelling. Negative for chest pain.  Musculoskeletal: Positive for joint swelling and myalgias.       Intermittent right and left leg pain  Skin:       Left lower leg ulcer  Psychiatric/Behavioral: Negative for dysphoric mood. The patient is not nervous/anxious.     Immunization History  Administered Date(s) Administered  . Fluad Quad(high Dose 65+) 08/08/2019, 10/29/2020  . Influenza, High Dose Seasonal PF 09/11/2018  . Influenza,inj,Quad PF,6+ Mos 10/08/2013, 09/30/2014  . Influenza-Unspecified 10/17/2012, 09/12/2015, 10/01/2016, 09/23/2017  . PFIZER SARS-COV-2 Vaccination 01/07/2020, 01/22/2020  . Pneumococcal Conjugate-13 10/31/1997, 07/29/2014  . Pneumococcal Polysaccharide-23 07/30/2015  . Tdap 10/31/1997, 03/12/2014, 10/01/2016  . Zoster 07/30/2014   Pertinent  Health Maintenance Due  Topic Date  Due  . OPHTHALMOLOGY EXAM  12/03/2020  . HEMOGLOBIN A1C  01/28/2021  . FOOT EXAM  03/09/2021  . URINE MICROALBUMIN  10/30/2021  . INFLUENZA VACCINE  Completed  . DEXA SCAN  Completed  . PNA vac Low Risk Adult  Completed   Fall Risk  11/03/2020 10/29/2020 10/22/2020 10/19/2020 08/06/2020  Falls in the past year? 0 0 0 0 0  Number falls in past yr: 0 0 0 0 0  Injury with Fall? 0 0 0 0 0  Comment - - - - -  Risk for fall due to : - - - - -  Follow up - - - - -   Functional Status Survey:    Vitals:   11/10/20 1505  Pulse: 72  Resp: 20  Temp: (!) 97.5 F (36.4 C)  SpO2: 99%  Weight: 146 lb 12.8 oz (66.6 kg)  Height: 5' 5"  (1.651 m)   Body mass index is 24.43 kg/m. Physical Exam Vitals reviewed.  Constitutional:      General: She is not in acute distress.  Appearance: Normal appearance. She is normal weight.  Cardiovascular:     Rate and Rhythm: Normal rate and regular rhythm.     Pulses: Normal pulses.     Heart sounds: Normal heart sounds. No murmur heard.   Pulmonary:     Effort: Pulmonary effort is normal. No respiratory distress.     Breath sounds: Normal breath sounds. No wheezing.  Musculoskeletal:     Right lower leg: 1+ Edema present.     Left lower leg: 1+ Edema present.     Comments: Non-pitting  Skin:    General: Skin is warm and dry.     Capillary Refill: Capillary refill takes less than 2 seconds.       Neurological:     General: No focal deficit present.     Mental Status: She is alert and oriented to person, place, and time. Mental status is at baseline.  Psychiatric:        Mood and Affect: Mood normal.        Behavior: Behavior normal.        Thought Content: Thought content normal.        Judgment: Judgment normal.     Labs reviewed: Recent Labs    03/19/20 1008 07/28/20 1035 08/06/20 1527  NA 143 141 141  K 4.4 4.1 4.2  CL 106 106 107  CO2 29 23 25   GLUCOSE 102* 88 50*  BUN 34* 39* 29*  CREATININE 1.76* 1.94* 1.72*  CALCIUM  9.8 9.4 9.3   Recent Labs    03/19/20 1008  AST 18  ALT 12  BILITOT 0.3  PROT 6.8   Recent Labs    03/19/20 1008  WBC 7.2  NEUTROABS 4,673  HGB 10.8*  HCT 34.8*  MCV 88.8  PLT 160   Lab Results  Component Value Date   TSH 2.57 03/19/2020   Lab Results  Component Value Date   HGBA1C 5.8 (H) 07/28/2020   Lab Results  Component Value Date   CHOL 137 03/19/2020   HDL 41 (L) 03/19/2020   LDLCALC 73 03/19/2020   TRIG 146 03/19/2020   CHOLHDL 3.3 03/19/2020    Significant Diagnostic Results in last 30 days:  No results found.  Assessment/Plan 1. Venous ulcer of left leg ([place]) - resolved at this time  2. Chronic venous insufficiency - stable at this time - continue compression stockings, leg elevated, and low sodium diet - contact PCP if ulcers worsen or begin to weep  3. Pain in both lower extremities - stable, related venous insufficiency, denies increased pain, suspect part of aging and poor circulation  Family/ staff Communication: Plan discussed with patient  Labs/tests ordered: none

## 2020-11-24 ENCOUNTER — Encounter: Payer: Self-pay | Admitting: Podiatry

## 2020-12-01 ENCOUNTER — Other Ambulatory Visit: Payer: Self-pay | Admitting: *Deleted

## 2020-12-01 ENCOUNTER — Other Ambulatory Visit: Payer: Self-pay | Admitting: Internal Medicine

## 2020-12-01 DIAGNOSIS — M48062 Spinal stenosis, lumbar region with neurogenic claudication: Secondary | ICD-10-CM

## 2020-12-01 MED ORDER — OXYCODONE-ACETAMINOPHEN 5-325 MG PO TABS: ORAL_TABLET | ORAL | 0 refills | Status: DC

## 2020-12-01 NOTE — Telephone Encounter (Signed)
Treatment agreement on file from 03/23/2020  RX last filled in Epic on  10/14/2020

## 2020-12-01 NOTE — Telephone Encounter (Signed)
Patient requested refill Epic LR: 10/14/2020 Contract on File Pended Rx and sent to Dr. Mariea Clonts for approval.

## 2020-12-07 ENCOUNTER — Other Ambulatory Visit: Payer: Medicare Other

## 2020-12-07 ENCOUNTER — Other Ambulatory Visit: Payer: Self-pay

## 2020-12-07 DIAGNOSIS — N184 Chronic kidney disease, stage 4 (severe): Secondary | ICD-10-CM

## 2020-12-08 LAB — CBC WITH DIFFERENTIAL/PLATELET
Absolute Monocytes: 533 cells/uL (ref 200–950)
Basophils Absolute: 74 cells/uL (ref 0–200)
Basophils Relative: 0.9 %
Eosinophils Absolute: 738 cells/uL — ABNORMAL HIGH (ref 15–500)
Eosinophils Relative: 9 %
HCT: 34 % — ABNORMAL LOW (ref 35.0–45.0)
Hemoglobin: 10.6 g/dL — ABNORMAL LOW (ref 11.7–15.5)
Lymphs Abs: 2091 cells/uL (ref 850–3900)
MCH: 27.4 pg (ref 27.0–33.0)
MCHC: 31.2 g/dL — ABNORMAL LOW (ref 32.0–36.0)
MCV: 87.9 fL (ref 80.0–100.0)
MPV: 11.6 fL (ref 7.5–12.5)
Monocytes Relative: 6.5 %
Neutro Abs: 4764 cells/uL (ref 1500–7800)
Neutrophils Relative %: 58.1 %
Platelets: 158 10*3/uL (ref 140–400)
RBC: 3.87 10*6/uL (ref 3.80–5.10)
RDW: 14.1 % (ref 11.0–15.0)
Total Lymphocyte: 25.5 %
WBC: 8.2 10*3/uL (ref 3.8–10.8)

## 2020-12-08 LAB — BASIC METABOLIC PANEL WITH GFR
BUN/Creatinine Ratio: 20 (calc) (ref 6–22)
BUN: 28 mg/dL — ABNORMAL HIGH (ref 7–25)
CO2: 28 mmol/L (ref 20–32)
Calcium: 9.4 mg/dL (ref 8.6–10.4)
Chloride: 109 mmol/L (ref 98–110)
Creat: 1.42 mg/dL — ABNORMAL HIGH (ref 0.60–0.88)
GFR, Est African American: 38 mL/min/{1.73_m2} — ABNORMAL LOW (ref >=60)
GFR, Est Non African American: 32 mL/min/{1.73_m2} — ABNORMAL LOW (ref >=60)
Glucose, Bld: 139 mg/dL — ABNORMAL HIGH (ref 65–99)
Potassium: 3.8 mmol/L (ref 3.5–5.3)
Sodium: 146 mmol/L (ref 135–146)

## 2020-12-08 LAB — HEMOGLOBIN A1C
Hgb A1c MFr Bld: 5.6 % of total Hgb (ref <5.7)
Mean Plasma Glucose: 114 mg/dL
eAG (mmol/L): 6.3 mmol/L

## 2020-12-08 NOTE — Progress Notes (Signed)
Sugar is now in normal range. Anemia slightly worse Kidneys improved.

## 2020-12-10 ENCOUNTER — Ambulatory Visit (INDEPENDENT_AMBULATORY_CARE_PROVIDER_SITE_OTHER): Payer: Medicare Other | Admitting: Internal Medicine

## 2020-12-10 ENCOUNTER — Encounter: Payer: Self-pay | Admitting: Internal Medicine

## 2020-12-10 ENCOUNTER — Other Ambulatory Visit: Payer: Self-pay

## 2020-12-10 VITALS — BP 122/72 | HR 77 | Temp 97.9°F | Ht 65.0 in | Wt 145.0 lb

## 2020-12-10 DIAGNOSIS — M159 Polyosteoarthritis, unspecified: Secondary | ICD-10-CM | POA: Diagnosis not present

## 2020-12-10 DIAGNOSIS — E1122 Type 2 diabetes mellitus with diabetic chronic kidney disease: Secondary | ICD-10-CM | POA: Diagnosis not present

## 2020-12-10 DIAGNOSIS — R634 Abnormal weight loss: Secondary | ICD-10-CM | POA: Diagnosis not present

## 2020-12-10 DIAGNOSIS — N184 Chronic kidney disease, stage 4 (severe): Secondary | ICD-10-CM | POA: Diagnosis not present

## 2020-12-10 DIAGNOSIS — M48062 Spinal stenosis, lumbar region with neurogenic claudication: Secondary | ICD-10-CM

## 2020-12-10 DIAGNOSIS — I10 Essential (primary) hypertension: Secondary | ICD-10-CM

## 2020-12-10 DIAGNOSIS — I872 Venous insufficiency (chronic) (peripheral): Secondary | ICD-10-CM

## 2020-12-10 NOTE — Progress Notes (Signed)
Location:  [facility] clinic Provider:  Zakiah Gauthreaux L. Mariea Clonts, D.O., C.M.D.  Goals of Care:  Advanced Directives 12/10/2020  Does Patient Have a Medical Advance Directive? Yes  Type of Paramedic of [place];Living will  Does patient want to make changes to medical advance directive? No - Patient declined  Copy of [place] in Chart? No - copy requested  Would patient like information on creating a medical advance directive? -  I've discussed ACP with her several times and recommended she get her documents in order, but she has concerns about who to choose and has not completed all of this nor shared copies with [place].   Chief Complaint  Patient presents with  . Medical Management of Chronic Issues    4 month follow up     HPI: Patient is a 86 y.o. female seen today for medical management of chronic diseases.    She had a fall out of a chair.  Slid to reach something and slid out.  No major injuries or pain afterward.  Sugar average has normalized.  Down 1.8 lbs more since last visit here.   Does not eat as much.  Will stop glipizide.  Tries to eat three times a day.  Also tries to do two snacks.  Used to do three snacks.    Has two bms per day or none and then next day will go twice again.  Not regular diarrhea.  Has once in a while.    Not having much pain at all that lasts.    I recommended PT for her, but she does not want to come out for it with covid and also does not want to have anyone come in her home and infect her.  Past Medical History:  Diagnosis Date  . Anemia, unspecified   . Anorexia   . Chronic kidney disease   . Chronic kidney disease, stage III (moderate) (HCC)   . Cystocele, midline   . Disorder of bone and cartilage, unspecified   . Kyphosis associated with other condition   . Obesity, unspecified   . Osteoarthrosis, unspecified whether generalized or localized, unspecified site   . Other and unspecified hyperlipidemia   .  Other hammer toe (acquired)   . Postmenopausal atrophic vaginitis   . PVC's (premature ventricular contractions) 01/29/2015  . RBBB 01/29/2015  . Reflux esophagitis   . Type II or unspecified type diabetes mellitus without mention of complication, not stated as uncontrolled   . Unspecified constipation   . Unspecified disorder of kidney and ureter   . Unspecified essential hypertension   . Unspecified hereditary and idiopathic peripheral neuropathy   . Unspecified urinary incontinence     Past Surgical History:  Procedure Laterality Date  . BLADDER SURGERY     tie up in 2013  . EYE SURGERY  11/2016   Bilateral Cataract Sx  . TUBAL LIGATION      Allergies  Allergen Reactions  . Pravastatin Sodium Other (See Comments)    Leg Myalgias   . Keflex [Cephalexin]   . Metronidazole   . Simvastatin     Leg cramsps    Outpatient Encounter Medications as of 12/10/2020  Medication Sig  . aspirin 81 MG tablet Take 81 mg by mouth daily.  . Blood Glucose Monitoring Suppl (PRECISION XTRA) w/Device KIT 1 strip by In Vitro route daily. DX: E11.29 E11.65  . calcium-vitamin D (OSCAL WITH D) 500-200 MG-UNIT per tablet Take 1 tablet by mouth daily.  Marland Kitchen  cholecalciferol (VITAMIN D) 1000 UNITS tablet Take 1,000 Units by mouth daily.  Marland Kitchen gabapentin (NEURONTIN) 300 MG capsule TAKE 1 CAPSULE IN THE MORNING AND TAKE 1 CAPSULE IN THE EVENING FOR PAINS  . glipiZIDE (GLUCOTROL) 5 MG tablet TAKE 1 TABLET DAILY  . glucose blood (PRECISION QID TEST) test strip Precision ultra test strips Use as instructed to test blood sugar once daily DX: E11.29 E11.65  . Lancets (PRECISION ULTRA LANCET) MISC Use as instructed to test blood sugar once daily DX: E11.29 E11.65  . oxyCODONE-acetaminophen (PERCOCET/ROXICET) 5-325 MG tablet Take one tablet by mouth every 8 hours as needed for severe pain.  . TRADJENTA 5 MG TABS tablet TAKE 1 TABLET DAILY  . triamcinolone cream (KENALOG) 0.1 % Apply 1 application topically 2 (two)  times daily.  . vitamin E 400 UNIT capsule Take 400 Units by mouth daily.  . [DISCONTINUED] desoximetasone (TOPICORT) 0.25 % cream Apply 1 application topically 2 (two) times daily. (Patient not taking: Reported on 11/03/2020)   No facility-administered encounter medications on file as of 12/10/2020.    Review of Systems:  Review of Systems  Constitutional: Positive for malaise/fatigue and weight loss.       Not as energetic, moving slower  HENT: Negative for congestion and sore throat.   Eyes: Negative for blurred vision.  Respiratory: Negative for cough and shortness of breath.   Cardiovascular: Positive for leg swelling. Negative for chest pain and palpitations.  Gastrointestinal: Negative for abdominal pain, blood in stool and melena.       Some larger loose bms  Genitourinary: Negative for dysuria.  Musculoskeletal: Positive for falls. Negative for back pain and joint pain.       Denies pain today  Neurological: Negative for dizziness and loss of consciousness.  Psychiatric/Behavioral: Negative for depression and memory loss. The patient is not nervous/anxious and does not have insomnia.     Health Maintenance  Topic Date Due  . COVID-19 Vaccine (3 - Booster) 07/21/2020  . OPHTHALMOLOGY EXAM  12/03/2020  . FOOT EXAM  03/09/2021  . HEMOGLOBIN A1C  06/07/2021  . URINE MICROALBUMIN  10/30/2021  . TETANUS/TDAP  10/01/2026  . INFLUENZA VACCINE  Completed  . DEXA SCAN  Completed  . PNA vac Low Risk Adult  Completed    Physical Exam: Vitals:   12/10/20 1500  BP: 122/72  Pulse: 77  Temp: 97.9 F (36.6 C)  SpO2: 98%  Weight: 145 lb (65.8 kg)  Height: _0  (1.651 m)   Body mass index is 24.13 kg/m. Physical Exam Vitals reviewed.  Constitutional:      General: She is not in acute distress.    Appearance: Normal appearance. She is not toxic-appearing.     Comments: Increasingly frail female walks with walker and uses power scooter long distances  HENT:     Head:  Normocephalic and atraumatic.     Right Ear: External ear normal.     Left Ear: External ear normal.  Eyes:     Conjunctiva/sclera: Conjunctivae normal.     Pupils: Pupils are equal, round, and reactive to light.  Cardiovascular:     Rate and Rhythm: Normal rate and regular rhythm.  Pulmonary:     Effort: Pulmonary effort is normal.     Breath sounds: Normal breath sounds. No wheezing, rhonchi or rales.  Abdominal:     General: Bowel sounds are normal.     Palpations: Abdomen is soft.     Tenderness: There is no abdominal tenderness. There  is no guarding or rebound.  Musculoskeletal:        General: Normal range of motion.     Cervical back: Neck supple.     Right lower leg: Edema present.     Left lower leg: Edema present.  Skin:    Comments: No open or draining ulcerations right now  Neurological:     General: No focal deficit present.     Mental Status: She is alert and oriented to person, place, and time.     Motor: Weakness present.     Gait: Gait abnormal.  Psychiatric:        Mood and Affect: Mood normal.        Behavior: Behavior normal.        Thought Content: Thought content normal.        Judgment: Judgment normal.     Labs reviewed: Basic Metabolic Panel: Recent Labs    03/19/20 1008 07/28/20 1035 08/06/20 1527 12/07/20 0901  NA 143 141 141 146  K 4.4 4.1 4.2 3.8  CL 106 106 107 109  CO2 _0 GLUCOSE 102* 88 50* 139*  BUN 34* 39* 29* 28*  CREATININE 1.76* 1.94* 1.72* 1.42*  CALCIUM 9.8 9.4 9.3 9.4  TSH 2.57  --   --   --    Liver Function Tests: Recent Labs    03/19/20 1008  AST 18  ALT 12  BILITOT 0.3  PROT 6.8   No results for input(s): LIPASE, AMYLASE in the last 8760 hours. No results for input(s): AMMONIA in the last 8760 hours. CBC: Recent Labs    03/19/20 1008 12/07/20 0901  WBC 7.2 8.2  NEUTROABS 4,673 4,764  HGB 10.8* 10.6*  HCT 34.8* 34.0*  MCV 88.8 87.9  PLT 160 158   Lipid Panel: Recent Labs     03/19/20 1008  CHOL 137  HDL 41*  LDLCALC 73  TRIG 146  CHOLHDL 3.3   Lab Results  Component Value Date   HGBA1C 5.6 12/07/2020    Procedures since last visit: No results found.  Assessment/Plan 1. Type 2 diabetes mellitus with stage 4 chronic kidney disease, without long-term current use of insulin (HCC) -too well controlled for her age and frailty status -d/c glipizide to avoid hypoglycemia which can increase fall risk  2. Generalized OA -pain controlled with occasional percocet use  3. Weight loss -seems to be due to frailty at 89; reports bowel pattern has stabilized  4. Chronic venous insufficiency -cont compression stockings on in am and off at hs  5. Spinal stenosis, lumbar region, with neurogenic claudication -again, continue prn percocet, tylenol does not give her relief  6. Benign essential HTN -bp well controlled with current regimen, no changes indicated, monitor  Labs/tests ordered:   Lab Orders  No laboratory test(s) ordered today    Next appt:  05/18/2021 no labs   Jarelle Ates L. Keiri Solano, D.O. [facility] [address],  [address] Cell Phone (Mon-Fri 8am-5pm):  [phone] On Call:  [phone] & follow prompts after 5pm & weekends Office Phone:  [phone] Office Fax:  [phone]

## 2020-12-25 ENCOUNTER — Ambulatory Visit: Payer: Medicare Other | Admitting: Podiatry

## 2021-01-11 ENCOUNTER — Ambulatory Visit: Payer: Medicare Other | Admitting: Podiatry

## 2021-01-21 ENCOUNTER — Other Ambulatory Visit: Payer: Self-pay

## 2021-01-21 DIAGNOSIS — M48062 Spinal stenosis, lumbar region with neurogenic claudication: Secondary | ICD-10-CM

## 2021-01-21 MED ORDER — OXYCODONE-ACETAMINOPHEN 5-325 MG PO TABS: ORAL_TABLET | ORAL | 0 refills | Status: DC

## 2021-01-21 NOTE — Telephone Encounter (Signed)
Incoming call received from patient requesting a refill on her Oxycodone 5-325 mg  RX last filled 12/01/2021 and treatment agreement on file from 03/23/2021

## 2021-02-01 ENCOUNTER — Encounter: Payer: Self-pay | Admitting: Internal Medicine

## 2021-03-15 ENCOUNTER — Other Ambulatory Visit: Payer: Self-pay | Admitting: Internal Medicine

## 2021-03-15 DIAGNOSIS — M48062 Spinal stenosis, lumbar region with neurogenic claudication: Secondary | ICD-10-CM

## 2021-03-15 NOTE — Telephone Encounter (Signed)
Patient has request refill on medication "Oxycodone". Patient last refill was 01/21/2021 with 90 tablets to be taken every 8 hours as need for severe pain. Medication pend and sent to PCP [name], Nelda Bucks, NP . Please Advise.

## 2021-04-14 ENCOUNTER — Ambulatory Visit: Payer: Medicare Other | Admitting: Internal Medicine

## 2021-04-20 ENCOUNTER — Encounter: Payer: Self-pay | Admitting: Adult Health

## 2021-04-20 ENCOUNTER — Telehealth: Payer: Self-pay

## 2021-04-20 ENCOUNTER — Ambulatory Visit (INDEPENDENT_AMBULATORY_CARE_PROVIDER_SITE_OTHER): Payer: Medicare Other | Admitting: Adult Health

## 2021-04-20 ENCOUNTER — Other Ambulatory Visit: Payer: Self-pay

## 2021-04-20 VITALS — BP 138/60 | HR 62 | Temp 97.1°F | Resp 16 | Ht 65.0 in | Wt 146.0 lb

## 2021-04-20 DIAGNOSIS — I10 Essential (primary) hypertension: Secondary | ICD-10-CM

## 2021-04-20 DIAGNOSIS — E1142 Type 2 diabetes mellitus with diabetic polyneuropathy: Secondary | ICD-10-CM | POA: Diagnosis not present

## 2021-04-20 DIAGNOSIS — R197 Diarrhea, unspecified: Secondary | ICD-10-CM

## 2021-04-20 DIAGNOSIS — I872 Venous insufficiency (chronic) (peripheral): Secondary | ICD-10-CM

## 2021-04-20 DIAGNOSIS — M79604 Pain in right leg: Secondary | ICD-10-CM | POA: Diagnosis not present

## 2021-04-20 DIAGNOSIS — M79605 Pain in left leg: Secondary | ICD-10-CM

## 2021-04-20 MED ORDER — FREESTYLE LIBRE 14 DAY READER DEVI: 1.0000 | 0 refills | Status: DC | PRN

## 2021-04-20 MED ORDER — TRIAMCINOLONE ACETONIDE 0.1 % EX CREA: 1.0000 "application " | TOPICAL_CREAM | Freq: Two times a day (BID) | CUTANEOUS | 1 refills | Status: DC

## 2021-04-20 MED ORDER — FREESTYLE LIBRE 14 DAY SENSOR MISC: 1.0000 | 12 refills | Status: DC | PRN

## 2021-04-20 NOTE — Telephone Encounter (Signed)
Called and left message for someone to come to office to pick up specimen cup for c diff screening. Per Elease Etienne, NP.

## 2021-04-20 NOTE — Patient Instructions (Addendum)
Chronic Venous Insufficiency Chronic venous insufficiency is a condition where the leg veins cannot effectively pump blood from the legs to the heart. This happens when the vein walls are either stretched, weakened, or damaged, or when the valves inside the vein are damaged. With the right treatment, you should be able to continue with an active life. This condition is also called venous stasis. What are the causes? Common causes of this condition include:  High blood pressure inside the veins (venous hypertension).  Sitting or standing too long, causing increased blood pressure in the leg veins.  A blood clot that blocks blood flow in a vein (deep vein thrombosis, DVT).  Inflammation of a vein (phlebitis) that causes a blood clot to form.  Tumors in the pelvis that cause blood to back up. What increases the risk? The following factors may make you more likely to develop this condition:  Having a family history of this condition.  Obesity.  Pregnancy.  Living without enough regular physical activity or exercise (sedentary lifestyle).  Smoking.  Having a job that requires long periods of standing or sitting in one place.  Being a certain age. Women in their 67s and 12s and men in their 25s are more likely to develop this condition. What are the signs or symptoms? Symptoms of this condition include:  Veins that are enlarged, bulging, or twisted (varicose veins).  Skin breakdown or ulcers.  Reddened skin or dark discoloration of skin on the leg between the knee and ankle.  Brown, smooth, tight, and painful skin just above the ankle, usually on the inside of the leg (lipodermatosclerosis).  Swelling of the legs. How is this diagnosed? This condition may be diagnosed based on:  Your medical history.  A physical exam.  Tests, such as: ? A procedure that creates an image of a blood vessel and nearby organs and provides information about blood flow through the blood  vessel (duplex ultrasound). ? A procedure that tests blood flow (plethysmography). ? A procedure that looks at the veins using X-ray and dye (venogram). How is this treated? The goals of treatment are to help you return to an active life and to minimize pain or disability. Treatment depends on the severity of your condition, and it may include:  Wearing compression stockings. These can help relieve symptoms and help prevent your condition from getting worse. However, they do not cure the condition.  Sclerotherapy. This procedure involves an injection of a solution that shrinks damaged veins.  Surgery. This may involve: ? Removing a diseased vein (vein stripping). ? Cutting off blood flow through the vein (laser ablation surgery). ? Repairing or reconstructing a valve within the affected vein.   Follow these instructions at home:  Wear compression stockings as told by your health care provider. These stockings help to prevent blood clots and reduce swelling in your legs.  Take over-the-counter and prescription medicines only as told by your health care provider.  Stay active by exercising, walking, or doing different activities. Ask your health care provider what activities are safe for you and how much exercise you need.  Drink enough fluid to keep your urine pale yellow.  Do not use any products that contain nicotine or tobacco, such as cigarettes, e-cigarettes, and chewing tobacco. If you need help quitting, ask your health care provider.  Keep all follow-up visits as told by your health care provider. This is important.      Contact a health care provider if you:  Have redness,  swelling, or more pain in the affected area.  See a red streak or line that goes up or down from the affected area.  Have skin breakdown or skin loss in the affected area, even if the breakdown is small.  Get an injury in the affected area. Get help right away if:  You get an injury and an open wound  in the affected area.  You have: ? Severe pain that does not get better with medicine. ? Sudden numbness or weakness in the foot or ankle below the affected area. ? Trouble moving your foot or ankle. ? A fever. ? Worse or persistent symptoms. ? Chest pain. ? Shortness of breath. Summary  Chronic venous insufficiency is a condition where the leg veins cannot effectively pump blood from the legs to the heart.  Chronic venous insufficiency occurs when the vein walls become stretched, weakened, or damaged, or when valves within the vein are damaged.  Treatment depends on how severe your condition is. It often involves wearing compression stockings and may involve having a procedure.  Make sure you stay active by exercising, walking, or doing different activities. Ask your health care provider what activities are safe for you and how much exercise you need. This information is not intended to replace advice given to you by your health care provider. Make sure you discuss any questions you have with your health care provider. Document Revised: 08/21/2018 Document Reviewed: 08/21/2018 Elsevier Patient Education  2021 [place]aren[employer] and Daroff's neurology in clinical practice (8th ed., pp. [id number] [phone]). Elsevier."> Goldman-Cecil medicine (26th ed., pp. 2489- 2501). Elsevier.">  Peripheral Neuropathy Peripheral neuropathy is a type of nerve damage. It affects nerves that carry signals between the spinal cord and the arms, legs, and the rest of the body (peripheral nerves). It does not affect nerves in the spinal cord or brain. In peripheral neuropathy, one nerve or a group of nerves may be damaged. Peripheral neuropathy is a broad category that includes many specific nerve disorders, like diabetic neuropathy, hereditary neuropathy, and carpal tunnel syndrome. What are the causes? This condition may be caused by:  Diabetes. This is the most common cause of peripheral neuropathy.  Nerve  injury.  Pressure or stress on a nerve that lasts a long time.  Lack (deficiency) of B vitamins. This can result from alcoholism, poor diet, or a restricted diet.  Infections.  Autoimmune diseases, such as rheumatoid arthritis and systemic lupus erythematosus.  Nerve diseases that are passed from parent to child (inherited).  Some medicines, such as cancer medicines (chemotherapy).  Poisonous (toxic) substances, such as lead and mercury.  Too little blood flowing to the legs.  Kidney disease.  Thyroid disease. In some cases, the cause of this condition is not known. What are the signs or symptoms? Symptoms of this condition depend on which of your nerves is damaged. Common symptoms include:  Loss of feeling (numbness) in the feet, hands, or both.  Tingling in the feet, hands, or both.  Burning pain.  Very sensitive skin.  Weakness.  Not being able to move a part of the body (paralysis).  Muscle twitching.  Clumsiness or poor coordination.  Loss of balance.  Not being able to control your bladder.  Feeling dizzy.  Sexual problems. How is this diagnosed? Diagnosing and finding the cause of peripheral neuropathy can be difficult. Your health care provider will take your medical history and do a physical exam. A neurological exam will also be done. This involves  checking things that are affected by your brain, spinal cord, and nerves (nervous system). For example, your health care provider will check your reflexes, how you move, and what you can feel. You may have other tests, such as:  Blood tests.  Electromyogram (EMG) and nerve conduction tests. These tests check nerve function and how well the nerves are controlling the muscles.  Imaging tests, such as CT scans or MRI to rule out other causes of your symptoms.  Removing a small piece of nerve to be examined in a lab (nerve biopsy).  Removing and examining a small amount of the fluid that surrounds the brain  and spinal cord (lumbar puncture). How is this treated? Treatment for this condition may involve:  Treating the underlying cause of the neuropathy, such as diabetes, kidney disease, or vitamin deficiencies.  Stopping medicines that can cause neuropathy, such as chemotherapy.  Medicine to help relieve pain. Medicines may include: ? Prescription or over-the-counter pain medicine. ? Antiseizure medicine. ? Antidepressants. ? Pain-relieving patches that are applied to painful areas of skin.  Surgery to relieve pressure on a nerve or to destroy a nerve that is causing pain.  Physical therapy to help improve movement and balance.  Devices to help you move around (assistive devices). Follow these instructions at home: Medicines  Take over-the-counter and prescription medicines only as told by your health care provider. Do not take any other medicines without first asking your health care provider.  Do not drive or use heavy machinery while taking prescription pain medicine. Lifestyle  Do not use any products that contain nicotine or tobacco, such as cigarettes and e-cigarettes. Smoking keeps blood from reaching damaged nerves. If you need help quitting, ask your health care provider.  Avoid or limit alcohol. Too much alcohol can cause a vitamin B deficiency, and vitamin B is needed for healthy nerves.  Eat a healthy diet. This includes: ? Eating foods that are high in fiber, such as fresh fruits and vegetables, whole grains, and beans. ? Limiting foods that are high in fat and processed sugars, such as fried or sweet foods.   General instructions  If you have diabetes, work closely with your health care provider to keep your blood sugar under control.  If you have numbness in your feet: ? Check every day for signs of injury or infection. Watch for redness, warmth, and swelling. ? Wear padded socks and comfortable shoes. These help protect your feet.  Develop a good support system.  Living with peripheral neuropathy can be stressful. Consider talking with a mental health specialist or joining a support group.  Use assistive devices and attend physical therapy as told by your health care provider. This may include using a walker or a cane.  Keep all follow-up visits as told by your health care provider. This is important.   Contact a health care provider if:  You have new signs or symptoms of peripheral neuropathy.  You are struggling emotionally from dealing with peripheral neuropathy.  Your pain is not well-controlled. Get help right away if:  You have an injury or infection that is not healing normally.  You develop new weakness in an arm or leg.  You have fallen or do so frequently. Summary  Peripheral neuropathy is when the nerves in the arms, or legs are damaged, resulting in numbness, weakness, or pain.  There are many causes of peripheral neuropathy, including diabetes, pinched nerves, vitamin deficiencies, autoimmune disease, and hereditary conditions.  Diagnosing and finding the cause  of peripheral neuropathy can be difficult. Your health care provider will take your medical history, do a physical exam, and do tests, including blood tests and nerve function tests.  Treatment involves treating the underlying cause of the neuropathy and taking medicines to help control pain. Physical therapy and assistive devices may also help. This information is not intended to replace advice given to you by your health care provider. Make sure you discuss any questions you have with your health care provider. Document Revised: 09/08/2020 Document Reviewed: 09/08/2020 Elsevier Patient Education  2021 [employer].

## 2021-04-20 NOTE — Progress Notes (Signed)
[facility] clinic  Provider:   Durenda Age - DNP  Code Status:   Goals of Care:  Advanced Directives 04/20/2021  Does Patient Have a Medical Advance Directive? No  Type of Advance Directive -  Does patient want to make changes to medical advance directive? -  Copy of [place] in Chart? -  Would patient like information on creating a medical advance directive? No - Patient declined     Chief Complaint  Patient presents with  . Medical Management of Chronic Issues    4 month follow up.  Marland Kitchen Health Maintenance    Discuss the need for Eye exam, and Foot exam.   . Immunizations    Discuss the need for Covid Booster.    HPI: Patient is a 87 y.o. female seen today for an acute visit for swelling of her legs and occasional runs in BM. She had BM 3X this morning and watery but didn't move BM day before. She can't smell since when she retired in 2004. She has rashes on her left lower leg which was treated with Triamcinolone and is now wanting a refill. CBG yesterday was 115. She takes Tradjenta 5 mg daily. She denies pain on her fingers but her toes occasionally hurt. She thinks it is due to her closed shoes. Advised her to wear diabetic shoes but she thinks they don't look good. She wants to be referred to another foot doctor. She will make an appointment to see Dr. Katy Fitch, her eye doctor. She said that she had her COVID-19 booster already but can't recall when she had. She will tell her son to give COVID-19 booster info at the front desk    Past Medical History:  Diagnosis Date  . Anemia, unspecified   . Anorexia   . Chronic kidney disease   . Chronic kidney disease, stage III (moderate) (HCC)   . Cystocele, midline   . Disorder of bone and cartilage, unspecified   . Kyphosis associated with other condition   . Obesity, unspecified   . Osteoarthrosis, unspecified whether generalized or localized, unspecified site   . Other and unspecified hyperlipidemia   .  Other hammer toe (acquired)   . Postmenopausal atrophic vaginitis   . PVC's (premature ventricular contractions) 01/29/2015  . RBBB 01/29/2015  . Reflux esophagitis   . Type II or unspecified type diabetes mellitus without mention of complication, not stated as uncontrolled   . Unspecified constipation   . Unspecified disorder of kidney and ureter   . Unspecified essential hypertension   . Unspecified hereditary and idiopathic peripheral neuropathy   . Unspecified urinary incontinence     Past Surgical History:  Procedure Laterality Date  . BLADDER SURGERY     tie up in 2013  . EYE SURGERY  11/2016   Bilateral Cataract Sx  . TUBAL LIGATION      Allergies  Allergen Reactions  . Pravastatin Sodium Other (See Comments)    Leg Myalgias   . Keflex [Cephalexin]   . Metronidazole   . Simvastatin     Leg cramsps    Outpatient Encounter Medications as of 04/20/2021  Medication Sig  . aspirin 81 MG tablet Take 81 mg by mouth daily.  . Blood Glucose Monitoring Suppl (PRECISION XTRA) w/Device KIT 1 strip by In Vitro route daily. DX: E11.29 E11.65  . calcium-vitamin D (OSCAL WITH D) 500-200 MG-UNIT per tablet Take 1 tablet by mouth daily.  . cholecalciferol (VITAMIN D) 1000 UNITS tablet Take 1,000 Units  by mouth daily.  Marland Kitchen gabapentin (NEURONTIN) 300 MG capsule TAKE 1 CAPSULE IN THE MORNING AND TAKE 1 CAPSULE IN THE EVENING FOR PAINS  . glucose blood (PRECISION QID TEST) test strip Precision ultra test strips Use as instructed to test blood sugar once daily DX: E11.29 E11.65  . Lancets (PRECISION ULTRA LANCET) MISC Use as instructed to test blood sugar once daily DX: E11.29 E11.65  . oxyCODONE-acetaminophen (PERCOCET/ROXICET) 5-325 MG tablet Take one tablet by mouth every 8 hours as needed for severe pain.  . TRADJENTA 5 MG TABS tablet TAKE 1 TABLET DAILY  . triamcinolone cream (KENALOG) 0.1 % Apply 1 application topically 2 (two) times daily.  . vitamin E 400 UNIT capsule Take 400 Units by  mouth daily.   No facility-administered encounter medications on file as of 04/20/2021.    Review of Systems:  Review of Systems  Constitutional: Negative for activity change, appetite change and fever.  HENT: Negative for congestion, ear pain and trouble swallowing.   Eyes: Negative for discharge and itching.  Respiratory: Negative for cough, shortness of breath and wheezing.   Cardiovascular: Positive for leg swelling.  Gastrointestinal: Positive for diarrhea. Negative for abdominal distention, abdominal pain, blood in stool and vomiting.  Genitourinary: Negative for difficulty urinating, dysuria and hematuria.  Musculoskeletal: Negative for gait problem.  Neurological: Negative.   Psychiatric/Behavioral: Negative.     Health Maintenance  Topic Date Due  . COVID-19 Vaccine (3 - Booster) 07/21/2020  . OPHTHALMOLOGY EXAM  12/03/2020  . FOOT EXAM  03/09/2021  . HEMOGLOBIN A1C  06/07/2021  . INFLUENZA VACCINE  07/12/2021  . URINE MICROALBUMIN  10/30/2021  . TETANUS/TDAP  10/01/2026  . DEXA SCAN  Completed  . PNA vac Low Risk Adult  Completed  . HPV VACCINES  Aged Out    Physical Exam: Vitals:   04/20/21 1303  Height: 5' 5"  (1.651 m)   Body mass index is 24.13 kg/m. Physical Exam Constitutional:      Appearance: Normal appearance. She is normal weight.  HENT:     Head: Normocephalic and atraumatic.  Eyes:     Conjunctiva/sclera: Conjunctivae normal.  Cardiovascular:     Rate and Rhythm: Normal rate and regular rhythm.  Pulmonary:     Effort: Pulmonary effort is normal.     Breath sounds: Normal breath sounds.  Abdominal:     General: Abdomen is flat. Bowel sounds are normal.  Musculoskeletal:        General: Normal range of motion.     Cervical back: Normal range of motion.     Right lower leg: Edema present.     Left lower leg: Edema present.     Comments: Trace edema on BLE  Skin:    General: Skin is warm.     Comments: Bumpy rashes on LLE  Neurological:      General: No focal deficit present.     Mental Status: She is alert and oriented to person, place, and time.  Psychiatric:        Mood and Affect: Mood normal.        Behavior: Behavior normal.     Labs reviewed: Basic Metabolic Panel: Recent Labs    07/28/20 1035 08/06/20 1527 12/07/20 0901  NA 141 141 146  K 4.1 4.2 3.8  CL 106 107 109  CO2 23 25 28   GLUCOSE 88 50* 139*  BUN 39* 29* 28*  CREATININE 1.94* 1.72* 1.42*  CALCIUM 9.4 9.3 9.4   CBC: Recent Labs  12/07/20 0901  WBC 8.2  NEUTROABS 4,764  HGB 10.6*  HCT 34.0*  MCV 87.9  PLT 158   Lipid Panel:  Lab Results  Component Value Date   HGBA1C 5.6 12/07/2020     Assessment/Plan  1. Chronic venous insufficiency -  Has trace edema BLE -  Continue compression stockings on in the morning and off at night  2. Pain in both lower extremities -  Continue Gabapentin 300 mg BID and Percocet 5-325 mg 1 tab every 8 hours PRN - Ambulatory referral to Podiatry  3. DM type 2 with diabetic peripheral neuropathy (HCC) Lab Results  Component Value Date   HGBA1C 5.6 12/07/2020   -  CBGs stable -  Continue Tradjenta 5 mg daily - Continuous Blood Gluc Receiver (FREESTYLE LIBRE 44 DAY READER) DEVI; 1 each by Does not apply route as needed.  Dispense: 1 each; Refill: 0 - Continuous Blood Gluc Sensor (FREESTYLE LIBRE 14 DAY SENSOR) MISC; 1 each by Does not apply route as needed.  Dispense: 2 each; Refill: 12 - Ambulatory referral to Podiatry  4. Benign essential HTN -   Not on any medications -  stable  5. Diarrhea, unspecified type -  Will need to get stool sample for culture to rule out c-difficile - Clostridium difficile culture-fecal; Future      Labs/tests ordered:  Stool culture for c-difficile  Next appt:  05/24/2021

## 2021-04-21 ENCOUNTER — Ambulatory Visit: Payer: Medicare Other | Admitting: Family

## 2021-04-29 ENCOUNTER — Other Ambulatory Visit: Payer: Self-pay | Admitting: *Deleted

## 2021-04-29 DIAGNOSIS — M48062 Spinal stenosis, lumbar region with neurogenic claudication: Secondary | ICD-10-CM

## 2021-04-29 NOTE — Telephone Encounter (Signed)
Patient requested refill Epic LR: 03/15/2021 Needs updated contract, added to upcoming appointment.  Pended Rx and sent to [facility] for approval. ([name] out of office)

## 2021-04-30 ENCOUNTER — Encounter: Payer: Self-pay | Admitting: Adult Health

## 2021-04-30 ENCOUNTER — Other Ambulatory Visit: Payer: Self-pay

## 2021-04-30 ENCOUNTER — Telehealth: Payer: Self-pay

## 2021-04-30 ENCOUNTER — Telehealth (INDEPENDENT_AMBULATORY_CARE_PROVIDER_SITE_OTHER): Payer: Medicare Other | Admitting: Adult Health

## 2021-04-30 DIAGNOSIS — N1832 Chronic kidney disease, stage 3b: Secondary | ICD-10-CM

## 2021-04-30 DIAGNOSIS — E1142 Type 2 diabetes mellitus with diabetic polyneuropathy: Secondary | ICD-10-CM

## 2021-04-30 DIAGNOSIS — I872 Venous insufficiency (chronic) (peripheral): Secondary | ICD-10-CM

## 2021-04-30 DIAGNOSIS — D649 Anemia, unspecified: Secondary | ICD-10-CM

## 2021-04-30 DIAGNOSIS — R197 Diarrhea, unspecified: Secondary | ICD-10-CM

## 2021-04-30 MED ORDER — OXYCODONE-ACETAMINOPHEN 5-325 MG PO TABS: ORAL_TABLET | ORAL | 0 refills | Status: DC

## 2021-04-30 NOTE — Patient Instructions (Signed)
Chronic Kidney Disease, Adult Chronic kidney disease is when lasting damage happens to the kidneys slowly over a long time. The kidneys help to:  Make pee (urine).  Make hormones.  Keep the right amount of fluids and chemicals in the body. Most often, this disease does not go away. You must take steps to help keep the kidney damage from getting worse. If steps are not taken, the kidneys might stop working forever. What are the causes?  Diabetes.  High blood pressure.  Diseases that affect the heart and blood vessels.  Other kidney diseases.  Diseases of the body's disease-fighting system.  A problem with the flow of pee.  Infections of the organs that make pee, store it, and take it out of the body.  Swelling or irritation of your blood vessels. What increases the risk?  Getting older.  Having someone in your family who has kidney disease or kidney failure.  Having a disease caused by genes.  Taking medicines often that harm the kidneys.  Being near or having contact with harmful substances.  Being very overweight.  Using tobacco now or in the past. What are the signs or symptoms?  Feeling very tired.  Having a swollen face, legs, ankles, or feet.  Feeling like you may vomit or vomiting.  Not feeling hungry.  Being confused or not able to focus.  Twitches and cramps in the leg muscles or other muscles.  Dry, itchy skin.  A taste of metal in your mouth.  Making less pee, or making more pee.  Shortness of breath.  Trouble sleeping. You may also become anemic or get weak bones. Anemic means there is not enough red blood cells or hemoglobin in your blood. You may get symptoms slowly. You may not notice them until the kidney damage gets very bad. How is this treated? Often, there is no cure for this disease. Treatment can help with symptoms and help keep the disease from getting worse. You may need to:  Avoid alcohol.  Avoid foods that are high in  salt, potassium, phosphorous, and protein.  Take medicines for symptoms and to help control other conditions.  Have dialysis. This treatment gets harmful waste out of your body.  Treat other problems that cause your kidney disease or make it worse. Follow these instructions at home: Medicines  Take over-the-counter and prescription medicines only as told by your doctor.  Do not take any new medicines, vitamins, or supplements unless your doctor says it is okay. Lifestyle  Do not smoke or use any products that contain nicotine or tobacco. If you need help quitting, ask your doctor.  If you drink alcohol: ? Limit how much you use to:  0-1 drink a day for women who are not pregnant.  0-2 drinks a day for men. ? Know how much alcohol is in your drink. In the [place] one drink equals one 12 oz bottle of beer (355 mL), one 5 oz glass of wine (148 mL), or one 1 oz glass of hard liquor (44 mL).  Stay at a healthy weight. If you need help losing weight, ask your doctor.   General instructions  Follow instructions from your doctor about what you cannot eat or drink.  Track your blood pressure at home. Tell your doctor about any changes.  If you have diabetes, track your blood sugar.  Exercise at least 30 minutes a day, 5 days a week.  Keep your shots (vaccinations) up to date.  Keep all follow-up visits.     Where to find more information  American Association of Kidney Patients: www.aakp.org  National Kidney Foundation: www.kidney.org  American Kidney Fund: www.akfinc.org  Life Options: www.lifeoptions.org  Kidney School: www.kidneyschool.org Contact a doctor if:  Your symptoms get worse.  You get new symptoms. Get help right away if:  You get symptoms of end-stage kidney disease. These include: ? Headaches. ? Losing feeling in your hands or feet. ? Easy bruising. ? Having hiccups often. ? Chest pain. ? Shortness of breath. ? Lack of menstrual periods, in  women.  You have a fever.  You make less pee than normal.  You have pain or you bleed when you pee or poop. These symptoms may be an emergency. Get help right away. Call your local emergency services (911 in the [place]  Do not wait to see if the symptoms will go away.  Do not drive yourself to the hospital. Summary  Chronic kidney disease is when lasting damage happens to the kidneys slowly over a long time.  Causes of this disease include diabetes and high blood pressure.  Often, there is no cure for this disease. Treatment can help symptoms and help keep the disease from getting worse.  Treatment may involve lifestyle changes, medicines, and dialysis. This information is not intended to replace advice given to you by your health care provider. Make sure you discuss any questions you have with your health care provider. Document Revised: 03/04/2020 Document Reviewed: 03/04/2020 Elsevier Patient Education  2021 [employer] Inc.  

## 2021-04-30 NOTE — Telephone Encounter (Signed)
Ms. Dawn [name], Dawn [name] are scheduled for a virtual visit with your provider today.    Just as we do with appointments in the office, we must obtain your consent to participate.  Your consent will be active for this visit and any virtual visit you may have with one of our providers in the next 365 days.    If you have a MyChart account, I can also send a copy of this consent to you electronically.  All virtual visits are billed to your insurance company just like a traditional visit in the office.  As this is a virtual visit, video technology does not allow for your provider to perform a traditional examination.  This may limit your provider's ability to fully assess your condition.  If your provider identifies any concerns that need to be evaluated in person or the need to arrange testing such as labs, EKG, etc, we will make arrangements to do so.    Although advances in technology are sophisticated, we cannot ensure that it will always work on either your end or our end.  If the connection with a video visit is poor, we may have to switch to a telephone visit.  With either a video or telephone visit, we are not always able to ensure that we have a secure connection.   I need to obtain your verbal consent now.   Are you willing to proceed with your visit today?   [name] has provided verbal consent on 04/30/2021 for a virtual visit (video or telephone).   [name], [place] 04/30/2021  12:56 PM

## 2021-04-30 NOTE — Progress Notes (Signed)
This service is provided via telemedicine  No vital signs collected/recorded due to the encounter was a telemedicine visit.   Location of patient (ex: home, work):  Home  Patient consents to a telephone visit: Yes, see telephone visit dated 04/30/21  Location of the provider (ex: office, home):  [facility], Office   Name of any referring provider:  N/A  Names of all persons participating in the telemedicine service and their role in the encounter: Carroll Kinds, CMA, Durenda Age, NP, and Patient   Time spent on call:  7 min with medical assistant       DATE:  04/30/2021   MRN:  [id number]  BIRTHDAY: Dec 05, 1928   Contact Information    Name Relation Home Work [place]  [phone]  [phone]        Chief Complaint  Patient presents with  . Acute Visit    Follow-up on leg swelling and loose bowels via telephone/video     HISTORY OF PRESENT ILLNESS: This is a 87 year old female who who had a televisit today due to follow-up on leg swelling and loose stools. She stated that her BLE edema is better. She keeps her compression stockings on in the morning and off at bedtime. She said that her blood sugar this morning was 73. She currently takes Tradjenta 5 mg daily. Last hgbA1c 5.6, creatinine 1.42 and GFR 38, 12/07/21. She takes Gabapentin 300 mg 1 capsule in the morning and evening for neuropathy.   PAST MEDICAL HISTORY:  Past Medical History:  Diagnosis Date  . Anemia, unspecified   . Anorexia   . Chronic kidney disease   . Chronic kidney disease, stage III (moderate) (HCC)   . Cystocele, midline   . Disorder of bone and cartilage, unspecified   . Kyphosis associated with other condition   . Obesity, unspecified   . Osteoarthrosis, unspecified whether generalized or localized, unspecified site   . Other and unspecified hyperlipidemia   . Other hammer toe (acquired)   . Postmenopausal atrophic vaginitis    . PVC's (premature ventricular contractions) 01/29/2015  . RBBB 01/29/2015  . Reflux esophagitis   . Type II or unspecified type diabetes mellitus without mention of complication, not stated as uncontrolled   . Unspecified constipation   . Unspecified disorder of kidney and ureter   . Unspecified essential hypertension   . Unspecified hereditary and idiopathic peripheral neuropathy   . Unspecified urinary incontinence      CURRENT MEDICATIONS: Reviewed  Patient's Medications  New Prescriptions   No medications on file  Previous Medications   ASPIRIN 81 MG TABLET    Take 81 mg by mouth daily.   CALCIUM-VITAMIN D (OSCAL WITH D) 500-200 MG-UNIT PER TABLET    Take 1 tablet by mouth daily.   CHOLECALCIFEROL (VITAMIN D) 1000 UNITS TABLET    Take 1,000 Units by mouth daily.   CONTINUOUS BLOOD GLUC RECEIVER (FREESTYLE LIBRE 14 DAY READER) DEVI    1 each by Does not apply route as needed.   CONTINUOUS BLOOD GLUC SENSOR (FREESTYLE LIBRE 14 DAY SENSOR) MISC    1 each by Does not apply route as needed.   GABAPENTIN (NEURONTIN) 300 MG CAPSULE    TAKE 1 CAPSULE IN THE MORNING AND TAKE 1 CAPSULE IN THE EVENING FOR PAINS   OXYCODONE-ACETAMINOPHEN (PERCOCET/ROXICET) 5-325 MG TABLET    Take one tablet by mouth every 8 hours as needed for severe pain   TRADJENTA 5 MG TABS TABLET  TAKE 1 TABLET DAILY   TRIAMCINOLONE CREAM (KENALOG) 0.1 %    Apply 1 application topically 2 (two) times daily.   VITAMIN E 400 UNIT CAPSULE    Take 400 Units by mouth daily.  Modified Medications   No medications on file  Discontinued Medications   No medications on file     Allergies  Allergen Reactions  . Pravastatin Sodium Other (See Comments)    Leg Myalgias   . Keflex [Cephalexin]   . Metronidazole   . Simvastatin     Leg cramsps     REVIEW OF SYSTEMS:  GENERAL: no change in appetite, no fatigue, no weight changes, no fever, chills or weakness MOUTH and THROAT: Denies oral discomfort, gingival pain or  bleeding RESPIRATORY: no cough, SOB, DOE, wheezing, hemoptysis CARDIAC: no chest pain or palpitations GI: no abdominal pain, diarrhea, constipation, heart burn, nausea or vomiting GU: Denies dysuria, frequency, hematuria or discharge NEUROLOGICAL: Denies dizziness, syncope, numbness, or headache PSYCHIATRIC: Denies feeling of depression or anxiety. No report of hallucinations, insomnia, paranoia, or agitation   LABS/RADIOLOGY: Labs reviewed: Basic Metabolic Panel: Recent Labs    07/28/20 1035 08/06/20 1527 12/07/20 0901  NA 141 141 146  K 4.1 4.2 3.8  CL 106 107 109  CO2 23 25 28  GLUCOSE 88 50* 139*  BUN 39* 29* 28*  CREATININE 1.94* 1.72* 1.42*  CALCIUM 9.4 9.3 9.4   CBC: Recent Labs    12/07/20 0901  WBC 8.2  NEUTROABS 4,764  HGB 10.6*  HCT 34.0*  MCV 87.9  PLT 158       ASSESSMENT/PLAN:  1. Chronic kidney disease, stage 3b (HCC) Lab Results  Component Value Date   NA 146 12/07/2020   K 3.8 12/07/2020   CO2 28 12/07/2020   GLUCOSE 139 (H) 12/07/2020   BUN 28 (H) 12/07/2020   CREATININE 1.42 (H) 12/07/2020   CALCIUM 9.4 12/07/2020   GFRNONAA 32 (L) 12/07/2020   GFRAA 38 (L) 12/07/2020   - BMP with eGFR([facility]); Future  2. DM type 2 with diabetic peripheral neuropathy (HCC) Lab Results  Component Value Date   HGBA1C 5.6 12/07/2020   -  CBG this morning 73, will discontinue Tradjenta - Hemoglobin A1C; Future  3. Chronic venous insufficiency -  Has compression stockings in AM and off at HS -  Instructed to elevate BLE when in bed  4. Diarrhea, unspecified type -  Better -  Instructed to pick up collection kit for stool culture for C- difficile as ordered on 04/20/21  5. Anemia, unspecified type Lab Results  Component Value Date   HGB 10.6 (L) 12/07/2020   - CBC With Differential/Platelet; Future      Time spent on non face to face visit:  12 minutes  The patient gave consent to this telephone visit. Explained to the patient the risk  and privacy issue that was involved with this telephone call.   The patient was advised to call back and ask for an in-person evaluation if the symptoms worsen or if the condition fails to improve.   Monina Medina-Vargas, NP [facility] [phone]  

## 2021-05-18 ENCOUNTER — Encounter: Payer: Medicare Other | Admitting: Family

## 2021-05-19 ENCOUNTER — Other Ambulatory Visit: Payer: Self-pay | Admitting: *Deleted

## 2021-05-19 MED ORDER — GABAPENTIN 300 MG PO CAPS: ORAL_CAPSULE | ORAL | 3 refills | Status: AC

## 2021-05-19 NOTE — Telephone Encounter (Signed)
[facility]

## 2021-05-21 ENCOUNTER — Other Ambulatory Visit: Payer: Medicare Other

## 2021-05-21 ENCOUNTER — Other Ambulatory Visit: Payer: Self-pay

## 2021-05-21 DIAGNOSIS — D649 Anemia, unspecified: Secondary | ICD-10-CM | POA: Diagnosis not present

## 2021-05-21 DIAGNOSIS — N1832 Chronic kidney disease, stage 3b: Secondary | ICD-10-CM

## 2021-05-21 DIAGNOSIS — E1142 Type 2 diabetes mellitus with diabetic polyneuropathy: Secondary | ICD-10-CM | POA: Diagnosis not present

## 2021-05-21 DIAGNOSIS — R197 Diarrhea, unspecified: Secondary | ICD-10-CM

## 2021-05-22 LAB — CBC WITH DIFFERENTIAL/PLATELET
Absolute Monocytes: 621 cells/uL (ref 200–950)
Basophils Absolute: 51 cells/uL (ref 0–200)
Basophils Relative: 0.7 %
Eosinophils Absolute: 562 cells/uL — ABNORMAL HIGH (ref 15–500)
Eosinophils Relative: 7.7 %
HCT: 34.5 % — ABNORMAL LOW (ref 35.0–45.0)
Hemoglobin: 10.8 g/dL — ABNORMAL LOW (ref 11.7–15.5)
Lymphs Abs: 2300 cells/uL (ref 850–3900)
MCH: 27.6 pg (ref 27.0–33.0)
MCHC: 31.3 g/dL — ABNORMAL LOW (ref 32.0–36.0)
MCV: 88 fL (ref 80.0–100.0)
MPV: 11.8 fL (ref 7.5–12.5)
Monocytes Relative: 8.5 %
Neutro Abs: 3767 cells/uL (ref 1500–7800)
Neutrophils Relative %: 51.6 %
Platelets: 139 10*3/uL — ABNORMAL LOW (ref 140–400)
RBC: 3.92 10*6/uL (ref 3.80–5.10)
RDW: 14.5 % (ref 11.0–15.0)
Total Lymphocyte: 31.5 %
WBC: 7.3 10*3/uL (ref 3.8–10.8)

## 2021-05-22 LAB — BASIC METABOLIC PANEL WITH GFR
BUN/Creatinine Ratio: 25 (calc) — ABNORMAL HIGH (ref 6–22)
BUN: 36 mg/dL — ABNORMAL HIGH (ref 7–25)
CO2: 28 mmol/L (ref 20–32)
Calcium: 9.9 mg/dL (ref 8.6–10.4)
Chloride: 107 mmol/L (ref 98–110)
Creat: 1.46 mg/dL — ABNORMAL HIGH (ref 0.60–0.88)
GFR, Est African American: 36 mL/min/{1.73_m2} — ABNORMAL LOW (ref >=60)
GFR, Est Non African American: 31 mL/min/{1.73_m2} — ABNORMAL LOW (ref >=60)
Glucose, Bld: 122 mg/dL — ABNORMAL HIGH (ref 65–99)
Potassium: 4.1 mmol/L (ref 3.5–5.3)
Sodium: 145 mmol/L (ref 135–146)

## 2021-05-22 LAB — HEMOGLOBIN A1C
Hgb A1c MFr Bld: 6.2 % of total Hgb — ABNORMAL HIGH (ref <5.7)
Mean Plasma Glucose: 131 mg/dL
eAG (mmol/L): 7.3 mmol/L

## 2021-05-23 NOTE — Progress Notes (Signed)
A1C 6.2, up from 5.6 from 6 months ago  Creatinine 1.46 and GFR 36, same as 5 months ago

## 2021-05-24 ENCOUNTER — Encounter: Payer: Self-pay | Admitting: Family

## 2021-05-24 ENCOUNTER — Telehealth: Payer: Self-pay

## 2021-05-24 ENCOUNTER — Other Ambulatory Visit: Payer: Self-pay

## 2021-05-24 ENCOUNTER — Ambulatory Visit (INDEPENDENT_AMBULATORY_CARE_PROVIDER_SITE_OTHER): Payer: Medicare Other | Admitting: Family

## 2021-05-24 DIAGNOSIS — Z0000 Encounter for general adult medical examination without abnormal findings: Secondary | ICD-10-CM | POA: Diagnosis not present

## 2021-05-24 DIAGNOSIS — E1142 Type 2 diabetes mellitus with diabetic polyneuropathy: Secondary | ICD-10-CM | POA: Diagnosis not present

## 2021-05-24 NOTE — Progress Notes (Signed)
This service is provided via telemedicine  No vital signs collected/recorded due to the encounter was a telemedicine visit.   Location of patient (ex: home, work):  Home  Patient consents to a telephone visit:  Yes.  Location of the provider (ex: office, home): [employer].  Name of any referring provider: Gailene Youkhana, Nelda Bucks, NP   Names of all persons participating in the telemedicine service and their role in the encounter: Patient, Heriberto Antigua, [place], [place], Webb Silversmith, NP.    Time spent on call: 8 minutes spent on the phone with Medical Assistant.     Subjective:   Dawn [name] is a 87 y.o. female who presents for Medicare Annual (Subsequent) preventive examination.  Review of Systems    None  Cardiac Risk Factors include: advanced age (>50mn, >>58women);diabetes mellitus;smoking/ tobacco exposure     Objective:    There were no vitals filed for this visit. There is no height or weight on file to calculate BMI.  Advanced Directives 05/24/2021 04/30/2021 04/20/2021 12/10/2020 11/10/2020 11/03/2020 10/29/2020  Does Patient Have a Medical Advance Directive? No No No Yes No No Yes  Type of Advance Directive - - -Public librarianLiving will - - [place]  Does patient want to make changes to medical advance directive? - - - No - Patient declined - - No - Patient declined  Copy of [place]? - - - No - copy requested - - No - copy requested  Would patient like information on creating a medical advance directive? No - Patient declined No - Patient declined No - Patient declined - - - -    Current Medications (verified) Outpatient Encounter Medications as of 05/24/2021  Medication Sig   aspirin 81 MG tablet Take 81 mg by mouth daily.   calcium-vitamin D (OSCAL WITH D) 500-200 MG-UNIT per tablet Take 1 tablet by mouth daily.   cholecalciferol (VITAMIN D) 1000 UNITS tablet Take 1,000 Units by  mouth daily.   gabapentin (NEURONTIN) 300 MG capsule Take one capsule in the morning and take One capsule in the evening for pains.   Glucose Blood (RELION BLOOD GLUCOSE TEST VI) 1 strip by In Vitro route daily.   oxyCODONE-acetaminophen (PERCOCET/ROXICET) 5-325 MG tablet Take one tablet by mouth every 8 hours as needed for severe pain   triamcinolone cream (KENALOG) 0.1 % Apply 1 application topically 2 (two) times daily.   vitamin E 400 UNIT capsule Take 400 Units by mouth daily.   No facility-administered encounter medications on file as of 05/24/2021.    Allergies (verified) Pravastatin sodium, Keflex [cephalexin], Metronidazole, and Simvastatin   History: Past Medical History:  Diagnosis Date   Anemia, unspecified    Anorexia    Chronic kidney disease    Chronic kidney disease, stage III (moderate) (HCC)    Cystocele, midline    Disorder of bone and cartilage, unspecified    Kyphosis associated with other condition    Obesity, unspecified    Osteoarthrosis, unspecified whether generalized or localized, unspecified site    Other and unspecified hyperlipidemia    Other hammer toe (acquired)    Postmenopausal atrophic vaginitis    PVC's (premature ventricular contractions) 01/29/2015   RBBB 01/29/2015   Reflux esophagitis    Type II or unspecified type diabetes mellitus without mention of complication, not stated as uncontrolled    Unspecified constipation    Unspecified disorder of kidney and ureter    Unspecified essential  hypertension    Unspecified hereditary and idiopathic peripheral neuropathy    Unspecified urinary incontinence    Past Surgical History:  Procedure Laterality Date   BLADDER SURGERY     tie up in 2013   EYE SURGERY  11/2016   Bilateral Cataract Sx   TUBAL LIGATION     Family History  Problem Relation Age of Onset   Heart disease Mother    Heart disease Sister    Cancer Son    Cancer Son    Stomach cancer Neg Hx    Colon cancer Neg Hx     Pancreatic cancer Neg Hx    Social History   Socioeconomic History   Marital status: Widowed    Spouse name: Not on file   Number of children: 5   Years of education: 16   Highest education level: Bachelor's degree (e.g., BA, AB, BS)  Occupational History   Occupation: retired Occupational psychologist  Tobacco Use   Smoking status: Former    Pack years: 0.00    Types: Cigarettes    Quit date: 12/12/1968    Years since quitting: 52.4   Smokeless tobacco: Never   Tobacco comments:    quit in 1970  Vaping Use   Vaping Use: Never used  Substance and Sexual Activity   Alcohol use: No    Alcohol/week: 0.0 standard drinks   Drug use: No   Sexual activity: Never  Other Topics Concern   Not on file  Social History Narrative   Widow - husband died 67   Lives one story house -son and grandson lives with her   Former smoker -stopped 1970   Alcohol none   Exercise -some walking   POA, Living Will   Social Determinants of Health   Financial Resource Strain: Not on file  Food Insecurity: Not on file  Transportation Needs: Not on file  Physical Activity: Not on file  Stress: Not on file  Social Connections: Not on file    Tobacco Counseling Counseling given: Not Answered Tobacco comments: quit in 1970   Clinical Intake:  Pre-visit preparation completed: No  Pain : No/denies pain     BMI - recorded: 24.3 Nutritional Status: BMI of 19-24  Normal Nutritional Risks: None Diabetes: Yes CBG done?: Yes (130) CBG resulted in Enter/ Edit results?: No (reports CBG 130) Did pt. bring in CBG monitor from home?: No (reported on the phone)  How often do you need to have someone help you when you read instructions, pamphlets, or other written materials from your doctor or pharmacy?: 1 - Never What is the last grade level you completed in school?: College  Diabetic? Yes   Interpreter Needed?: No  Information entered by :: [place] of Daily  Living In your present state of health, do you have any difficulty performing the following activities: 05/24/2021  Hearing? Y  Comment has hearing aids but does not wear it  Vision? N  Difficulty concentrating or making decisions? N  Walking or climbing stairs? N  Dressing or bathing? N  Doing errands, shopping? Y  Comment family assist with driving  Preparing Food and eating ? Y  Comment Family assist  Using the Toilet? N  In the past six months, have you accidently leaked urine? Y  Comment wears depends  Do you have problems with loss of bowel control? N  Managing your Medications? N  Managing your Finances? Y  Comment family assist  Housekeeping or  managing your Housekeeping? Y  Comment family assist  Some recent data might be hidden    Patient Care Team: Mikea Quadros, Nelda Bucks, NP as PCP - General (Family Medicine) Clent Jacks, MD as Consulting Physician (Ophthalmology) Sydnee Levans, MD as Referring Physician (Dermatology)  Indicate any recent Medical Services you may have received from other than [facility] providers in the past year (date may be approximate).     Assessment:   This is a routine wellness examination for Sibel.  Hearing/Vision screen Hearing Screening - Comments:: No Hearing Concerns. Patient doesn't wear hearing aids.  Vision Screening - Comments:: Some vision concerns. Last eye exam was December 2020. Patient wears prescription glasses.   Dietary issues and exercise activities discussed: Current Exercise Habits: Home exercise routine, Type of exercise: stretching, Time (Minutes): 10, Frequency (Times/Week): 3, Weekly Exercise (Minutes/Week): 30, Intensity: Mild, Exercise limited by: None identified   Goals Addressed             This Visit's Progress    <enter goal here>   On track    Starting 02/02/17, I will maintain my current lifestyle.         Depression Screen PHQ 2/9 Scores 05/24/2021 12/10/2020 10/29/2020 10/19/2020 05/08/2020 08/08/2019  05/08/2019  PHQ - 2 Score 0 0 0 0 0 0 0    Fall Risk Fall Risk  05/24/2021 04/20/2021 12/10/2020 11/03/2020 10/29/2020  Falls in the past year? 0 0 1 0 0  Comment - - fell out of a chair - -  Number falls in past yr: 0 0 0 0 0  Injury with Fall? 0 0 0 0 0  Comment - - - - -  Risk for fall due to : - - - - -  Follow up - - - - -    [place]:  Any stairs in or around the home? No  If so, are there any without handrails? No  Home free of loose throw rugs in walkways, pet beds, electrical cords, etc? No  Adequate lighting in your home to reduce risk of falls? Yes   ASSISTIVE DEVICES UTILIZED TO PREVENT FALLS:  Life alert? No  Use of a cane, walker or w/c? Yes  Grab bars in the bathroom? No  Shower chair or bench in shower? Yes  Elevated toilet seat or a handicapped toilet? Yes   TIMED UP AND GO:  Was the test performed? No .  Length of time to ambulate 10 feet: N/A sec.   Gait unsteady without use of assistive device, provider informed and interventions were implemented  Cognitive Function: MMSE - Mini Mental State Exam 02/08/2018 02/02/2017 02/01/2016 01/06/2015  Orientation to time '5 5 5 5  '$ Orientation to Place '5 5 5 5  '$ Registration '3 3 3 3  '$ Attention/ Calculation '5 5 5 5  '$ Recall '3 3 3 2  '$ Language- name 2 objects '2 2 2 2  '$ Language- repeat '1 1 1 1  '$ Language- follow 3 step command '3 3 3 3  '$ Language- read & follow direction '1 1 1 1  '$ Write a sentence '1 1 1 1  '$ Copy design '1 1 1 1  '$ Total score '30 30 30 29     '$ 6CIT Screen 05/24/2021 05/08/2020 05/08/2019  What Year? 0 points 0 points 0 points  What month? 0 points 0 points 0 points  What time? 0 points 0 points 0 points  Count back from 20 0 points 2 points 0 points  Months in  reverse 0 points 0 points 0 points  Repeat phrase 0 points 8 points 6 points  Total Score 0 10 6    Immunizations Immunization History  Administered Date(s) Administered   Fluad Quad(high Dose 65+) 08/08/2019,  10/29/2020   Influenza, High Dose Seasonal PF 09/11/2018   Influenza,inj,Quad PF,6+ Mos 10/08/2013, 09/30/2014   Influenza-Unspecified 10/17/2012, 09/12/2015, 10/01/2016, 09/23/2017   PFIZER(Purple Top)SARS-COV-2 Vaccination 01/07/2020, 01/22/2020, 09/09/2020, 03/25/2021   Pneumococcal Conjugate-13 10/31/1997, 07/29/2014   Pneumococcal Polysaccharide-23 07/30/2015   Tdap 10/31/1997, 03/12/2014, 10/01/2016   Zoster, Live 07/30/2014    TDAP status: Up to date  Flu Vaccine status: Up to date  Pneumococcal vaccine status: Up to date  Covid-19 vaccine status: Completed vaccines  Qualifies for Shingles Vaccine? Yes   Zostavax completed No   Shingrix Completed?: No.    Education has been provided regarding the importance of this vaccine. Patient has been advised to call insurance company to determine out of pocket expense if they have not yet received this vaccine. Advised may also receive vaccine at local pharmacy or Health Dept. Verbalized acceptance and understanding.  Screening Tests Health Maintenance  Topic Date Due   Zoster Vaccines- Shingrix (1 of 2) Never done   OPHTHALMOLOGY EXAM  12/03/2020   FOOT EXAM  03/09/2021   INFLUENZA VACCINE  07/12/2021   URINE MICROALBUMIN  10/30/2021   HEMOGLOBIN A1C  Nov 22, 2021   TETANUS/TDAP  10/01/2026   DEXA SCAN  Completed   COVID-19 Vaccine  Completed   PNA vac Low Risk Adult  Completed   HPV VACCINES  Aged Out    Health Maintenance  Health Maintenance Due  Topic Date Due   Zoster Vaccines- Shingrix (1 of 2) Never done   OPHTHALMOLOGY EXAM  12/03/2020   FOOT EXAM  03/09/2021    Colorectal cancer screening: No longer required.   Mammogram status: No longer required due to advance age.  Bone Density status: Completed 10/27/2017. Results reflect: Bone density results: OSTEOPENIA. Repeat every 2 years.  Lung Cancer Screening: (Low Dose CT Chest recommended if Age 63-80 years, 30 pack-year currently smoking OR have quit w/in  15years.) does not qualify.   Lung Cancer Screening Referral:No   Additional Screening:  Hepatitis C Screening: does not qualify; Completed yes   Vision Screening: Recommended annual ophthalmology exams for early detection of glaucoma and other disorders of the eye. Is the patient up to date with their annual eye exam?  No  Who is the provider or what is the name of the office in which the patient attends annual eye exams? Dr.Groat  If pt is not established with a provider, would they like to be referred to a provider to establish care? No .   Dental Screening: Recommended annual dental exams for proper oral hygiene  Community Resource Referral / Chronic Care Management: CRR required this visit?  No   CCM required this visit?  No      Plan:     I have personally reviewed and noted the following in the patient's chart:   Medical and social history Use of alcohol, tobacco or illicit drugs  Current medications and supplements including opioid prescriptions.  Functional ability and status Nutritional status Physical activity Advanced directives List of other physicians Hospitalizations, surgeries, and ER visits in previous 12 months Vitals Screenings to include cognitive, depression, and falls Referrals and appointments  In addition, I have reviewed and discussed with patient certain preventive protocols, quality metrics, and best practice recommendations. A written personalized care plan for  preventive services as well as general preventive health recommendations were provided to patient.     Sandrea Hughs, NP   05/24/2021   Nurse Notes: - request referral to Dr.Groat for annual eye exam and also podiatrist for annual foot exam.states missed previous appointment.

## 2021-05-24 NOTE — Patient Instructions (Signed)
Dawn [name] ,  Thank you for taking time to come for your Medicare Wellness Visit. I appreciate your ongoing commitment to your health goals. Please review the following plan we discussed and let me know if I can assist you in the future.   Screening recommendations/referrals: Colonoscopy N/A Mammogram: N/A  Bone Density: Up to date  Recommended yearly ophthalmology/optometry visit for glaucoma screening and checkup Recommended yearly dental visit for hygiene and checkup  Vaccinations: Influenza vaccine up to date  Pneumococcal vaccine Up to date  Tdap vaccine Up to date  Shingles vaccine Due please get shingrix vaccine at your pharmacy.  Advanced directives: No   Conditions/risks identified: Advance age female > 25 yrs , Type 2 Diabetes Mellitus and Hx of smoking   Next appointment: 1 year    Preventive Care 85 Years and Older, Female Preventive care refers to lifestyle choices and visits with your health care provider that can promote health and wellness. What does preventive care include? A yearly physical exam. This is also called an annual well check. Dental exams once or twice a year. Routine eye exams. Ask your health care provider how often you should have your eyes checked. Personal lifestyle choices, including: Daily care of your teeth and gums. Regular physical activity. Eating a healthy diet. Avoiding tobacco and drug use. Limiting alcohol use. Practicing safe sex. Taking low-dose aspirin every day. Taking vitamin and mineral supplements as recommended by your health care provider. What happens during an annual well check? The services and screenings done by your health care provider during your annual well check will depend on your age, overall health, lifestyle risk factors, and family history of disease. Counseling  Your health care provider may ask you questions about your: Alcohol use. Tobacco use. Drug use. Emotional well-being. Home and relationship  well-being. Sexual activity. Eating habits. History of falls. Memory and ability to understand (cognition). Work and work Statistician. Reproductive health. Screening  You may have the following tests or measurements: Height, weight, and BMI. Blood pressure. Lipid and cholesterol levels. These may be checked every 5 years, or more frequently if you are over 50 years old. Skin check. Lung cancer screening. You may have this screening every year starting at age 22 if you have a 30-pack-year history of smoking and currently smoke or have quit within the past 15 years. Fecal occult blood test (FOBT) of the stool. You may have this test every year starting at age 65. Flexible sigmoidoscopy or colonoscopy. You may have a sigmoidoscopy every 5 years or a colonoscopy every 10 years starting at age 18. Hepatitis C blood test. Hepatitis B blood test. Sexually transmitted disease (STD) testing. Diabetes screening. This is done by checking your blood sugar (glucose) after you have not eaten for a while (fasting). You may have this done every 1-3 years. Bone density scan. This is done to screen for osteoporosis. You may have this done starting at age 58. Mammogram. This may be done every 1-2 years. Talk to your health care provider about how often you should have regular mammograms. Talk with your health care provider about your test results, treatment options, and if necessary, the need for more tests. Vaccines  Your health care provider may recommend certain vaccines, such as: Influenza vaccine. This is recommended every year. Tetanus, diphtheria, and acellular pertussis (Tdap, Td) vaccine. You may need a Td booster every 10 years. Zoster vaccine. You may need this after age 70. Pneumococcal 13-valent conjugate (PCV13) vaccine. One dose is recommended after  age 20. Pneumococcal polysaccharide (PPSV23) vaccine. One dose is recommended after age 55. Talk to your health care provider about which  screenings and vaccines you need and how often you need them. This information is not intended to replace advice given to you by your health care provider. Make sure you discuss any questions you have with your health care provider. Document Released: 12/25/2015 Document Revised: 08/17/2016 Document Reviewed: 09/29/2015 Elsevier Interactive Patient Education  2017 [employer] Prevention in the Home Falls can cause injuries. They can happen to people of all ages. There are many things you can do to make your home safe and to help prevent falls. What can I do on the outside of my home? Regularly fix the edges of walkways and driveways and fix any cracks. Remove anything that might make you trip as you walk through a door, such as a raised step or threshold. Trim any bushes or trees on the path to your home. Use bright outdoor lighting. Clear any walking paths of anything that might make someone trip, such as rocks or tools. Regularly check to see if handrails are loose or broken. Make sure that both sides of any steps have handrails. Any raised decks and porches should have guardrails on the edges. Have any leaves, snow, or ice cleared regularly. Use sand or salt on walking paths during winter. Clean up any spills in your garage right away. This includes oil or grease spills. What can I do in the bathroom? Use night lights. Install grab bars by the toilet and in the tub and shower. Do not use towel bars as grab bars. Use non-skid mats or decals in the tub or shower. If you need to sit down in the shower, use a plastic, non-slip stool. Keep the floor dry. Clean up any water that spills on the floor as soon as it happens. Remove soap buildup in the tub or shower regularly. Attach bath mats securely with double-sided non-slip rug tape. Do not have throw rugs and other things on the floor that can make you trip. What can I do in the bedroom? Use night lights. Make sure that you have a  light by your bed that is easy to reach. Do not use any sheets or blankets that are too big for your bed. They should not hang down onto the floor. Have a firm chair that has side arms. You can use this for support while you get dressed. Do not have throw rugs and other things on the floor that can make you trip. What can I do in the kitchen? Clean up any spills right away. Avoid walking on wet floors. Keep items that you use a lot in easy-to-reach places. If you need to reach something above you, use a strong step stool that has a grab bar. Keep electrical cords out of the way. Do not use floor polish or wax that makes floors slippery. If you must use wax, use non-skid floor wax. Do not have throw rugs and other things on the floor that can make you trip. What can I do with my stairs? Do not leave any items on the stairs. Make sure that there are handrails on both sides of the stairs and use them. Fix handrails that are broken or loose. Make sure that handrails are as long as the stairways. Check any carpeting to make sure that it is firmly attached to the stairs. Fix any carpet that is loose or worn. Avoid having throw rugs  at the top or bottom of the stairs. If you do have throw rugs, attach them to the floor with carpet tape. Make sure that you have a light switch at the top of the stairs and the bottom of the stairs. If you do not have them, ask someone to add them for you. What else can I do to help prevent falls? Wear shoes that: Do not have high heels. Have rubber bottoms. Are comfortable and fit you well. Are closed at the toe. Do not wear sandals. If you use a stepladder: Make sure that it is fully opened. Do not climb a closed stepladder. Make sure that both sides of the stepladder are locked into place. Ask someone to hold it for you, if possible. Clearly mark and make sure that you can see: Any grab bars or handrails. First and last steps. Where the edge of each step  is. Use tools that help you move around (mobility aids) if they are needed. These include: Canes. Walkers. Scooters. Crutches. Turn on the lights when you go into a dark area. Replace any light bulbs as soon as they burn out. Set up your furniture so you have a clear path. Avoid moving your furniture around. If any of your floors are uneven, fix them. If there are any pets around you, be aware of where they are. Review your medicines with your doctor. Some medicines can make you feel dizzy. This can increase your chance of falling. Ask your doctor what other things that you can do to help prevent falls. This information is not intended to replace advice given to you by your health care provider. Make sure you discuss any questions you have with your health care provider. Document Released: 09/24/2009 Document Revised: 05/05/2016 Document Reviewed: 01/02/2015 Elsevier Interactive Patient Education  2017 [employer].

## 2021-05-24 NOTE — Telephone Encounter (Signed)
Ms. Dawn [name], Dawn [name] are scheduled for a virtual visit with your provider today.    Just as we do with appointments in the office, we must obtain your consent to participate.  Your consent will be active for this visit and any virtual visit you may have with one of our providers in the next 365 days.    If you have a MyChart account, I can also send a copy of this consent to you electronically.  All virtual visits are billed to your insurance company just like a traditional visit in the office.  As this is a virtual visit, video technology does not allow for your provider to perform a traditional examination.  This may limit your provider's ability to fully assess your condition.  If your provider identifies any concerns that need to be evaluated in person or the need to arrange testing such as labs, EKG, etc, we will make arrangements to do so.    Although advances in technology are sophisticated, we cannot ensure that it will always work on either your end or our end.  If the connection with a video visit is poor, we may have to switch to a telephone visit.  With either a video or telephone visit, we are not always able to ensure that we have a secure connection.   I need to obtain your verbal consent now.   Are you willing to proceed with your visit today?   Dawn [name] has provided verbal consent on 05/24/2021 for a virtual visit (video or telephone).   Dawn [name], [place] 05/24/2021  3:33 PM

## 2021-05-28 ENCOUNTER — Other Ambulatory Visit: Payer: Self-pay | Admitting: Adult Health

## 2021-05-28 LAB — CLOSTRIDIUM DIFFICILE CULTURE-FECAL

## 2021-05-28 LAB — C.DIFF TOXINB QL PCR: CLOSTRIDIUM DIFFICILE TOXINB,QL REAL TIME PCR: NOT DETECTED

## 2021-05-28 MED ORDER — FIDAXOMICIN 200 MG PO TABS: 200.0000 mg | ORAL_TABLET | Freq: Two times a day (BID) | ORAL | 0 refills | Status: AC

## 2021-05-28 NOTE — Progress Notes (Signed)
Stool culture was positive for C-difficile. I have sent prescription for Fidaxomicin 200 mg tab BID X 10 days at [facility].

## 2021-06-15 ENCOUNTER — Telehealth: Payer: Self-pay | Admitting: Family

## 2021-06-15 NOTE — Telephone Encounter (Signed)
Pt calling to update Monina on some advice that she shared with her & the outcome:  Ms Hegeman wants her to know that the medication she advised for her bowels worked & she wants Monina to call her personally.  Please advise  Dawn [name]

## 2021-06-18 ENCOUNTER — Other Ambulatory Visit: Payer: Self-pay | Admitting: Adult Health

## 2021-06-18 DIAGNOSIS — M48062 Spinal stenosis, lumbar region with neurogenic claudication: Secondary | ICD-10-CM

## 2021-06-18 NOTE — Telephone Encounter (Signed)
Patient requested refill.  Epic LR: 04/30/2021 Needs updated contract, added note to upcoming appointment.  Pended Rx and sent to [facility] for approval.

## 2021-07-16 ENCOUNTER — Ambulatory Visit: Payer: Medicare Other | Admitting: Podiatry

## 2021-07-26 ENCOUNTER — Ambulatory Visit (INDEPENDENT_AMBULATORY_CARE_PROVIDER_SITE_OTHER): Payer: Medicare Other | Admitting: Podiatry

## 2021-07-26 ENCOUNTER — Encounter: Payer: Self-pay | Admitting: Podiatry

## 2021-07-26 ENCOUNTER — Other Ambulatory Visit: Payer: Self-pay

## 2021-07-26 DIAGNOSIS — M79609 Pain in unspecified limb: Secondary | ICD-10-CM | POA: Diagnosis not present

## 2021-07-26 DIAGNOSIS — E1142 Type 2 diabetes mellitus with diabetic polyneuropathy: Secondary | ICD-10-CM | POA: Diagnosis not present

## 2021-07-26 DIAGNOSIS — L84 Corns and callosities: Secondary | ICD-10-CM | POA: Diagnosis not present

## 2021-07-26 DIAGNOSIS — B351 Tinea unguium: Secondary | ICD-10-CM | POA: Diagnosis not present

## 2021-07-31 NOTE — Progress Notes (Signed)
Subjective: Dawn [name] is a pleasant 87 y.o. female patient seen today for diabetic foot care. She is seen for painful calluses of both feet and  painful thick toenails that are difficult to trim. Pain interferes with ambulation. Aggravating factors include wearing enclosed shoe gear. Pain is relieved with periodic professional debridement.  Patient states her blood glucose reading was 131 mg/dl this morning. She voices no new pedal problems on today's visit.  PCP is [name], Nelda Bucks, NP. Last visit was: 05/24/2021.  Allergies  Allergen Reactions   Pravastatin Sodium Other (See Comments)    Leg Myalgias    Keflex [Cephalexin]    Metronidazole    Simvastatin     Leg cramsps   Objective: Physical Exam  General: Lorine Berish is a pleasant 87 y.o. African American female, WD, WN in NAD. AAO x 3.   Vascular:  Capillary fill time to digits <3 seconds b/l lower extremities. Faintly palpable DP pulse(s) b/l lower extremities. Faintly palpable PT pulse(s) b/l lower extremities. Pedal hair absent. Lower extremity skin temperature gradient within normal limits. No pain with calf compression b/l. No edema noted b/l lower extremities. Evidence of chronic venous insufficiency b/l lower extremities.  Dermatological:  Skin warm and supple b/l lower extremities. No open wounds b/l lower extremities. No interdigital macerations b/l lower extremities. Toenails 1-5 b/l elongated, discolored, dystrophic, thickened, crumbly with subungual debris and tenderness to dorsal palpation. Hyperkeratotic lesion(s) R 5th toe, submet head 5 left foot, and submet head 5 right foot.  No erythema, no edema, no drainage, no fluctuance. Evidence of chronic venous insufficiency b/l lower extremities. Skin changes consistent with venous stasis noted b/l lower extremities.  Musculoskeletal:  Normal muscle strength 5/5 to all lower extremity muscle groups bilaterally. Hallux valgus with bunion deformity noted b/l lower  extremities. Hammertoe(s) noted to the 2-5 bilaterally. Pes planus deformity noted b/l lower extremities.  Neurological:  Protective sensation diminished with 10g monofilament b/l. Vibratory sensation diminished b/l.  Assessment and Plan:  1. Pain due to onychomycosis of nail   2. Corns and callosities   3. Diabetic peripheral neuropathy associated with type 2 diabetes mellitus ([place])    -Examined patient. -Continue diabetic foot care principles: inspect feet daily, monitor glucose as recommended by PCP and/or Endocrinologist, and follow prescribed diet per PCP, Endocrinologist and/or dietician. -Patient to continue soft, supportive shoe gear daily. -Toenails 1-5 b/l were debrided in length and girth with sterile nail nippers and dremel without iatrogenic bleeding.  -Corn(s) R 5th toe and callus(es) submet head 5 left foot and submet head 5 right foot were pared utilizing sterile scalpel blade without incident. Total number debrided =3. -Patient to report any pedal injuries to medical professional immediately. -Patient/POA to call should there be question/concern in the interim.  Return in about 3 months (around 10/26/2021).  Marzetta Board, DPM

## 2021-08-02 ENCOUNTER — Other Ambulatory Visit: Payer: Medicare Other

## 2021-08-23 ENCOUNTER — Other Ambulatory Visit: Payer: Self-pay

## 2021-08-23 ENCOUNTER — Other Ambulatory Visit: Payer: Medicare Other

## 2021-08-23 ENCOUNTER — Telehealth: Payer: Self-pay | Admitting: *Deleted

## 2021-08-23 NOTE — Telephone Encounter (Signed)
Diabetic shoe  measurement-08/23/21

## 2021-08-25 ENCOUNTER — Other Ambulatory Visit: Payer: Self-pay | Admitting: Family

## 2021-08-25 ENCOUNTER — Encounter: Payer: Medicare Other | Admitting: Family

## 2021-08-25 DIAGNOSIS — M48062 Spinal stenosis, lumbar region with neurogenic claudication: Secondary | ICD-10-CM

## 2021-08-25 NOTE — Telephone Encounter (Signed)
Pharmacy requested refill and Patient called to request refill.   Informed patient that she missed an appointment that was scheduled for today and she was unaware of the appointment. Stated that she will call back to reschedule.   Epic LR: 06/18/2021 Contract needs to be updated.  Pended Rx and sent to [facility] for approval.

## 2021-08-25 NOTE — Telephone Encounter (Signed)
Last filled on 06/18/2021. No treatment agreement on file. Patient missed appointment today to sign treatment agreement.  The administrative staff will call patient to reschedule missed appointment.

## 2021-08-26 ENCOUNTER — Other Ambulatory Visit: Payer: Self-pay

## 2021-08-26 ENCOUNTER — Ambulatory Visit (INDEPENDENT_AMBULATORY_CARE_PROVIDER_SITE_OTHER): Payer: Medicare Other | Admitting: Family

## 2021-08-26 ENCOUNTER — Encounter: Payer: Self-pay | Admitting: Family

## 2021-08-26 VITALS — BP 110/70 | HR 70 | Temp 97.1°F | Resp 16 | Ht 65.0 in | Wt 130.2 lb

## 2021-08-26 DIAGNOSIS — I872 Venous insufficiency (chronic) (peripheral): Secondary | ICD-10-CM

## 2021-08-26 DIAGNOSIS — D649 Anemia, unspecified: Secondary | ICD-10-CM | POA: Diagnosis not present

## 2021-08-26 DIAGNOSIS — E782 Mixed hyperlipidemia: Secondary | ICD-10-CM

## 2021-08-26 DIAGNOSIS — R600 Localized edema: Secondary | ICD-10-CM

## 2021-08-26 DIAGNOSIS — R634 Abnormal weight loss: Secondary | ICD-10-CM | POA: Diagnosis not present

## 2021-08-26 DIAGNOSIS — N184 Chronic kidney disease, stage 4 (severe): Secondary | ICD-10-CM

## 2021-08-26 DIAGNOSIS — Z23 Encounter for immunization: Secondary | ICD-10-CM | POA: Diagnosis not present

## 2021-08-26 DIAGNOSIS — E1122 Type 2 diabetes mellitus with diabetic chronic kidney disease: Secondary | ICD-10-CM

## 2021-08-26 DIAGNOSIS — G894 Chronic pain syndrome: Secondary | ICD-10-CM | POA: Diagnosis not present

## 2021-08-26 DIAGNOSIS — M48062 Spinal stenosis, lumbar region with neurogenic claudication: Secondary | ICD-10-CM

## 2021-08-26 NOTE — Patient Instructions (Signed)
-   Drink one bottle of Ensure by mouth daily for weight loss.

## 2021-08-26 NOTE — Progress Notes (Signed)
Provider: Marlowe Sax FNP-C   Ronny Korff, Nelda Bucks, NP  Patient Care Team: English Tomer, Nelda Bucks, NP as PCP - General (Family Medicine) Clent Jacks, MD as Consulting Physician (Ophthalmology) Sydnee Levans, MD as Referring Physician (Dermatology)  Extended Emergency Contact Information Primary Emergency Contact: Wild-Johnson,Lori Address: [address]          [place] Phone: [phone] Mobile Phone: [phone] Relation: None  Code Status:  Full Code  Goals of care: Advanced Directive information Advanced Directives 05/24/2021  Does Patient Have a Medical Advance Directive? No  Type of Advance Directive -  Does patient want to make changes to medical advance directive? -  Copy of [place] in Chart? -  Would patient like information on creating a medical advance directive? No - Patient declined     Chief Complaint  Patient presents with   Medical Management of Chronic Issues    4 month follow up.   Health Maintenance    Discuss the need for eye exam, and foot exam   Immunizations    Discuss the need for shingrix vaccine.    HPI:  Pt is a 87 y.o. female seen today for 4 months follow up for medical management of chronic diseases.she has a medical history of Type 2 DM,Essential Hypertension,Osteoarthritis,Diabetic peripheral Neuropathy,right bundle branch block,Atrophic vaginitis,Hyperlipidemia,CKD stage 3,anorexia,,urinary incontinence,Anemia,chronic venous insufficiency,chronic pain syndrome,bilateral lower extremities edema among others. She denies any acute issues today  Has had a weight loss 6 lbs over 4 months.reports appetite as good.previously had leg edema which she states has improved.wears thigh high compression stockings.  No home CBG for review.latest lab work reviewed and discussed today.Glucose was 122 and Hgb A 1 C 6.2.follows up with Ophthalmology for eye examination.she was also seen by  Podiatrist Dr.Galaway 07/26/2021 for diabetic foot exam.toenails trimmed.Also has corns and callosities and Diabetic peripheral Neuropathy.she was advised to follow up in 3 months.  Immunization reviewed up to date except due for shingrix vaccine.Discussed with her today to get shingrix vaccine at her pharmacy.    Past Medical History:  Diagnosis Date   Anemia, unspecified    Anorexia    Chronic kidney disease    Chronic kidney disease, stage III (moderate) (HCC)    Cystocele, midline    Disorder of bone and cartilage, unspecified    Kyphosis associated with other condition    Obesity, unspecified    Osteoarthrosis, unspecified whether generalized or localized, unspecified site    Other and unspecified hyperlipidemia    Other hammer toe (acquired)    Postmenopausal atrophic vaginitis    PVC's (premature ventricular contractions) 01/29/2015   RBBB 01/29/2015   Reflux esophagitis    Type II or unspecified type diabetes mellitus without mention of complication, not stated as uncontrolled    Unspecified constipation    Unspecified disorder of kidney and ureter    Unspecified essential hypertension    Unspecified hereditary and idiopathic peripheral neuropathy    Unspecified urinary incontinence    Past Surgical History:  Procedure Laterality Date   BLADDER SURGERY     tie up in 2013   EYE SURGERY  11/2016   Bilateral Cataract Sx   TUBAL LIGATION      Allergies  Allergen Reactions   Pravastatin Sodium Other (See Comments)    Leg Myalgias    Keflex [Cephalexin]    Metronidazole    Simvastatin     Leg cramsps    Allergies as of 08/26/2021  Reactions   Pravastatin Sodium Other (See Comments)   Leg Myalgias    Keflex [cephalexin]    Metronidazole    Simvastatin    Leg cramsps        Medication List        Accurate as of August 26, 2021  3:50 PM. If you have any questions, ask your nurse or doctor.          aspirin 81 MG tablet Take 81 mg by mouth  daily.   calcium-vitamin D 500-200 MG-UNIT tablet Commonly known as: OSCAL WITH D Take 1 tablet by mouth daily.   cholecalciferol 1000 units tablet Commonly known as: VITAMIN D Take 1,000 Units by mouth daily.   gabapentin 300 MG capsule Commonly known as: NEURONTIN Take one capsule in the morning and take One capsule in the evening for pains.   oxyCODONE-acetaminophen 5-325 MG tablet Commonly known as: PERCOCET/ROXICET TAKE ONE TABLET BY MOUTH EVERY 8 HOURS AS NEEDED FOR SEVERE PAIN   RELION BLOOD GLUCOSE TEST VI 1 strip by In Vitro route daily.   triamcinolone cream 0.1 % Commonly known as: KENALOG Apply 1 application topically 2 (two) times daily.   vitamin E 180 MG (400 UNITS) capsule Take 400 Units by mouth daily.        Review of Systems  Constitutional:  Negative for appetite change, chills, fatigue, fever and unexpected weight change.  HENT:  Negative for congestion, dental problem, ear discharge, ear pain, facial swelling, hearing loss, nosebleeds, postnasal drip, rhinorrhea, sinus pressure, sinus pain, sneezing, sore throat, tinnitus and trouble swallowing.   Eyes:  Positive for visual disturbance. Negative for pain, discharge, redness and itching.       Has cataract follows up with Dr.Groat.wear eye glass  Respiratory:  Negative for cough, chest tightness, shortness of breath and wheezing.   Cardiovascular:  Negative for chest pain, palpitations and leg swelling.  Gastrointestinal:  Negative for abdominal distention, abdominal pain, blood in stool, constipation, diarrhea, nausea and vomiting.  Endocrine: Negative for cold intolerance, heat intolerance, polydipsia, polyphagia and polyuria.  Genitourinary:  Negative for difficulty urinating, dysuria, flank pain, frequency and urgency.  Musculoskeletal:  Positive for arthralgias, back pain and gait problem. Negative for joint swelling, myalgias, neck pain and neck stiffness.  Skin:  Negative for color change,  pallor, rash and wound.  Neurological:  Negative for dizziness, syncope, speech difficulty, weakness, light-headedness, numbness and headaches.  Hematological:  Does not bruise/bleed easily.  Psychiatric/Behavioral:  Negative for agitation, behavioral problems, confusion, hallucinations, self-injury, sleep disturbance and suicidal ideas. The patient is not nervous/anxious.        Forgets a whole lot of things    Immunization History  Administered Date(s) Administered   Fluad Quad(high Dose 65+) 08/08/2019, 10/29/2020, 08/26/2021   Influenza, High Dose Seasonal PF 09/11/2018   Influenza,inj,Quad PF,6+ Mos 10/08/2013, 09/30/2014   Influenza-Unspecified 10/17/2012, 09/12/2015, 10/01/2016, 09/23/2017   PFIZER(Purple Top)SARS-COV-2 Vaccination 01/07/2020, 01/22/2020, 09/09/2020, 03/25/2021   Pneumococcal Conjugate-13 10/31/1997, 07/29/2014   Pneumococcal Polysaccharide-23 07/30/2015   Tdap 10/31/1997, 03/12/2014, 10/01/2016   Zoster, Live 07/30/2014   Pertinent  Health Maintenance Due  Topic Date Due   OPHTHALMOLOGY EXAM  12/03/2020   FOOT EXAM  03/09/2021   URINE MICROALBUMIN  10/30/2021   HEMOGLOBIN A1C  11-26-2021   INFLUENZA VACCINE  Completed   DEXA SCAN  Completed   PNA vac Low Risk Adult  Completed   Fall Risk  08/26/2021 05/24/2021 04/20/2021 12/10/2020 11/03/2020  Falls in the past year? 0 0  0 1 0  Comment - - - fell out of a chair -  Number falls in past yr: 0 0 0 0 0  Injury with Fall? 0 0 0 0 0  Comment - - - - -  Risk for fall due to : No Fall Risks - - - -  Follow up Falls evaluation completed - - - -   Functional Status Survey:    Vitals:   08/26/21 1528  BP: 110/70  Pulse: 70  Resp: 16  Temp: (!) 97.1 F (36.2 C)  SpO2: 98%  Weight: 130 lb 3.2 oz (59.1 kg)  Height: 5' 5"  (1.651 m)   Body mass index is 21.67 kg/m. Physical Exam Vitals reviewed.  Constitutional:      General: She is not in acute distress.    Appearance: Normal appearance. She is normal  weight. She is not ill-appearing or diaphoretic.  HENT:     Head: Normocephalic.     Right Ear: Tympanic membrane, ear canal and external ear normal. There is no impacted cerumen.     Left Ear: Tympanic membrane, ear canal and external ear normal. There is no impacted cerumen.     Nose: Nose normal. No congestion or rhinorrhea.     Mouth/Throat:     Mouth: Mucous membranes are moist.     Pharynx: Oropharynx is clear. No oropharyngeal exudate or posterior oropharyngeal erythema.  Eyes:     General: No scleral icterus.       Right eye: No discharge.        Left eye: No discharge.     Extraocular Movements: Extraocular movements intact.     Conjunctiva/sclera: Conjunctivae normal.     Pupils: Pupils are equal, round, and reactive to light.  Neck:     Vascular: No carotid bruit.  Cardiovascular:     Rate and Rhythm: Normal rate and regular rhythm.     Pulses:          Posterior tibial pulses are 1+ on the right side and 1+ on the left side.     Heart sounds: Normal heart sounds. No murmur heard.   No friction rub. No gallop.  Pulmonary:     Effort: Pulmonary effort is normal. No respiratory distress.     Breath sounds: Normal breath sounds. No wheezing, rhonchi or rales.  Chest:     Chest wall: No tenderness.  Abdominal:     General: Bowel sounds are normal. There is no distension.     Palpations: Abdomen is soft. There is no mass.     Tenderness: There is no abdominal tenderness. There is no right CVA tenderness, left CVA tenderness, guarding or rebound.  Musculoskeletal:        General: No swelling or tenderness. Normal range of motion.     Cervical back: Normal range of motion. No rigidity or tenderness.     Right lower leg: 1+ Edema present.     Left lower leg: 1+ Edema present.     Right foot: Bunion present.     Left foot: Bunion present.     Comments: Bilateral hammer toes   Lymphadenopathy:     Cervical: No cervical adenopathy.  Skin:    General: Skin is warm and dry.      Coloration: Skin is not pale.     Findings: No bruising, erythema, lesion or rash.  Neurological:     Mental Status: She is alert and oriented to person, place, and time.  Cranial Nerves: No cranial nerve deficit.     Sensory: Sensory deficit present.     Motor: No weakness.     Coordination: Coordination normal.     Gait: Gait normal.  Psychiatric:        Mood and Affect: Mood normal.        Speech: Speech normal.        Behavior: Behavior normal.        Thought Content: Thought content normal.        Judgment: Judgment normal.    Labs reviewed: Recent Labs    12/07/20 0901 05/21/21 0858  NA 146 145  K 3.8 4.1  CL 109 107  CO2 28 28  GLUCOSE 139* 122*  BUN 28* 36*  CREATININE 1.42* 1.46*  CALCIUM 9.4 9.9   No results for input(s): AST, ALT, ALKPHOS, BILITOT, PROT, ALBUMIN in the last 8760 hours. Recent Labs    12/07/20 0901 05/21/21 0858  WBC 8.2 7.3  NEUTROABS 4,764 3,767  HGB 10.6* 10.8*  HCT 34.0* 34.5*  MCV 87.9 88.0  PLT 158 139*   Lab Results  Component Value Date   TSH 2.57 03/19/2020   Lab Results  Component Value Date   HGBA1C 6.2 (H) 05/21/2021   Lab Results  Component Value Date   CHOL 137 03/19/2020   HDL 41 (L) 03/19/2020   LDLCALC 73 03/19/2020   TRIG 146 03/19/2020   CHOLHDL 3.3 03/19/2020    Significant Diagnostic Results in last 30 days:  No results found.  Assessment/Plan  1. Need for influenza vaccination Afebrile Flut shot administered by CMA no acute reaction reported.  - Flu Vaccine QUAD High Dose(Fluad)  2. Type 2 diabetes mellitus with stage 4 chronic kidney disease, without long-term current use of insulin (HCC) Lab Results  Component Value Date   HGBA1C 6.2 (H) 05/21/2021   No home CBG readings for review.Recent Glucose normal range.  - CBC with Differential/Platelet; Future - CMP with eGFR([facility]); Future - TSH; Future - Hemoglobin A1c; Future  3. Anemia, unspecified type Hgb stable  - CBC with  Differential/Platelet; Future  4. Weight loss Has had 6 lbs weight loss over 4 months. Advised to drink Ensure one bottle by mouth once daily between meals.  Continue to monitor weight  Will rule out other metabolic etiologies  - TSH; Future  5. Spinal stenosis, lumbar region, with neurogenic claudication Chronic pain continues to require Percocet for pain control.PDMP reviewed.Narcotic use contract renewed. - Urine Drug Screen w/Alc, no confirm([facility])  6. Bilateral lower extremity edema Has improved. Continue to wear thigh high compression stockings    7. Mixed hyperlipidemia LDL at goal  - Lipid panel; Future  8. Chronic pain syndrome Continue on current pain regimen.  - Urine Drug Screen w/Alc, no confirm([facility])  9. Chronic venous insufficiency No ulceration noted.diminished pedal pulse.Continue thigh high compression stockings.  Family/ staff Communication: Reviewed plan of care with patient verbalized understanding  Labs/tests ordered:  - CBC with Differential/Platelet - CMP with eGFR([facility]) - TSH - Hgb A1C - Lipid panel - Urine Drug Screen w/Alc, no confirm([facility])  Next Appointment : 6 months for medical management of chronic issues.Fasting Labs prior to visit.    Sandrea Hughs, NP

## 2021-08-30 LAB — DM TEMPLATE

## 2021-08-30 LAB — DRUG MONITOR, PANEL 1, W/CONF, URINE
Amphetamines: NEGATIVE ng/mL (ref <500)
Barbiturates: NEGATIVE ng/mL (ref <300)
Benzodiazepines: NEGATIVE ng/mL (ref <100)
Cocaine Metabolite: NEGATIVE ng/mL (ref <150)
Codeine: NEGATIVE ng/mL (ref <50)
Creatinine: 144.1 mg/dL (ref >=20.0)
Hydrocodone: NEGATIVE ng/mL (ref <50)
Hydromorphone: NEGATIVE ng/mL (ref <50)
Marijuana Metabolite: NEGATIVE ng/mL (ref <20)
Methadone Metabolite: NEGATIVE ng/mL (ref <100)
Morphine: NEGATIVE ng/mL (ref <50)
Norhydrocodone: NEGATIVE ng/mL (ref <50)
Noroxycodone: 1766 ng/mL — ABNORMAL HIGH (ref <50)
Opiates: NEGATIVE ng/mL (ref <100)
Oxidant: NEGATIVE ug/mL (ref <200)
Oxycodone: 917 ng/mL — ABNORMAL HIGH (ref <50)
Oxycodone: POSITIVE ng/mL — AB (ref <100)
Oxymorphone: 1020 ng/mL — ABNORMAL HIGH (ref <50)
Phencyclidine: NEGATIVE ng/mL (ref <25)
pH: 6.4 (ref 4.5–9.0)

## 2021-09-05 DIAGNOSIS — I872 Venous insufficiency (chronic) (peripheral): Secondary | ICD-10-CM | POA: Insufficient documentation

## 2021-09-05 NOTE — Progress Notes (Signed)
This encounter was created in error - please disregard.

## 2021-09-06 ENCOUNTER — Other Ambulatory Visit: Payer: Self-pay | Admitting: *Deleted

## 2021-09-06 MED ORDER — TRIAMCINOLONE ACETONIDE 0.1 % EX CREA: 1.0000 "application " | TOPICAL_CREAM | Freq: Two times a day (BID) | CUTANEOUS | 1 refills | Status: AC

## 2021-09-06 NOTE — Telephone Encounter (Signed)
[facility]

## 2021-10-06 ENCOUNTER — Telehealth: Payer: Self-pay

## 2021-10-06 DIAGNOSIS — M48062 Spinal stenosis, lumbar region with neurogenic claudication: Secondary | ICD-10-CM

## 2021-10-06 NOTE — Telephone Encounter (Signed)
Patient called wanting refill on Oxycodone.  Last filled 08/25/21. Contract is up to date. Medication pended and sent to Marlowe Sax, NP for approval.

## 2021-10-08 ENCOUNTER — Other Ambulatory Visit: Payer: Self-pay | Admitting: Family

## 2021-10-08 DIAGNOSIS — M48062 Spinal stenosis, lumbar region with neurogenic claudication: Secondary | ICD-10-CM

## 2021-10-08 NOTE — Telephone Encounter (Signed)
Patient has request refill on medication "Oxycodone". Patient last refill was 08/25/2021. Patient has Opioid contract on file from 09/01/2021. Patient medication pend and sent to PCP [name], Nelda Bucks, NP for approval.

## 2021-10-13 NOTE — Telephone Encounter (Signed)
Patient called again requesting her Refill for Oxycodone.  Rx was Pended on 10/06/2021 and filled on 10/08/2021  Called and informed patient that there is confirmation Rx was sent to pharmacy on 10/08/2021     oxyCODONE-acetaminophen (PERCOCET/ROXICET) 5-325 MG tablet 90 tablet 0 10/08/2021    Sig: TAKE ONE TABLET BY MOUTH EVERY 8 HOURS AS NEEDED FOR SEVERE PAIN   Sent to pharmacy as: oxyCODONE-acetaminophen (PERCOCET/ROXICET) 5-325 MG tablet   Earliest Fill Date: 10/08/2021   E-Prescribing Status: Receipt confirmed by pharmacy (10/08/2021  4:51 PM EDT)

## 2021-11-01 ENCOUNTER — Ambulatory Visit (INDEPENDENT_AMBULATORY_CARE_PROVIDER_SITE_OTHER): Payer: Medicare Other | Admitting: Podiatry

## 2021-11-01 ENCOUNTER — Encounter: Payer: Self-pay | Admitting: Podiatry

## 2021-11-01 ENCOUNTER — Other Ambulatory Visit: Payer: Self-pay

## 2021-11-01 DIAGNOSIS — M2042 Other hammer toe(s) (acquired), left foot: Secondary | ICD-10-CM

## 2021-11-01 DIAGNOSIS — M2041 Other hammer toe(s) (acquired), right foot: Secondary | ICD-10-CM

## 2021-11-01 DIAGNOSIS — E1142 Type 2 diabetes mellitus with diabetic polyneuropathy: Secondary | ICD-10-CM | POA: Diagnosis not present

## 2021-11-01 DIAGNOSIS — B351 Tinea unguium: Secondary | ICD-10-CM | POA: Diagnosis not present

## 2021-11-01 DIAGNOSIS — L84 Corns and callosities: Secondary | ICD-10-CM

## 2021-11-01 DIAGNOSIS — M79609 Pain in unspecified limb: Secondary | ICD-10-CM

## 2021-11-01 DIAGNOSIS — E119 Type 2 diabetes mellitus without complications: Secondary | ICD-10-CM

## 2021-11-01 NOTE — Progress Notes (Signed)
ANNUAL DIABETIC FOOT EXAM  Subjective: Dawn [name] presents today for for annual diabetic foot examination, at risk foot care with history of diabetic neuropathy, and corn(s) right foot , callus(es) of both feet and painful mycotic nails.  Pain interferes with ambulation. Aggravating factors include wearing enclosed shoe gear. Painful toenails interfere with ambulation. Aggravating factors include wearing enclosed shoe gear. Pain is relieved with periodic professional debridement. Painful corns and calluses are aggravated when weightbearing with and without shoegear. Pain is relieved with periodic professional debridement..  Patient relates 69 year h/o diabetes.  Patient denies any h/o foot wounds.  Patient has been diagnosed with neuropathy and it is managed with gabapentin.  Patient's blood sugar was 153 mg/dl on yesterday.   Dawn [name], Dawn Bucks, NP is patient's PCP. Last visit was 08/26/2021.  She is inquiring about the status of her diabetic shoes.  Past Medical History:  Diagnosis Date   Anemia, unspecified    Anorexia    Chronic kidney disease    Chronic kidney disease, stage III (moderate) (HCC)    Cystocele, midline    Disorder of bone and cartilage, unspecified    Kyphosis associated with other condition    Obesity, unspecified    Osteoarthrosis, unspecified whether generalized or localized, unspecified site    Other and unspecified hyperlipidemia    Other hammer toe (acquired)    Postmenopausal atrophic vaginitis    PVC's (premature ventricular contractions) 01/29/2015   RBBB 01/29/2015   Reflux esophagitis    Type II or unspecified type diabetes mellitus without mention of complication, not stated as uncontrolled    Unspecified constipation    Unspecified disorder of kidney and ureter    Unspecified essential hypertension    Unspecified hereditary and idiopathic peripheral neuropathy    Unspecified urinary incontinence    Patient Active Problem List   Diagnosis Date  Noted   Chronic venous insufficiency 09/05/2021   Colitis 11/26/2018   Advance care planning 11/20/2017   PVC's (premature ventricular contractions) 01/29/2015   RBBB 01/29/2015   Mixed hyperlipidemia 01/06/2015   Osteopenia 01/06/2015   Benign essential HTN 07/29/2014   Weight gain 07/29/2014   Therapeutic opioid induced constipation 07/29/2014   Spinal stenosis, lumbar region, with neurogenic claudication 04/30/2014   Neuropathic pain 03/05/2014   Bilateral leg pain 02/05/2014   DM type 2 with diabetic peripheral neuropathy ([place]) 02/05/2014   Unspecified constipation 01/08/2014   Leg pain 01/08/2014   Routine general medical examination at a health care facility 01/08/2014   Type 2 diabetes mellitus with stage 4 chronic kidney disease, without long-term current use of insulin ([place]) 10/08/2013   Hypertensive renal disease 10/08/2013   Need for prophylactic vaccination and inoculation against influenza 10/08/2013   Generalized OA 07/23/2013   UTI (urinary tract infection) 06/19/2013   Hypoglycemia 06/19/2013   Diabetes mellitus with nephropathy ([place]) 03/21/2013   Osteoarthritis    Other and unspecified hyperlipidemia    Chronic kidney disease    Past Surgical History:  Procedure Laterality Date   BLADDER SURGERY     tie up in 2013   EYE SURGERY  11/2016   Bilateral Cataract Sx   TUBAL LIGATION     Current Outpatient Medications on File Prior to Visit  Medication Sig Dispense Refill   aspirin 81 MG tablet Take 81 mg by mouth daily.     calcium-vitamin D (OSCAL WITH D) 500-200 MG-UNIT per tablet Take 1 tablet by mouth daily.     cholecalciferol (VITAMIN D) 1000 UNITS tablet Take  1,000 Units by mouth daily.     gabapentin (NEURONTIN) 300 MG capsule Take one capsule in the morning and take One capsule in the evening for pains. 180 capsule 3   Glucose Blood (RELION BLOOD GLUCOSE TEST VI) 1 strip by In Vitro route daily.     oxyCODONE-acetaminophen (PERCOCET/ROXICET) 5-325 MG  tablet TAKE ONE TABLET BY MOUTH EVERY 8 HOURS AS NEEDED FOR SEVERE PAIN 90 tablet 0   triamcinolone cream (KENALOG) 0.1 % Apply 1 application topically 2 (two) times daily. 80 g 1   vitamin E 400 UNIT capsule Take 400 Units by mouth daily.     No current facility-administered medications on file prior to visit.    Allergies  Allergen Reactions   Pravastatin Sodium Other (See Comments)    Leg Myalgias    Keflex [Cephalexin]    Metronidazole    Simvastatin     Leg cramsps   Social History   Occupational History   Occupation: retired Marine scientist -Market researcher  Tobacco Use   Smoking status: Former    Types: Cigarettes    Quit date: 12/12/1968    Years since quitting: 52.9   Smokeless tobacco: Never   Tobacco comments:    quit in [place] Use   Vaping Use: Never used  Substance and Sexual Activity   Alcohol use: No    Alcohol/week: 0.0 standard drinks   Drug use: No   Sexual activity: Never   Family History  Problem Relation Age of Onset   Heart disease Mother    Heart disease Sister    Cancer Son    Cancer Son    Stomach cancer Neg Hx    Colon cancer Neg Hx    Pancreatic cancer Neg Hx    Immunization History  Administered Date(s) Administered   Fluad Quad(high Dose 65+) 08/08/2019, 10/29/2020, 08/26/2021   Influenza, High Dose Seasonal PF 09/11/2018   Influenza,inj,Quad PF,6+ Mos 10/08/2013, 09/30/2014   Influenza-Unspecified 10/17/2012, 09/12/2015, 10/01/2016, 09/23/2017   PFIZER(Purple Top)SARS-COV-2 Vaccination 01/07/2020, 01/22/2020, 09/09/2020, 03/25/2021   Pneumococcal Conjugate-13 10/31/1997, 07/29/2014   Pneumococcal Polysaccharide-23 07/30/2015   Tdap 10/31/1997, 03/12/2014, 10/01/2016   Zoster, Live 07/30/2014     Review of Systems: Negative except as noted in the HPI.   Objective: There were no vitals filed for this visit.  Dawn [name] is a pleasant 87 y.o. female in NAD. AAO X 3.  Vascular Examination: CFT <3 seconds b/l LE. Faintly palpable  pedal pulses b/l LE. Pedal hair absent b/l LE. Skin temperature gradient WNL b/l. No pain with calf compression b/l. No edema b/l LE. No cyanosis or clubbing noted b/l LE. Evidence of chronic venous insufficiency b/l LE.  Dermatological Examination: Pedal integument with normal turgor, texture and tone BLE. No open wounds b/l LE. No interdigital macerations noted b/l LE. Toenails 1-5 b/l elongated, discolored, dystrophic, thickened, crumbly with subungual debris and tenderness to dorsal palpation. Hyperkeratotic lesion(s) R 5th toe and submet head 5 b/l.  No erythema, no edema, no drainage, no fluctuance. Skin changes consistent with venous stasis noted b/l lower extremities.  Musculoskeletal Examination: Muscle strength 5/5 to all lower extremity muscle groups bilaterally. HAV with bunion deformity noted b/l LE. Hammertoe deformity noted 2-5 b/l. Pes planus deformity noted bilateral LE.  Footwear Assessment: Does the patient wear appropriate shoes? Yes. Does the patient need inserts/orthotics? Yes.  Neurological Examination: Protective sensation diminished with 10g monofilament b/l. Vibratory sensation diminished b/l.  Hemoglobin A1C Latest Ref Rng & Units 05/21/2021 12/07/2020  HGBA1C <5.7 % of total Hgb 6.2(H) 5.6  Some recent data might be hidden   Assessment: 1. Pain due to onychomycosis of nail   2. Corns and callosities   3. Acquired hammertoes of both feet   4. Diabetic peripheral neuropathy associated with type 2 diabetes mellitus ([place])   5. Encounter for diabetic foot exam ([place])     ADA Risk Categorization: High Risk  Patient has one or more of the following: Loss of protective sensation Absent pedal pulses Severe Foot deformity History of foot ulcer  Plan: -New diabetic shoes pending MD/DO signature for diabetic shoe certification form. She was given a copy and will take to her PCP's office for signature. Shoes will be ordered after we receive the form. She related  understanding. -Diabetic foot examination performed today. -Continue foot and shoe inspections daily. Monitor blood glucose per PCP/Endocrinologist's recommendations. -Patient to continue soft, supportive shoe gear daily. -Toenails 1-5 bilaterally were debrided in length and girth with sterile nail nippers and dremel. Pinpoint bleeding of L 4th toe addressed with Lumicain Hemostatic Solution, cleansed with alcohol. triple antibiotic ointment applied. Patient instructed to apply Neosporin Cream once daily for 7 days. -Corn(s) R 5th toe and callus(es) submet head 5 b/l were pared utilizing sterile scalpel blade without incident. Total number debrided =3. -Patient/POA to call should there be question/concern in the interim.  Return in about 3 months (around 02/01/2022).  Marzetta Board, DPM

## 2021-11-16 ENCOUNTER — Telehealth: Payer: Self-pay | Admitting: Podiatry

## 2021-11-16 NOTE — Telephone Encounter (Signed)
Lvm for pt to call to discuss diabetic shoes. The current one on order is not available until April 2023 and I wanted to see if pt wanted to wait until April or if she would like to pick a different shoe.

## 2021-11-19 ENCOUNTER — Emergency Department (HOSPITAL_COMMUNITY)
Admission: EM | Admit: 2021-11-19 | Discharge: 2021-12-12 | Disposition: E | Payer: Medicare Other | Attending: Emergency Medicine | Admitting: Emergency Medicine

## 2021-11-19 ENCOUNTER — Emergency Department (HOSPITAL_COMMUNITY): Payer: Medicare Other

## 2021-11-19 DIAGNOSIS — Z87891 Personal history of nicotine dependence: Secondary | ICD-10-CM | POA: Diagnosis not present

## 2021-11-19 DIAGNOSIS — I469 Cardiac arrest, cause unspecified: Secondary | ICD-10-CM | POA: Insufficient documentation

## 2021-11-19 DIAGNOSIS — R404 Transient alteration of awareness: Secondary | ICD-10-CM | POA: Diagnosis not present

## 2021-11-19 DIAGNOSIS — I499 Cardiac arrhythmia, unspecified: Secondary | ICD-10-CM | POA: Diagnosis not present

## 2021-11-19 DIAGNOSIS — E1142 Type 2 diabetes mellitus with diabetic polyneuropathy: Secondary | ICD-10-CM | POA: Diagnosis not present

## 2021-11-19 DIAGNOSIS — R6889 Other general symptoms and signs: Secondary | ICD-10-CM | POA: Diagnosis not present

## 2021-11-19 DIAGNOSIS — Z4682 Encounter for fitting and adjustment of non-vascular catheter: Secondary | ICD-10-CM | POA: Diagnosis not present

## 2021-11-19 DIAGNOSIS — Z743 Need for continuous supervision: Secondary | ICD-10-CM | POA: Diagnosis not present

## 2021-11-19 DIAGNOSIS — I129 Hypertensive chronic kidney disease with stage 1 through stage 4 chronic kidney disease, or unspecified chronic kidney disease: Secondary | ICD-10-CM | POA: Insufficient documentation

## 2021-11-19 DIAGNOSIS — N184 Chronic kidney disease, stage 4 (severe): Secondary | ICD-10-CM | POA: Insufficient documentation

## 2021-11-19 DIAGNOSIS — Z7982 Long term (current) use of aspirin: Secondary | ICD-10-CM | POA: Diagnosis not present

## 2021-11-19 DIAGNOSIS — E1122 Type 2 diabetes mellitus with diabetic chronic kidney disease: Secondary | ICD-10-CM | POA: Insufficient documentation

## 2021-11-19 LAB — CBC WITH DIFFERENTIAL/PLATELET
Abs Immature Granulocytes: 0.3 K/uL — ABNORMAL HIGH (ref 0.00–0.07)
Band Neutrophils: 2 %
Basophils Absolute: 0 K/uL (ref 0.0–0.1)
Basophils Relative: 0 %
Eosinophils Absolute: 0.1 K/uL (ref 0.0–0.5)
Eosinophils Relative: 1 %
HCT: 36.8 % (ref 36.0–46.0)
Hemoglobin: 10.6 g/dL — ABNORMAL LOW (ref 12.0–15.0)
Lymphocytes Relative: 55 %
Lymphs Abs: 7.4 K/uL — ABNORMAL HIGH (ref 0.7–4.0)
MCH: 27.8 pg (ref 26.0–34.0)
MCHC: 28.8 g/dL — ABNORMAL LOW (ref 30.0–36.0)
MCV: 96.6 fL (ref 80.0–100.0)
Metamyelocytes Relative: 2 %
Monocytes Absolute: 0.4 K/uL (ref 0.1–1.0)
Monocytes Relative: 3 %
Neutro Abs: 5.2 K/uL (ref 1.7–7.7)
Neutrophils Relative %: 37 %
Platelets: 89 K/uL — ABNORMAL LOW (ref 150–400)
RBC: 3.81 MIL/uL — ABNORMAL LOW (ref 3.87–5.11)
RDW: 15 % (ref 11.5–15.5)
WBC: 13.4 K/uL — ABNORMAL HIGH (ref 4.0–10.5)
nRBC: 0.3 % — ABNORMAL HIGH (ref 0.0–0.2)
nRBC: 1 /100{WBCs} — ABNORMAL HIGH

## 2021-11-19 LAB — I-STAT CHEM 8, ED
BUN: 41 mg/dL — ABNORMAL HIGH (ref 8–23)
Calcium, Ion: 1.03 mmol/L — ABNORMAL LOW (ref 1.15–1.40)
Chloride: 113 mmol/L — ABNORMAL HIGH (ref 98–111)
Creatinine, Ser: 2 mg/dL — ABNORMAL HIGH (ref 0.44–1.00)
Glucose, Bld: 167 mg/dL — ABNORMAL HIGH (ref 70–99)
HCT: 32 % — ABNORMAL LOW (ref 36.0–46.0)
Hemoglobin: 10.9 g/dL — ABNORMAL LOW (ref 12.0–15.0)
Potassium: 4.8 mmol/L (ref 3.5–5.1)
Sodium: 138 mmol/L (ref 135–145)
TCO2: 11 mmol/L — ABNORMAL LOW (ref 22–32)

## 2021-11-19 LAB — COMPREHENSIVE METABOLIC PANEL
ALT: 5054 U/L — ABNORMAL HIGH (ref 0–44)
AST: 5857 U/L — ABNORMAL HIGH (ref 15–41)
Albumin: 2.1 g/dL — ABNORMAL LOW (ref 3.5–5.0)
Alkaline Phosphatase: 241 U/L — ABNORMAL HIGH (ref 38–126)
Anion gap: 22 — ABNORMAL HIGH (ref 5–15)
BUN: 29 mg/dL — ABNORMAL HIGH (ref 8–23)
CO2: 9 mmol/L — ABNORMAL LOW (ref 22–32)
Calcium: 8.5 mg/dL — ABNORMAL LOW (ref 8.9–10.3)
Chloride: 107 mmol/L (ref 98–111)
Creatinine, Ser: 2.37 mg/dL — ABNORMAL HIGH (ref 0.44–1.00)
GFR, Estimated: 19 mL/min — ABNORMAL LOW (ref >60)
Glucose, Bld: 223 mg/dL — ABNORMAL HIGH (ref 70–99)
Potassium: 5.5 mmol/L — ABNORMAL HIGH (ref 3.5–5.1)
Sodium: 138 mmol/L (ref 135–145)
Total Bilirubin: 1.2 mg/dL (ref 0.3–1.2)
Total Protein: 5.4 g/dL — ABNORMAL LOW (ref 6.5–8.1)

## 2021-11-19 LAB — I-STAT VENOUS BLOOD GAS, ED
Acid-base deficit: 24 mmol/L — ABNORMAL HIGH (ref 0.0–2.0)
Bicarbonate: 9.9 mmol/L — ABNORMAL LOW (ref 20.0–28.0)
Calcium, Ion: 1.01 mmol/L — ABNORMAL LOW (ref 1.15–1.40)
HCT: 28 % — ABNORMAL LOW (ref 36.0–46.0)
Hemoglobin: 9.5 g/dL — ABNORMAL LOW (ref 12.0–15.0)
O2 Saturation: 42 %
Potassium: 5.2 mmol/L — ABNORMAL HIGH (ref 3.5–5.1)
Sodium: 142 mmol/L (ref 135–145)
TCO2: 12 mmol/L — ABNORMAL LOW (ref 22–32)
pCO2, Ven: 65.3 mmHg — ABNORMAL HIGH (ref 44.0–60.0)
pH, Ven: 6.79 — CL (ref 7.250–7.430)
pO2, Ven: 44 mmHg (ref 32.0–45.0)

## 2021-11-19 LAB — LACTIC ACID, PLASMA
Lactic Acid, Venous: >9 mmol/L (ref 0.5–1.9)
Lactic Acid, Venous: >9 mmol/L (ref 0.5–1.9)

## 2021-11-19 LAB — TROPONIN I (HIGH SENSITIVITY): Troponin I (High Sensitivity): 179 ng/L (ref <18)

## 2021-11-19 MED ORDER — NOREPINEPHRINE 4 MG/250ML-% IV SOLN: 0.0000 ug/min | INTRAVENOUS | Status: DC

## 2021-11-19 MED ORDER — EPINEPHRINE 1 MG/10ML IJ SOSY
PREFILLED_SYRINGE | INTRAMUSCULAR | Status: DC | PRN
  Administered 2021-11-19: .5 mg via INTRAVENOUS

## 2021-11-19 MED ORDER — SODIUM CHLORIDE 0.9 % IV BOLUS
500.0000 mL | Freq: Once | INTRAVENOUS | Status: AC
  Administered 2021-11-19: 500 mL via INTRAVENOUS

## 2021-11-19 MED ORDER — NOREPINEPHRINE 4 MG/250ML-% IV SOLN
INTRAVENOUS | Status: AC
  Administered 2021-11-19: 10 ug/min via INTRAVENOUS
  Filled 2021-11-19: qty 250

## 2021-11-19 NOTE — ED Notes (Signed)
Dr. Tamera Punt at bedside with this RN and pt's son, declared time of death.

## 2021-11-19 NOTE — ED Notes (Signed)
Dr. Tamera Punt made aware of BP 58/34 (42)

## 2021-11-19 NOTE — ED Triage Notes (Signed)
Pt bib EMS as Post-CPR. Found by family unresponsive after she requested something to eat. Found in PEA  by EMS upon arrival & CPR began. Given a total of 5 rounds epinephrine. Obtained ROSC just before arrival to [facility] ED. Intubated by EMS with 7.0 ETT in place & IO access to R knee  EMS: 146/90 Cap 20 HR 60-80 CBG 220, DM

## 2021-11-19 NOTE — ED Notes (Signed)
Pt's son currently in pt's room and speaking with Dr. Tamera Punt, [place].

## 2021-11-19 NOTE — ED Notes (Signed)
Critical care admitting providers at  bedside

## 2021-11-19 NOTE — Procedures (Signed)
Extubation Procedure Note  Patient Details:   Name: Rhena Glace DOB: Oct 25, 1928 MRN: [id number]   Airway Documentation:  Airway 7.5 mm (Active)  Secured at (cm) 25 cm 11/17/2021 2145  Measured From Lips 12/02/2021 2145  [place] 12/01/2021 2145  Secured By [employer] 11/14/2021 2145  Cuff Pressure (cm H2O) Clear OR 27-39 CmH2O 11/28/2021 2145   Vent end date: (not recorded) Vent end time: (not recorded)   Evaluation  O2 sats: currently acceptable Complications: No apparent complications Patient did not tolerate procedure well. Bilateral Breath Sounds: Diminished   No  Romeo Apple 11/18/2021, 10:46 PM

## 2021-11-19 NOTE — ED Provider Notes (Signed)
[facility] EMERGENCY DEPARTMENT Provider Note   CSN: [id number] Arrival date & time: 12/03/2021  2114     History No chief complaint on file.   Elody Tankard is a 87 y.o. female.  Patient is a 87 year old female who presents postcardiac arrest.  On chart review, she has a history of chronic kidney disease, diabetes and hypertension.  Per EMS, she was last seen by family about an hour prior to them finding her unresponsive.  Previously she requested something to eat.  She was found unresponsive and EMS was called.  On EMS arrival, she was in PEA at a rate of 50-60.  No palpable pulses were identified and CPR was begun.  She got a total of 4 rounds of epinephrine.  She initially obtained ROSC but then during transport seemed to of lost pulses again there had 1 more at the and 2 minutes of CPR.  She was intubated by EMS with a 7.0 ET tube.  History is limited due to her unresponsive state.      Past Medical History:  Diagnosis Date   Anemia, unspecified    Anorexia    Chronic kidney disease    Chronic kidney disease, stage III (moderate) (HCC)    Cystocele, midline    Disorder of bone and cartilage, unspecified    Kyphosis associated with other condition    Obesity, unspecified    Osteoarthrosis, unspecified whether generalized or localized, unspecified site    Other and unspecified hyperlipidemia    Other hammer toe (acquired)    Postmenopausal atrophic vaginitis    PVC's (premature ventricular contractions) 01/29/2015   RBBB 01/29/2015   Reflux esophagitis    Type II or unspecified type diabetes mellitus without mention of complication, not stated as uncontrolled    Unspecified constipation    Unspecified disorder of kidney and ureter    Unspecified essential hypertension    Unspecified hereditary and idiopathic peripheral neuropathy    Unspecified urinary incontinence     Patient Active Problem List   Diagnosis Date Noted   Chronic venous insufficiency  09/05/2021   Colitis 11/26/2018   Advance care planning 11/20/2017   PVC's (premature ventricular contractions) 01/29/2015   RBBB 01/29/2015   Mixed hyperlipidemia 01/06/2015   Osteopenia 01/06/2015   Benign essential HTN 07/29/2014   Weight gain 07/29/2014   Therapeutic opioid induced constipation 07/29/2014   Spinal stenosis, lumbar region, with neurogenic claudication 04/30/2014   Neuropathic pain 03/05/2014   Bilateral leg pain 02/05/2014   DM type 2 with diabetic peripheral neuropathy ([place]) 02/05/2014   Unspecified constipation 01/08/2014   Leg pain 01/08/2014   Routine general medical examination at a health care facility 01/08/2014   Type 2 diabetes mellitus with stage 4 chronic kidney disease, without long-term current use of insulin ([place]) 10/08/2013   Hypertensive renal disease 10/08/2013   Need for prophylactic vaccination and inoculation against influenza 10/08/2013   Generalized OA 07/23/2013   UTI (urinary tract infection) 06/19/2013   Hypoglycemia 06/19/2013   Diabetes mellitus with nephropathy ([place]) 03/21/2013   Osteoarthritis    Other and unspecified hyperlipidemia    Chronic kidney disease     Past Surgical History:  Procedure Laterality Date   BLADDER SURGERY     tie up in 2013   EYE SURGERY  11/2016   Bilateral Cataract Sx   TUBAL LIGATION       OB History   No obstetric history on file.     Family History  Problem Relation Age  of Onset   Heart disease Mother    Heart disease Sister    Cancer Son    Cancer Son    Stomach cancer Neg Hx    Colon cancer Neg Hx    Pancreatic cancer Neg Hx     Social History   Tobacco Use   Smoking status: Former    Types: Cigarettes    Quit date: 12/12/1968    Years since quitting: 52.9   Smokeless tobacco: Never   Tobacco comments:    quit in 1970  Vaping Use   Vaping Use: Never used  Substance Use Topics   Alcohol use: No    Alcohol/week: 0.0 standard drinks   Drug use: No    Home  Medications Prior to Admission medications   Medication Sig Start Date End Date Taking? Authorizing Provider  aspirin 81 MG tablet Take 81 mg by mouth daily.    [provider]  calcium-vitamin D (OSCAL WITH D) 500-200 MG-UNIT per tablet Take 1 tablet by mouth daily.    [provider]  cholecalciferol (VITAMIN D) 1000 UNITS tablet Take 1,000 Units by mouth daily.    [provider]  gabapentin (NEURONTIN) 300 MG capsule Take one capsule in the morning and take One capsule in the evening for pains. 05/19/21   [name], [name] C, NP  Glucose Blood (RELION BLOOD GLUCOSE TEST VI) 1 strip by In Vitro route daily.    [provider]  oxyCODONE-acetaminophen (PERCOCET/ROXICET) 5-325 MG tablet TAKE ONE TABLET BY MOUTH EVERY 8 HOURS AS NEEDED FOR SEVERE PAIN 10/08/21   [name], [name] C, NP  triamcinolone cream (KENALOG) 0.1 % Apply 1 application topically 2 (two) times daily. 09/06/21   [name], [name] C, NP  vitamin E 400 UNIT capsule Take 400 Units by mouth daily.    [provider]    Allergies    Pravastatin sodium, Keflex [cephalexin], Metronidazole, and Simvastatin  Review of Systems   Review of Systems  Unable to perform ROS: Intubated   Physical Exam Updated Vital Signs BP (!) 41/17   Pulse (!) 53   Temp (!) 92.2 F (33.4 C) (Rectal)   Resp (!) 24   Ht 5\' 9"  (1.753 m)   Wt 63.5 kg   SpO2 (!) 79%   BMI 20.67 kg/m   Physical Exam Constitutional:      Comments: Unresponsive  HENT:     Mouth/Throat:     Mouth: Mucous membranes are moist.  Eyes:     Comments: Pupils nonreactive  Cardiovascular:     Rate and Rhythm: Normal rate.  Pulmonary:     Comments: Patient being ventilated with a bag-valve-mask.  Breath sounds are present and appear to be symmetric. Abdominal:     General: Abdomen is flat.     Palpations: Abdomen is soft.  Musculoskeletal:        General: Normal range of motion.     Right lower leg: No edema.     Left lower  leg: No edema.  Skin:    Comments: Cool and dry to the touch  Neurological:     Comments: Unresponsive    ED Results / Procedures / Treatments   Labs (all labs ordered are listed, but only abnormal results are displayed) Labs Reviewed  COMPREHENSIVE METABOLIC PANEL - Abnormal; Notable for the following components:      Result Value   Potassium 5.5 (*)    CO2 9 (*)    Glucose, Bld 223 (*)  BUN 29 (*)    Creatinine, Ser 2.37 (*)    Calcium 8.5 (*)    Total Protein 5.4 (*)    Albumin 2.1 (*)    AST 5,857 (*)    ALT 5,054 (*)    Alkaline Phosphatase 241 (*)    GFR, Estimated 19 (*)    Anion gap 22 (*)    All other components within normal limits  CBC WITH DIFFERENTIAL/PLATELET - Abnormal; Notable for the following components:   WBC 13.4 (*)    RBC 3.81 (*)    Hemoglobin 10.6 (*)    MCHC 28.8 (*)    Platelets 89 (*)    nRBC 0.3 (*)    Lymphs Abs 7.4 (*)    nRBC 1 (*)    Abs Immature Granulocytes 0.30 (*)    All other components within normal limits  LACTIC ACID, PLASMA - Abnormal; Notable for the following components:   Lactic Acid, Venous >9.0 (*)    All other components within normal limits  LACTIC ACID, PLASMA - Abnormal; Notable for the following components:   Lactic Acid, Venous >9.0 (*)    All other components within normal limits  I-STAT CHEM 8, ED - Abnormal; Notable for the following components:   Chloride 113 (*)    BUN 41 (*)    Creatinine, Ser 2.00 (*)    Glucose, Bld 167 (*)    Calcium, Ion 1.03 (*)    TCO2 11 (*)    Hemoglobin 10.9 (*)    HCT 32.0 (*)    All other components within normal limits  I-STAT VENOUS BLOOD GAS, ED - Abnormal; Notable for the following components:   pH, Ven 6.790 (*)    pCO2, Ven 65.3 (*)    Bicarbonate 9.9 (*)    TCO2 12 (*)    Acid-base deficit 24.0 (*)    Potassium 5.2 (*)    Calcium, Ion 1.01 (*)    HCT 28.0 (*)    Hemoglobin 9.5 (*)    All other components within normal limits  TROPONIN I (HIGH SENSITIVITY) -  Abnormal; Notable for the following components:   Troponin I (High Sensitivity) 179 (*)    All other components within normal limits  RESP PANEL BY RT-PCR (FLU A&B, COVID) ARPGX2  TROPONIN I (HIGH SENSITIVITY)    EKG None  Radiology DG Chest Port 1 View  Result Date: 12/03/2021 CLINICAL DATA:  Post CPR. EXAM: PORTABLE CHEST 1 VIEW COMPARISON:  Chest x-ray 11/14/2018. FINDINGS: Endotracheal tube tip is 6 cm above the carina. Multiple lines overlie the chest. The lungs are clear. There is no pleural effusion or pneumothorax. The cardiomediastinal silhouette is within normal limits. No acute fractures are seen. IMPRESSION: 1. Endotracheal tube tip 6 cm above the carina. 2. The lungs are clear. Electronically Signed   By: Ronney Asters M.D.   On: 11/15/2021 21:37    Procedures Procedures   Medications Ordered in ED Medications  norepinephrine (LEVOPHED) 4mg  in 283mL (0.016 mg/mL) premix infusion (0 mcg/min Intravenous Stopped 11/28/2021 2230)  EPINEPHrine (ADRENALIN) 1 MG/10ML injection (0.5 mg Intravenous Given 11/16/2021 2145)  sodium chloride 0.9 % bolus 500 mL (0 mLs Intravenous Stopped 11/17/2021 2224)    ED Course  I have reviewed the triage vital signs and the nursing notes.  Pertinent labs & imaging results that were available during my care of the patient were reviewed by me and considered in my medical decision making (see chart for details).    MDM Rules/Calculators/A&P  Patient is a 87 year old female who presents postcardiac arrest.  She initially was hypotensive and Levophed was started.  She had some episodes of bradycardia and was given bolus doses of epi as well.  She was markedly acidotic and hypothermic.  She had no spontaneous movements.  I had a discussion with the patient's son.  She maxed out on Levophed with no improvement in her blood pressure.  At this point, my opinion and the opinion of the ICU physician at bedside is that further  interventions would not be beneficial to her survival.  I had a discussion with the patient's son and the patient's daughter regarding this and they were understanding.  She continued to bradycardia down and went into asystole.  No further resuscitation efforts were attempted.  Patient's PCP should be able to sign the death certificate.  CRITICAL CARE Performed by: Malvin Johns Total critical care time: 45 minutes Critical care time was exclusive of separately billable procedures and treating other patients. Critical care was necessary to treat or prevent imminent or life-threatening deterioration. Critical care was time spent personally by me on the following activities: development of treatment plan with patient and/or surrogate as well as nursing, discussions with consultants, evaluation of patient's response to treatment, examination of patient, obtaining history from patient or surrogate, ordering and performing treatments and interventions, ordering and review of laboratory studies, ordering and review of radiographic studies, pulse oximetry and re-evaluation of patient's condition.  Final Clinical Impression(s) / ED Diagnoses Final diagnoses:  Cardiac arrest [facility])    Rx / [place] Orders ED Discharge Orders     None        Malvin Johns, MD 11/29/2021 2306

## 2021-11-25 ENCOUNTER — Telehealth: Payer: Self-pay | Admitting: Podiatry

## 2021-11-25 NOTE — Telephone Encounter (Signed)
error 

## 2021-12-12 DEATH — deceased

## 2022-02-09 ENCOUNTER — Ambulatory Visit: Payer: Medicare Other | Admitting: Podiatry

## 2022-03-03 ENCOUNTER — Ambulatory Visit: Payer: Medicare Other | Admitting: Family

## 2022-05-31 ENCOUNTER — Encounter: Payer: Medicare Other | Admitting: Family

## 2023-11-17 ENCOUNTER — Telehealth: Payer: Medicare Other | Admitting: *Deleted

## 2023-11-17 NOTE — Telephone Encounter (Signed)
Patient's daughter, Lawson Fiscal called and left message on Clinical in take and stated that she is handling patient's estate and dealing with an attorney regarding taxes and stated that she would like to talk with [employer].   I tried to call patient daughter back to get more information but voicemail was not set up and could not leave message  Daughter is requesting a call from [name] regarding patient's estate, please call #[phone]

## 2023-11-17 NOTE — Telephone Encounter (Signed)
Noted  

## 2023-11-17 NOTE — Telephone Encounter (Signed)
Please schedule appointment to discuss concerns

## 2023-11-17 NOTE — Telephone Encounter (Signed)
We cannot schedule an appointment because patient is deceased.
